# Patient Record
Sex: Female | Born: 1990 | Race: White | Hispanic: No | Marital: Single | Smoking: Former smoker
Health system: Southern US, Community
[De-identification: ages and names within clinical notes are randomized; demographics above are authoritative.]

## PROBLEM LIST (undated history)

## (undated) ENCOUNTER — Inpatient Hospital Stay (HOSPITAL_COMMUNITY): Payer: Self-pay

## (undated) ENCOUNTER — Inpatient Hospital Stay (HOSPITAL_COMMUNITY): Payer: Managed Care, Other (non HMO)

## (undated) DIAGNOSIS — E669 Obesity, unspecified: Secondary | ICD-10-CM

## (undated) DIAGNOSIS — F329 Major depressive disorder, single episode, unspecified: Secondary | ICD-10-CM

## (undated) DIAGNOSIS — F32A Depression, unspecified: Secondary | ICD-10-CM

## (undated) DIAGNOSIS — F419 Anxiety disorder, unspecified: Secondary | ICD-10-CM

## (undated) DIAGNOSIS — R519 Headache, unspecified: Secondary | ICD-10-CM

## (undated) DIAGNOSIS — R87629 Unspecified abnormal cytological findings in specimens from vagina: Secondary | ICD-10-CM

## (undated) DIAGNOSIS — K047 Periapical abscess without sinus: Secondary | ICD-10-CM

## (undated) DIAGNOSIS — E282 Polycystic ovarian syndrome: Secondary | ICD-10-CM

## (undated) DIAGNOSIS — F319 Bipolar disorder, unspecified: Secondary | ICD-10-CM

## (undated) DIAGNOSIS — B999 Unspecified infectious disease: Secondary | ICD-10-CM

## (undated) HISTORY — PX: OTHER SURGICAL HISTORY: SHX169

## (undated) HISTORY — PX: TONSILLECTOMY: SUR1361

---
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
## 2012-06-14 ENCOUNTER — Emergency Department: Payer: Self-pay | Admitting: Emergency Medicine

## 2012-07-16 ENCOUNTER — Encounter (HOSPITAL_COMMUNITY): Payer: Self-pay | Admitting: *Deleted

## 2012-07-16 ENCOUNTER — Inpatient Hospital Stay (HOSPITAL_COMMUNITY)
Admission: AD | Admit: 2012-07-16 | Discharge: 2012-07-16 | Disposition: A | Discharge status: Home / Self Care | Payer: Managed Care, Other (non HMO) | Source: Ambulatory Visit | Attending: Obstetrics and Gynecology | Admitting: Obstetrics and Gynecology

## 2012-07-16 ENCOUNTER — Inpatient Hospital Stay (HOSPITAL_COMMUNITY): Payer: Managed Care, Other (non HMO)

## 2012-07-16 DIAGNOSIS — R109 Unspecified abdominal pain: Secondary | ICD-10-CM

## 2012-07-16 DIAGNOSIS — O99891 Other specified diseases and conditions complicating pregnancy: Secondary | ICD-10-CM | POA: Insufficient documentation

## 2012-07-16 DIAGNOSIS — O9989 Other specified diseases and conditions complicating pregnancy, childbirth and the puerperium: Secondary | ICD-10-CM

## 2012-07-16 DIAGNOSIS — N949 Unspecified condition associated with female genital organs and menstrual cycle: Secondary | ICD-10-CM | POA: Insufficient documentation

## 2012-07-16 HISTORY — DX: Polycystic ovarian syndrome: E28.2

## 2012-07-16 HISTORY — DX: Obesity, unspecified: E66.9

## 2012-07-16 HISTORY — DX: Periapical abscess without sinus: K04.7

## 2012-07-16 LAB — URINE MICROSCOPIC-ADD ON

## 2012-07-16 LAB — URINALYSIS, ROUTINE W REFLEX MICROSCOPIC
Bilirubin Urine: NEGATIVE
Glucose, UA: NEGATIVE mg/dL
Hgb urine dipstick: NEGATIVE
Nitrite: NEGATIVE
Specific Gravity, Urine: >1.03 — ABNORMAL HIGH (ref 1.005–1.030)
pH: 6 (ref 5.0–8.0)

## 2012-07-16 LAB — CBC
MCV: 87.6 fL (ref 78.0–100.0)
Platelets: 364 10*3/uL (ref 150–400)
RBC: 4.29 MIL/uL (ref 3.87–5.11)
WBC: 10.5 10*3/uL (ref 4.0–10.5)

## 2012-07-16 LAB — WET PREP, GENITAL: Yeast Wet Prep HPF POC: NONE SEEN

## 2012-07-16 NOTE — MAU Provider Note (Signed)
History     CSN: [id number]  Arrival date and time: 07/16/12 1251   First Provider Initiated Contact with Patient 07/16/12 1333      Chief Complaint  Patient presents with  . Abdominal Cramping  . Vaginal swelling    HPI 22 y.o. G1P0 at [redacted]w[redacted]d by LMP with cramping, vaginal discharge, itching and irritation. No bleeding.  Past Medical History  Diagnosis Date  . Obesity   . PCOS (polycystic ovarian syndrome)     "When I was young". No medical intervention.  . Infected tooth     Past Surgical History  Procedure Laterality Date  . Tonsillectomy      History reviewed. No pertinent family history.  History  Substance Use Topics  . Smoking status: Former Smoker    Types: Cigarettes  . Smokeless tobacco: Never Used  . Alcohol Use: Yes     Comment: Rare    Allergies: No Known Allergies  Prescriptions prior to admission  Medication Sig Dispense Refill  . [DISCONTINUED] Aspirin-Salicylamide-Caffeine (BC HEADACHE POWDER PO) Take 1 Package by mouth 4 (four) times daily as needed (toothache).        Review of Systems  Constitutional: Negative.   Respiratory: Negative.   Cardiovascular: Negative.   Gastrointestinal: Positive for abdominal pain. Negative for nausea, vomiting, diarrhea and constipation.  Genitourinary: Negative for dysuria, urgency, frequency, hematuria and flank pain.       Negative for vaginal bleeding, + cramping  Musculoskeletal: Negative.   Neurological: Negative.   Psychiatric/Behavioral: Negative.    Physical Exam   Blood pressure 116/60, pulse 88, temperature 98.9 F (37.2 C), temperature source Oral, resp. rate 18, height 5\' 6"  (1.676 m), weight 261 lb (118.389 kg), last menstrual period 06/09/2012.  Physical Exam  Nursing note and vitals reviewed. Constitutional: She is oriented to person, place, and time. She appears well-developed and well-nourished. No distress.  Obese   HENT:  Head: Normocephalic and atraumatic.  Cardiovascular:  Normal rate.   Respiratory: Effort normal. No respiratory distress.  GI: Soft. She exhibits no distension and no mass. There is no tenderness. There is no rebound and no guarding.  Genitourinary: There is no rash or lesion on the right labia. There is no rash or lesion on the left labia. Uterus is not deviated, not enlarged, not fixed and not tender. Cervix exhibits no motion tenderness, no discharge and no friability. Right adnexum displays no mass, no tenderness and no fullness. Left adnexum displays no mass, no tenderness and no fullness. No erythema, tenderness or bleeding around the vagina. Vaginal discharge (white, bubbly) found.  Neurological: She is alert and oriented to person, place, and time.  Skin: Skin is warm and dry.  Psychiatric: She has a normal mood and affect.    MAU Course  Procedures  Results for orders placed during the hospital encounter of 07/16/12 (from the past 72 hour(s))  URINALYSIS, ROUTINE W REFLEX MICROSCOPIC     Status: Abnormal   Collection Time    07/16/12  1:00 PM      Result Value Range   Color, Urine YELLOW  YELLOW   APPearance HAZY (*) CLEAR   Specific Gravity, Urine >1.030 (*) 1.005 - 1.030   pH 6.0  5.0 - 8.0   Glucose, UA NEGATIVE  NEGATIVE mg/dL   Hgb urine dipstick NEGATIVE  NEGATIVE   Bilirubin Urine NEGATIVE  NEGATIVE   Ketones, ur NEGATIVE  NEGATIVE mg/dL   Protein, ur NEGATIVE  NEGATIVE mg/dL   Urobilinogen, UA 0.2  0.0 - 1.0 mg/dL   Nitrite NEGATIVE  NEGATIVE   Leukocytes, UA SMALL (*) NEGATIVE  URINE MICROSCOPIC-ADD ON     Status: Abnormal   Collection Time    07/16/12  1:00 PM      Result Value Range   Squamous Epithelial / LPF FEW (*) RARE   WBC, UA 3-6  <3 WBC/hpf   RBC / HPF 0-2  <3 RBC/hpf   Bacteria, UA MANY (*) RARE   Urine-Other MUCOUS PRESENT    POCT PREGNANCY, URINE     Status: Abnormal   Collection Time    07/16/12  1:22 PM      Result Value Range   Preg Test, Ur POSITIVE (*) NEGATIVE   Comment:            THE  SENSITIVITY OF THIS     METHODOLOGY IS >24 mIU/mL  WET PREP, GENITAL     Status: Abnormal   Collection Time    07/16/12  1:35 PM      Result Value Range   Yeast Wet Prep HPF POC NONE SEEN  NONE SEEN   Trich, Wet Prep NONE SEEN  NONE SEEN   Clue Cells Wet Prep HPF POC NONE SEEN  NONE SEEN   WBC, Wet Prep HPF POC MANY (*) NONE SEEN   Comment: MANY BACTERIA SEEN  CBC     Status: None   Collection Time    07/16/12  1:36 PM      Result Value Range   WBC 10.5  4.0 - 10.5 K/uL   RBC 4.29  3.87 - 5.11 MIL/uL   Hemoglobin 12.8  12.0 - 15.0 g/dL   HCT 78.2  95.6 - 21.3 %   MCV 87.6  78.0 - 100.0 fL   MCH 29.8  26.0 - 34.0 pg   MCHC 34.0  30.0 - 36.0 g/dL   RDW 08.6  57.8 - 46.9 %   Platelets 364  150 - 400 K/uL  HCG, QUANTITATIVE, PREGNANCY     Status: Abnormal   Collection Time    07/16/12  1:36 PM      Result Value Range   hCG, Beta Chain, Quant, S 1080 (*) <5 mIU/mL   Comment:              GEST. AGE      CONC.  (mIU/mL)       <=1 WEEK        5 - 50         2 WEEKS       50 - 500         3 WEEKS       100 - 10,000         4 WEEKS     1,000 - 30,000         5 WEEKS     3,500 - 115,000       6-8 WEEKS     12,000 - 270,000        12 WEEKS     15,000 - 220,000                FEMALE AND NON-PREGNANT FEMALE:         LESS THAN 5 mIU/mL     Assessment and Plan   1. Abdominal pain in pregnancy, antepartum   Precautions rev'd, f/u in 48 hours for repeat quant    Medication List    STOP taking these medications  BC HEADACHE POWDER PO        Follow-up Information   Follow up with [facility] [place] [facility]MISS[facility] On 07/19/2012. (for repeat labs)    Contact information:   [address] [id number] [place] [address] [phone]        [facility] 07/16/2012, 2:48 PM

## 2012-07-16 NOTE — MAU Note (Signed)
+   HPT X 2. C/O abdominal cramping, fatigue, constipation. C/O vaginal "swelling and itching." Burns with urination.

## 2012-07-17 LAB — URINE CULTURE

## 2012-07-17 LAB — GC/CHLAMYDIA PROBE AMP: GC Probe RNA: NEGATIVE

## 2012-07-17 NOTE — MAU Provider Note (Signed)
Attestation of Attending Supervision of Advanced Practitioner (CNM/NP): Evaluation and management procedures were performed by the Advanced Practitioner under my supervision and collaboration.  I have reviewed the Advanced Practitioner's note and chart, and I agree with the management and plan.  Sarha Bartelt 07/17/2012 7:21 AM

## 2012-07-21 ENCOUNTER — Ambulatory Visit (HOSPITAL_COMMUNITY)
Admission: AD | Admit: 2012-07-21 | Discharge: 2012-07-21 | Disposition: A | Discharge status: Home / Self Care | Payer: Managed Care, Other (non HMO) | Source: Ambulatory Visit | Attending: Obstetrics & Gynecology | Admitting: Obstetrics & Gynecology

## 2012-07-21 DIAGNOSIS — R109 Unspecified abdominal pain: Secondary | ICD-10-CM | POA: Insufficient documentation

## 2012-07-21 DIAGNOSIS — O210 Mild hyperemesis gravidarum: Secondary | ICD-10-CM | POA: Insufficient documentation

## 2012-07-21 DIAGNOSIS — O99891 Other specified diseases and conditions complicating pregnancy: Secondary | ICD-10-CM | POA: Insufficient documentation

## 2012-07-21 DIAGNOSIS — Z3201 Encounter for pregnancy test, result positive: Secondary | ICD-10-CM

## 2012-07-21 NOTE — MAU Note (Signed)
Here for F/U. Denies bleeding. States she is still cramping intermittently like a period.

## 2012-07-22 ENCOUNTER — Ambulatory Visit (HOSPITAL_COMMUNITY)
Admission: AD | Admit: 2012-07-22 | Discharge: 2012-07-22 | Disposition: A | Discharge status: Home / Self Care | Payer: Managed Care, Other (non HMO) | Source: Ambulatory Visit | Attending: Obstetrics & Gynecology | Admitting: Obstetrics & Gynecology

## 2012-07-22 ENCOUNTER — Encounter (HOSPITAL_COMMUNITY): Payer: Self-pay | Admitting: *Deleted

## 2012-07-22 DIAGNOSIS — Z3689 Encounter for other specified antenatal screening: Secondary | ICD-10-CM

## 2012-07-22 DIAGNOSIS — O26899 Other specified pregnancy related conditions, unspecified trimester: Secondary | ICD-10-CM

## 2012-07-22 DIAGNOSIS — O219 Vomiting of pregnancy, unspecified: Secondary | ICD-10-CM

## 2012-07-22 DIAGNOSIS — O9989 Other specified diseases and conditions complicating pregnancy, childbirth and the puerperium: Secondary | ICD-10-CM

## 2012-07-22 DIAGNOSIS — R109 Unspecified abdominal pain: Secondary | ICD-10-CM

## 2012-07-22 DIAGNOSIS — O3680X0 Pregnancy with inconclusive fetal viability, not applicable or unspecified: Secondary | ICD-10-CM

## 2012-07-22 LAB — URINALYSIS, ROUTINE W REFLEX MICROSCOPIC
Glucose, UA: NEGATIVE mg/dL
Hgb urine dipstick: NEGATIVE
Protein, ur: NEGATIVE mg/dL
pH: 8 (ref 5.0–8.0)

## 2012-07-22 LAB — URINE MICROSCOPIC-ADD ON

## 2012-07-22 MED ORDER — ONDANSETRON HCL 4 MG PO TABS: 8.0000 mg | ORAL_TABLET | Freq: Once | ORAL | Status: DC

## 2012-07-22 MED ORDER — VITAMIN B-6 100 MG PO TABS: 100.0000 mg | ORAL_TABLET | Freq: Every day | ORAL | Status: DC

## 2012-07-22 MED ORDER — PROMETHAZINE HCL 25 MG PO TABS: 25.0000 mg | ORAL_TABLET | Freq: Four times a day (QID) | ORAL | Status: DC | PRN

## 2012-07-22 MED ORDER — DOXYLAMINE SUCCINATE (SLEEP) 25 MG PO TABS: 25.0000 mg | ORAL_TABLET | Freq: Every evening | ORAL | Status: DC | PRN

## 2012-07-22 NOTE — MAU Provider Note (Signed)
History     CSN: [id number]  Arrival date and time: 07/22/12 1006   First Provider Initiated Contact with Patient 07/22/12 1056      Chief Complaint  Patient presents with  . Morning Sickness  . Headache  . Abdominal Cramping   HPI This is a 22 y.o. female at [redacted]w[redacted]d  Who presents with c/o vomiting yesterday. States has nausea today with some headache and cramping. Was seen here 6 days ago for cramping and had a full workup. Quants increased appropriately. Has not tried anything for nausea.   RN Note: Patient states she started vomiting yesterday about 1200. No vomiting at this time but feels nauseated, abdominal cramping and headache  OB History   Grav Para Term Preterm Abortions TAB SAB Ect Mult Living   1         0      Past Medical History  Diagnosis Date  . Obesity   . PCOS (polycystic ovarian syndrome)     "When I was young". No medical intervention.  . Infected tooth     Past Surgical History  Procedure Laterality Date  . Tonsillectomy      Family History  Problem Relation Age of Onset  . Diabetes Paternal Grandmother   . Diabetes Paternal Grandfather     History  Substance Use Topics  . Smoking status: Former Smoker    Types: Cigarettes  . Smokeless tobacco: Never Used  . Alcohol Use: Yes     Comment: Rare    Allergies: No Known Allergies  Prescriptions prior to admission  Medication Sig Dispense Refill  . acetaminophen (TYLENOL) 500 MG tablet Take 2,000 mg by mouth once.      . calcium carbonate (TUMS - DOSED IN MG ELEMENTAL CALCIUM) 500 MG chewable tablet Chew 1-2 tablets by mouth 2 (two) times daily as needed for heartburn.      . Prenatal Vit-Fe Fumarate-FA (PRENATAL MULTIVITAMIN) TABS Take 1 tablet by mouth daily at 12 noon.        Review of Systems  Constitutional: Negative for fever, chills and malaise/fatigue.  Gastrointestinal: Positive for nausea, vomiting and abdominal pain. Negative for diarrhea and constipation.  Genitourinary:  Negative for dysuria.  Neurological: Negative for dizziness.   Physical Exam   Blood pressure 123/74, pulse 80, temperature 98.2 F (36.8 C), temperature source Oral, resp. rate 18, height 5' 6.5" (1.689 m), weight 115.486 kg (254 lb 9.6 oz), last menstrual period 06/09/2012, SpO2 98.00%.  Physical Exam  Constitutional: She is oriented to person, place, and time. She appears well-developed and well-nourished. No distress.  HENT:  Head: Normocephalic.  Cardiovascular: Normal rate.   Respiratory: Effort normal.  GI: Soft. She exhibits no distension. There is no tenderness. There is no rebound and no guarding.  Musculoskeletal: Normal range of motion.  Neurological: She is alert and oriented to person, place, and time.  Skin: Skin is warm and dry.  Psychiatric: She has a normal mood and affect.    MAU Course  Procedures  MDM Results for orders placed during the hospital encounter of 07/22/12 (from the past 24 hour(s))  URINALYSIS, ROUTINE W REFLEX MICROSCOPIC     Status: Abnormal   Collection Time    07/22/12 10:19 AM      Result Value Range   Color, Urine YELLOW  YELLOW   APPearance CLOUDY (*) CLEAR   Specific Gravity, Urine 1.025  1.005 - 1.030   pH 8.0  5.0 - 8.0   Glucose, UA NEGATIVE  NEGATIVE mg/dL   Hgb urine dipstick NEGATIVE  NEGATIVE   Bilirubin Urine NEGATIVE  NEGATIVE   Ketones, ur NEGATIVE  NEGATIVE mg/dL   Protein, ur NEGATIVE  NEGATIVE mg/dL   Urobilinogen, UA 0.2  0.0 - 1.0 mg/dL   Nitrite NEGATIVE  NEGATIVE   Leukocytes, UA SMALL (*) NEGATIVE  URINE MICROSCOPIC-ADD ON     Status: Abnormal   Collection Time    07/22/12 10:19 AM      Result Value Range   Squamous Epithelial / LPF MANY (*) RARE   WBC, UA 0-2  <3 WBC/hpf   Bacteria, UA FEW (*) RARE   Given Zofran for nausea with improvement.   Assessment and Plan  A:  SIUP at 108w1d        Nausea of pregnancy  P:  Discharge home         Medication List         acetaminophen 500 MG tablet   Commonly known as:  TYLENOL  Take 2,000 mg by mouth once.     calcium carbonate 500 MG chewable tablet  Commonly known as:  TUMS - dosed in mg elemental calcium  Chew 1-2 tablets by mouth 2 (two) times daily as needed for heartburn.     doxylamine (Sleep) 25 MG tablet  Commonly known as:  UNISOM  Take 1 tablet (25 mg total) by mouth at bedtime as needed for sleep.     prenatal multivitamin Tabs  Take 1 tablet by mouth daily at 12 noon.     promethazine 25 MG tablet  Commonly known as:  PHENERGAN  Take 1 tablet (25 mg total) by mouth every 6 (six) hours as needed for nausea.     pyridOXINE 100 MG tablet  Commonly known as:  VITAMIN B-6  Take 1 tablet (100 mg total) by mouth daily.         [facility] 07/22/2012, 10:56 AM

## 2012-07-22 NOTE — MAU Note (Signed)
Patient states she started vomiting yesterday about 1200. No vomiting at this time but feels nauseated, abdominal cramping and headache.

## 2012-07-23 NOTE — MAU Provider Note (Signed)
Attestation of Attending Supervision of Advanced Practitioner (CNM/NP): Evaluation and management procedures were performed by the Advanced Practitioner under my supervision and collaboration. I have reviewed the Advanced Practitioner's note and chart, and I agree with the management and plan.  Maily Debarge H. 1:32 PM   

## 2012-07-25 ENCOUNTER — Encounter (HOSPITAL_COMMUNITY): Payer: Self-pay | Admitting: Advanced Practice Midwife

## 2012-07-25 ENCOUNTER — Ambulatory Visit (HOSPITAL_COMMUNITY)
Admit: 2012-07-25 | Discharge: 2012-07-25 | Disposition: A | Discharge status: Home / Self Care | Payer: Managed Care, Other (non HMO) | Attending: Advanced Practice Midwife | Admitting: Advanced Practice Midwife

## 2012-07-25 ENCOUNTER — Inpatient Hospital Stay (HOSPITAL_COMMUNITY)
Admission: AD | Admit: 2012-07-25 | Discharge: 2012-07-25 | Disposition: A | Discharge status: Home / Self Care | Payer: Managed Care, Other (non HMO) | Source: Ambulatory Visit | Attending: Obstetrics & Gynecology | Admitting: Obstetrics & Gynecology

## 2012-07-25 ENCOUNTER — Ambulatory Visit (HOSPITAL_COMMUNITY): Admit: 2012-07-25 | Payer: Managed Care, Other (non HMO)

## 2012-07-25 DIAGNOSIS — Z363 Encounter for antenatal screening for malformations: Secondary | ICD-10-CM

## 2012-07-25 DIAGNOSIS — O99891 Other specified diseases and conditions complicating pregnancy: Secondary | ICD-10-CM | POA: Insufficient documentation

## 2012-07-25 DIAGNOSIS — Z1389 Encounter for screening for other disorder: Secondary | ICD-10-CM

## 2012-07-25 DIAGNOSIS — Z3490 Encounter for supervision of normal pregnancy, unspecified, unspecified trimester: Secondary | ICD-10-CM

## 2012-07-25 DIAGNOSIS — Z3201 Encounter for pregnancy test, result positive: Secondary | ICD-10-CM

## 2012-07-25 NOTE — MAU Provider Note (Signed)
Attestation of Attending Supervision of Advanced Practitioner (PA/CNM/NP): Evaluation and management procedures were performed by the Advanced Practitioner under my supervision and collaboration.  I have reviewed the Advanced Practitioner's note and chart, and I agree with the management and plan.  Klaryssa Fauth, MD, FACOG Attending Obstetrician & Gynecologist Faculty Practice, [facility]  

## 2012-07-25 NOTE — MAU Provider Note (Signed)
22 y.o. G1P0 at [redacted]w[redacted]d here for repeat u/s for viability, no complaints today. Denies pain or bleeding.   U/S today shows 5.5 week IUP, + FHR.   A/P: 22 y.o. G1P0 at [redacted]w[redacted]d with viable IUP Start prenatal care ASAP Precautions rev'd  Malva Diesing 07/25/12, 4:43 PM

## 2012-08-02 ENCOUNTER — Emergency Department: Payer: Self-pay | Admitting: Emergency Medicine

## 2012-08-02 LAB — COMPREHENSIVE METABOLIC PANEL
Albumin: 3.7 g/dL (ref 3.4–5.0)
Anion Gap: 7 (ref 7–16)
Bilirubin,Total: 0.4 mg/dL (ref 0.2–1.0)
Calcium, Total: 9.4 mg/dL (ref 8.5–10.1)
Chloride: 107 mmol/L (ref 98–107)
Co2: 23 mmol/L (ref 21–32)
EGFR (African American): >60
Osmolality: 272 (ref 275–301)
Potassium: 3.3 mmol/L — ABNORMAL LOW (ref 3.5–5.1)
SGOT(AST): 14 U/L — ABNORMAL LOW (ref 15–37)
SGPT (ALT): 12 U/L (ref 12–78)
Sodium: 137 mmol/L (ref 136–145)

## 2012-08-02 LAB — CBC WITH DIFFERENTIAL/PLATELET
Basophil #: 0.1 10*3/uL (ref 0.0–0.1)
Basophil %: 0.4 %
Eosinophil #: 0.1 10*3/uL (ref 0.0–0.7)
HCT: 41.1 % (ref 35.0–47.0)
HGB: 13.3 g/dL (ref 12.0–16.0)
Lymphocyte #: 1.8 10*3/uL (ref 1.0–3.6)
Lymphocyte %: 11 %
MCV: 88 fL (ref 80–100)
Monocyte #: 1 x10 3/mm — ABNORMAL HIGH (ref 0.2–0.9)
Monocyte %: 5.9 %
Neutrophil %: 81.9 %
Platelet: 395 10*3/uL (ref 150–440)
RBC: 4.66 10*6/uL (ref 3.80–5.20)

## 2012-08-02 LAB — HCG, QUANTITATIVE, PREGNANCY: Beta Hcg, Quant.: 48696 m[IU]/mL — ABNORMAL HIGH

## 2012-08-02 LAB — URINALYSIS, COMPLETE
Bilirubin,UR: NEGATIVE
Blood: NEGATIVE
Glucose,UR: NEGATIVE mg/dL (ref 0–75)
Nitrite: NEGATIVE
Ph: 8 (ref 4.5–8.0)
Specific Gravity: 1.026 (ref 1.003–1.030)

## 2012-08-10 ENCOUNTER — Encounter (HOSPITAL_COMMUNITY): Payer: Self-pay | Admitting: *Deleted

## 2012-08-10 ENCOUNTER — Inpatient Hospital Stay (HOSPITAL_COMMUNITY)
Admission: AD | Admit: 2012-08-10 | Discharge: 2012-08-10 | Disposition: A | Discharge status: Home / Self Care | Payer: Managed Care, Other (non HMO) | Source: Ambulatory Visit | Attending: Obstetrics and Gynecology | Admitting: Obstetrics and Gynecology

## 2012-08-10 DIAGNOSIS — O219 Vomiting of pregnancy, unspecified: Secondary | ICD-10-CM

## 2012-08-10 DIAGNOSIS — R42 Dizziness and giddiness: Secondary | ICD-10-CM | POA: Insufficient documentation

## 2012-08-10 DIAGNOSIS — O210 Mild hyperemesis gravidarum: Secondary | ICD-10-CM

## 2012-08-10 DIAGNOSIS — R109 Unspecified abdominal pain: Secondary | ICD-10-CM | POA: Insufficient documentation

## 2012-08-10 LAB — URINALYSIS, ROUTINE W REFLEX MICROSCOPIC
Ketones, ur: 15 mg/dL — AB
Leukocytes, UA: NEGATIVE
Nitrite: NEGATIVE
Protein, ur: NEGATIVE mg/dL
Urobilinogen, UA: 0.2 mg/dL (ref 0.0–1.0)

## 2012-08-10 MED ORDER — PROMETHAZINE HCL 25 MG/ML IJ SOLN
25.0000 mg | Freq: Once | INTRAMUSCULAR | Status: AC
  Administered 2012-08-10: 25 mg via INTRAVENOUS
  Filled 2012-08-10: qty 1

## 2012-08-10 MED ORDER — DEXTROSE 5 % IN LACTATED RINGERS IV BOLUS
1000.0000 mL | Freq: Once | INTRAVENOUS | Status: AC
  Administered 2012-08-10: 1000 mL via INTRAVENOUS

## 2012-08-10 MED ORDER — ONDANSETRON 8 MG/NS 50 ML IVPB
8.0000 mg | Freq: Once | INTRAVENOUS | Status: AC
  Administered 2012-08-10: 8 mg via INTRAVENOUS
  Filled 2012-08-10: qty 8

## 2012-08-10 MED ORDER — ONDANSETRON 8 MG PO TBDP: 8.0000 mg | ORAL_TABLET | Freq: Three times a day (TID) | ORAL | Status: DC | PRN

## 2012-08-10 NOTE — MAU Provider Note (Signed)
  History     CSN: [id number]  Arrival date and time: 08/10/12 1831   First Provider Initiated Contact with Patient 08/10/12 1846      Chief Complaint  Patient presents with  . Emesis During Pregnancy   HPI 22 y.o. G1P0 at [redacted]w[redacted]d with n/v, ongoing this preg, worse today. Was better with Zofran, but ran out. Actively vomiting.   Past Medical History  Diagnosis Date  . Obesity   . PCOS (polycystic ovarian syndrome)     "When I was young". No medical intervention.  . Infected tooth     Past Surgical History  Procedure Laterality Date  . Tonsillectomy      Family History  Problem Relation Age of Onset  . Diabetes Paternal Grandmother   . Diabetes Paternal Grandfather     History  Substance Use Topics  . Smoking status: Former Smoker    Types: Cigarettes  . Smokeless tobacco: Never Used  . Alcohol Use: Yes     Comment: Rare    Allergies: No Known Allergies  Prescriptions prior to admission  Medication Sig Dispense Refill  . acetaminophen (TYLENOL) 500 MG tablet Take 2,000 mg by mouth once.      . calcium carbonate (TUMS - DOSED IN MG ELEMENTAL CALCIUM) 500 MG chewable tablet Chew 1-2 tablets by mouth 2 (two) times daily as needed for heartburn.      . ondansetron (ZOFRAN) 4 MG tablet Take 4 mg by mouth every 8 (eight) hours as needed for nausea.      Marland Kitchen doxylamine, Sleep, (UNISOM) 25 MG tablet Take 1 tablet (25 mg total) by mouth at bedtime as needed for sleep.  30 tablet  0  . Prenatal Vit-Fe Fumarate-FA (PRENATAL MULTIVITAMIN) TABS Take 1 tablet by mouth daily at 12 noon.      . pyridOXINE (VITAMIN B-6) 100 MG tablet Take 1 tablet (100 mg total) by mouth daily.  30 tablet  0    Review of Systems  Constitutional: Negative.   Respiratory: Negative.   Cardiovascular: Negative.   Gastrointestinal: Positive for nausea, vomiting and abdominal pain. Negative for diarrhea and constipation.  Genitourinary: Negative for dysuria, urgency, frequency, hematuria and flank  pain.  Musculoskeletal: Negative.   Neurological: Positive for dizziness.  Psychiatric/Behavioral: Negative.    Physical Exam   Blood pressure 130/77, pulse 97, temperature 97.8 F (36.6 C), temperature source Oral, resp. rate 20, height 5\' 6"  (1.676 m), weight 254 lb (115.214 kg), last menstrual period 06/09/2012.  Physical Exam  Nursing note and vitals reviewed. Constitutional: She is oriented to person, place, and time. She appears well-developed and well-nourished. Distressed: actively vomiting.  Musculoskeletal: Normal range of motion.  Neurological: She is alert and oriented to person, place, and time.  Skin: Skin is dry.  Psychiatric: She has a normal mood and affect.    MAU Course  Procedures UA pending  Bolus 1 L D5LR, Zofran 8 mg and Phenergan 25 mg IV 2029: Patient is feeling better, and would like to go home. She needs a RX for zofran  Assessment and Plan  Care assumed by Thressa Sheller, CNM  1. Nausea and vomiting in pregnancy prior to [redacted] weeks gestation    RX: zofran 8mg  q 8 hours PRN #30 0RF Start [facility] as soon as possible First trimester danger signs reviewed   FRAZIER,NATALIE 08/10/2012, 7:28 PM

## 2012-08-10 NOTE — MAU Note (Addendum)
Pt in c/o nausea and vomiting since this morning.  States she is unable to keep anything down.  Reports dizziness and lightheadedness.  Denies any bleeding or lof.  [facility] 03/22/13.  Was given nausea medicine last week at [facility], but has now ran out.  Reports upper abdominal pain from vomiting.

## 2012-08-10 NOTE — MAU Provider Note (Signed)
Attestation of Attending Supervision of Advanced Practitioner: Evaluation and management procedures were performed by the PA/NP/CNM/OB Fellow under my supervision/collaboration. Chart reviewed and agree with management and plan.  Dimple Bastyr V 08/10/2012 9:40 PM  Pap done, will follow up results and manage accordingly. Mammogram scheduled Routine preventative health maintenance measures emphasized

## 2012-08-17 ENCOUNTER — Encounter (HOSPITAL_COMMUNITY): Payer: Self-pay | Admitting: Family

## 2012-08-17 ENCOUNTER — Inpatient Hospital Stay (HOSPITAL_COMMUNITY)
Admission: AD | Admit: 2012-08-17 | Discharge: 2012-08-17 | Disposition: A | Discharge status: Home / Self Care | Payer: Managed Care, Other (non HMO) | Source: Ambulatory Visit | Attending: Family Medicine | Admitting: Family Medicine

## 2012-08-17 DIAGNOSIS — O219 Vomiting of pregnancy, unspecified: Secondary | ICD-10-CM

## 2012-08-17 DIAGNOSIS — O210 Mild hyperemesis gravidarum: Secondary | ICD-10-CM | POA: Insufficient documentation

## 2012-08-17 LAB — URINALYSIS, ROUTINE W REFLEX MICROSCOPIC
Bilirubin Urine: NEGATIVE
Ketones, ur: >80 mg/dL — AB
Nitrite: NEGATIVE
Protein, ur: NEGATIVE mg/dL

## 2012-08-17 MED ORDER — LACTATED RINGERS IV SOLN
INTRAVENOUS | Status: DC
Start: 2012-08-17 — End: 2012-08-17
  Administered 2012-08-17: 13:00:00 via INTRAVENOUS

## 2012-08-17 MED ORDER — PROMETHAZINE HCL 25 MG PO TABS: 25.0000 mg | ORAL_TABLET | Freq: Four times a day (QID) | ORAL | Status: DC | PRN

## 2012-08-17 MED ORDER — ONDANSETRON 8 MG PO TBDP
8.0000 mg | ORAL_TABLET | Freq: Once | ORAL | Status: AC
  Administered 2012-08-17: 8 mg via ORAL
  Filled 2012-08-17: qty 1

## 2012-08-17 NOTE — MAU Provider Note (Signed)
History     CSN: [id number]  Arrival date and time: 08/17/12 1100   First Provider Initiated Contact with Patient 08/17/12 1136      No chief complaint on file.  HPI This is a 22 y.o. female at [redacted]w[redacted]d who presents with c/o nausea and vomiting.  See RN Note. Has not started [facility] yet, but will probably go to [facility].   RN Note: Patient presents to MAU with c/o N/V since Saturday 0400 when she ran out of her sister's Rx for Zofran. Reports she was taking 4mg  x 2 once daily and was effectively managing her N/V.  Reports she has an Rx for Zofran but will be unable to pick that up until tomorrow morning d/t financial constraints. Denies VB.       OB History   Grav Para Term Preterm Abortions TAB SAB Ect Mult Living   1         0      Past Medical History  Diagnosis Date  . Obesity   . PCOS (polycystic ovarian syndrome)     "When I was young". No medical intervention.  . Infected tooth     Past Surgical History  Procedure Laterality Date  . Tonsillectomy      Family History  Problem Relation Age of Onset  . Diabetes Paternal Grandmother   . Diabetes Paternal Grandfather     History  Substance Use Topics  . Smoking status: Former Smoker    Types: Cigarettes  . Smokeless tobacco: Never Used  . Alcohol Use: Yes     Comment: Rare    Allergies: No Known Allergies  Prescriptions prior to admission  Medication Sig Dispense Refill  . anti-nausea (EMETROL) solution Take 10 mLs by mouth every 15 (fifteen) minutes as needed for nausea or vomiting.      . ondansetron (ZOFRAN) 4 MG tablet Take 4 mg by mouth every 8 (eight) hours as needed for nausea.      . Prenatal Vit-Fe Fumarate-FA (PRENATAL MULTIVITAMIN) TABS Take 1 tablet by mouth daily at 12 noon.        Review of Systems  Constitutional: Positive for malaise/fatigue. Negative for fever and chills.  Gastrointestinal: Positive for nausea and vomiting. Negative for abdominal pain, diarrhea and constipation.   Neurological: Positive for dizziness and weakness.   Physical Exam   Blood pressure 134/64, pulse 109, temperature 98 F (36.7 C), temperature source Oral, resp. rate 18, last menstrual period 06/09/2012, SpO2 100.00%.  Physical Exam  Constitutional: She is oriented to person, place, and time. She appears well-developed and well-nourished. No distress (but anxious).  Cardiovascular: Normal rate.   Respiratory: Effort normal.  GI: Soft. There is no tenderness.  Musculoskeletal: Normal range of motion.  Neurological: She is alert and oriented to person, place, and time.  Skin: Skin is warm and dry.  Psychiatric: She has a normal mood and affect.   Results for orders placed during the hospital encounter of 08/17/12 (from the past 24 hour(s))  URINALYSIS, ROUTINE W REFLEX MICROSCOPIC     Status: Abnormal   Collection Time    08/17/12 11:52 AM      Result Value Range   Color, Urine YELLOW  YELLOW   APPearance HAZY (*) CLEAR   Specific Gravity, Urine 1.020  1.005 - 1.030   pH 7.5  5.0 - 8.0   Glucose, UA NEGATIVE  NEGATIVE mg/dL   Hgb urine dipstick NEGATIVE  NEGATIVE   Bilirubin Urine NEGATIVE  NEGATIVE  Ketones, ur >80 (*) NEGATIVE mg/dL   Protein, ur NEGATIVE  NEGATIVE mg/dL   Urobilinogen, UA 0.2  0.0 - 1.0 mg/dL   Nitrite NEGATIVE  NEGATIVE   Leukocytes, UA SMALL (*) NEGATIVE  URINE MICROSCOPIC-ADD ON     Status: Abnormal   Collection Time    08/17/12 11:52 AM      Result Value Range   Squamous Epithelial / LPF MANY (*) RARE   WBC, UA 7-10  <3 WBC/hpf   RBC / HPF 0-2  <3 RBC/hpf   Bacteria, UA MANY (*) RARE    MAU Course  Procedures  MDM IV bolus of 1 liter given. Zofran given with complete relief. Wants to leave to go eat.   Assessment and Plan  A:  SIUP at [redacted]w[redacted]d       Nausea and vomiting, has not filled Rx  P:  Discharge home       Rx for Phenergan ($4 list) given.       Urged to fill Rx'es  [facility] 08/17/2012, 12:09 PM

## 2012-08-17 NOTE — MAU Provider Note (Signed)
Chart reviewed and agree with management and plan.  

## 2012-08-17 NOTE — MAU Note (Signed)
Patient presents to MAU with c/o N/V since Saturday 0400 when she ran out of her sister's Rx for Zofran. Reports she was taking 4mg  x 2 once daily and was effectively managing her N/V.  Reports she has an Rx for Zofran but will be unable to pick that up until tomorrow morning d/t financial constraints. Denies VB.

## 2012-08-18 LAB — URINE CULTURE: Colony Count: 40000

## 2012-08-19 ENCOUNTER — Other Ambulatory Visit: Payer: Self-pay | Admitting: Advanced Practice Midwife

## 2012-08-26 ENCOUNTER — Other Ambulatory Visit: Payer: Self-pay | Admitting: Obstetrics & Gynecology

## 2012-08-26 DIAGNOSIS — O3680X0 Pregnancy with inconclusive fetal viability, not applicable or unspecified: Secondary | ICD-10-CM

## 2012-08-27 ENCOUNTER — Ambulatory Visit (INDEPENDENT_AMBULATORY_CARE_PROVIDER_SITE_OTHER): Payer: Managed Care, Other (non HMO)

## 2012-08-27 ENCOUNTER — Ambulatory Visit (INDEPENDENT_AMBULATORY_CARE_PROVIDER_SITE_OTHER): Payer: Managed Care, Other (non HMO) | Admitting: Advanced Practice Midwife

## 2012-08-27 ENCOUNTER — Other Ambulatory Visit: Payer: Self-pay | Admitting: Obstetrics & Gynecology

## 2012-08-27 DIAGNOSIS — O3680X0 Pregnancy with inconclusive fetal viability, not applicable or unspecified: Secondary | ICD-10-CM

## 2012-08-27 DIAGNOSIS — O021 Missed abortion: Secondary | ICD-10-CM

## 2012-08-27 LAB — CBC
HCT: 40.4 % (ref 36.0–46.0)
MCHC: 33.7 g/dL (ref 30.0–36.0)
MCV: 89.6 fL (ref 78.0–100.0)
Platelets: 409 10*3/uL — ABNORMAL HIGH (ref 150–400)
RDW: 14.4 % (ref 11.5–15.5)
WBC: 11.1 10*3/uL — ABNORMAL HIGH (ref 4.0–10.5)

## 2012-08-27 NOTE — Patient Instructions (Addendum)
Dilation and Curettage or Vacuum Curettage Dilation and curettage (D&C) and vacuum curettage are minor procedures. A D&C involves stretching (dilation) the cervix and scraping (curettage) the inside lining of the womb (uterus). During a D&C, tissue is gently scraped from the inside lining of the uterus. During a vacuum curettage, the lining and tissue in the uterus are removed with the use of gentle suction. Curettage may be performed for diagnostic or therapeutic purposes. As a diagnostic procedure, curettage is performed for the purpose of examining tissues from the uterus. Tissue examination may help determine causes or treatment options for symptoms. A diagnostic curettage may be performed for the following symptoms:  Irregular bleeding in the uterus.  Bleeding with the development of clots.  Spotting between menstrual periods.  Prolonged menstrual periods.  Bleeding after menopause.  No menstrual period (amenorrhea).  A change in size and shape of the uterus. A therapeutic curettage is performed to remove tissue, blood, or a contraceptive device. Therapeutic curettage may be performed for the following conditions:   Removal of an IUD (intrauterine device).  Removal of retained placenta after giving birth. Retained placenta can cause bleeding severe enough to require transfusions or an infection.  Abortion.  Miscarriage.  Removal of polyps inside the uterus.  Removal of uncommon types of fibroids (noncancerous lumps). LET YOUR CAREGIVER KNOW ABOUT:   Allergies to food or medicine.  Medicines taken, including vitamins, herbs, eyedrops, over-the-counter medicines, and creams.  Use of steroids (by mouth or creams).  Previous problems with anesthetics or numbing medicines.  History of bleeding problems or blood clots.  Previous surgery.  Other health problems, including diabetes and kidney problems.  Possibility of pregnancy, if this applies. RISKS AND COMPLICATIONS    Excessive bleeding.  Infection of the uterus.  Damage to the cervix.  Development of scar tissue (adhesions) inside the uterus, later causing abnormal amounts of menstrual bleeding.  Complications from the general anesthetic, if a general anesthetic is used.  Putting a hole (perforation) in the uterus. This is rare. BEFORE THE PROCEDURE   Eat and drink before the procedure only as directed by your caregiver.  Arrange for someone to take you home. PROCEDURE   This procedure may be done in a hospital, outpatient clinic, or caregiver's office.  You may be given a general anesthetic or a local anesthetic in and around the cervix.  You will lie on your back with your legs in stirrups.  There are two ways in which your cervix can be softened and dilated. These include:  Taking a medicine.  Having thin rods (laminaria) inserted into your cervix.  A curved tool (curette) will scrape cells from the inside lining of the uterus and will then be removed. This procedure usually takes about 15 to 30 minutes. AFTER THE PROCEDURE   You will rest in the recovery area until you are stable and are ready to go home.  You will need to have someone take you home.  You may feel sick to your stomach (nauseous) or throw up (vomit) if you had general anesthesia.  You may have a sore throat if a tube was placed in your throat during general anesthesia.  You may have light cramping and bleeding for 2 days to 2 weeks after the procedure.  Your uterus needs to make a new lining after the procedure. This may make your next period late. Document Released: 01/01/2005 Document Revised: 03/26/2011 Document Reviewed: 07/30/2008 [facility] Patient Information 2014 [place], [place]. Incomplete Miscarriage Miscarriages in pregnancy are  common. A miscarriage is a pregnancy that has ended before the twentieth week. You have had an incomplete miscarriage. Partial parts of the fetus or placenta (afterbirth)  remain behind. Sometimes further treatment is needed. The most common reason for further treatment is continued bleeding (hemorrhage). Tissue left behind may also become infected. Treatment usually is curettage. Curettage for an incomplete abortion is a procedure in which the remaining products of pregnancy are removed. This can be done by a simple sucking procedure (suction curettage). It can also be done by a simple scraping (curettage) of the inside of the uterus (womb). This may be done in the hospital or in the caregiver's office. This is only done when your caregiver knows the pregnancy has ended. This is determined by physical examination and a negative pregnancy test. It may also include an ultrasound to confirm a dead fetus. The ultrasound may also prove that products of the pregnancy remain in the uterus. If your cervix remains dilated and you are still passing clots and tissue, your caregiver may wish to watch you for a little while. Your caregiver may want to see if you are going to finish passing all of the remaining parts of the pregnancy. If the bleeding continues, they may proceed with curettage. WHY DO I FEEL THIS WAY Miscarriages can be a very emotional time for prospective mothers. This is not you or your partner's fault. The miscarriage did not occur because of a lack in you or your partner. Nearly all miscarriages occur because the pregnancy has started off wrongly. At least half of miscarried pregnancies have a chromosomal abnormality (almost always not inherited). Others may have developmental problems with the fetus or placentas. Problems may not show up even when the products miscarried are studied under the microscope. You can usually begin trying for another pregnancy as soon as your caregiver says it's okay. HOME CARE INSTRUCTIONS   Your caregiver may order bed rest (this means only getting up to use the bathroom). Your caregiver may allow you to continue light activity. If  curettage was not done at this time, but you require further treatment.  Keep track of the number of pads you use each day. Keep track of how saturated (soaked) they are. Record this information.  Do not use tampons. Do not douche or have sexual intercourse until approved by your caregiver.  It is very important to keep all follow-up appointments for re-evaluation and continuing management.  Women who have an Rh negative blood type (ie, A, B, AB, or O negative) need to receive a drug called Rh(D) immune globulin. This medicine helps protect future fetuses against problems that can occur if an Rh negative mother is carrying a baby who is Rh positive. SEEK IMMEDIATE MEDICAL CARE IF:   You experience severe cramps in your stomach, back, or abdomen.  You run an unexplained temperature (record these).  You pass large clots or tissue (save any tissue for your caregiver to inspect).  Your bleeding increases or you become light-headed, weak, or have fainting episodes. MAKE SURE YOU:   Understand these instructions.  Will watch your condition.  Will get help right away if you are not doing well or get worse. Document Released: 01/01/2005 Document Revised: 03/26/2011 Document Reviewed: 08/22/2007 [facility] Patient Information 2014 [place], [place].   OPTIONS:   1. Wait (for up to 2 weeks) to see if a natural miscarriage will happen.  If if does not, please call office to request either option 2 or 3:  2.  Cytotec induction:  You take tablets (called cytotec) which usually induce a miscarriage within 24-28 hours.  Sometimes 2 doses are needed.  3.  Surgical D&C:  Please see above information

## 2012-08-27 NOTE — Progress Notes (Signed)
Sara Carpenter 22 y.o. G1 at 10.3 weeks based on 5 week [place] at [facility] (+ [facility]HR at that visit) presents for her new OB u/s.  US showed fetus measureing 8 weeks without cardiac activity.  Pt reports no bleeding/cramping.  CX closed.  Pt and FOB very upset.  Options explained (waiting up to 2 weeks, cytotec, D&C) and given in discharge instructions.  They will go home, digest, and get back with me.  ABO/CBC drawn today.  CRESENZO-DISHMAN,Zineb Glade

## 2012-08-28 LAB — ABO AND RH: Rh Type: POSITIVE

## 2012-09-02 ENCOUNTER — Encounter: Payer: Self-pay | Admitting: Obstetrics & Gynecology

## 2012-09-02 ENCOUNTER — Encounter (HOSPITAL_COMMUNITY)
Admission: RE | Admit: 2012-09-02 | Discharge: 2012-09-02 | Disposition: A | Discharge status: Home / Self Care | Payer: Managed Care, Other (non HMO) | Source: Ambulatory Visit | Attending: Obstetrics & Gynecology | Admitting: Obstetrics & Gynecology

## 2012-09-02 ENCOUNTER — Encounter (HOSPITAL_COMMUNITY): Payer: Self-pay

## 2012-09-02 ENCOUNTER — Ambulatory Visit (INDEPENDENT_AMBULATORY_CARE_PROVIDER_SITE_OTHER): Payer: Managed Care, Other (non HMO) | Admitting: Obstetrics & Gynecology

## 2012-09-02 VITALS — BP 130/70 | Wt 256.0 lb

## 2012-09-02 DIAGNOSIS — O021 Missed abortion: Secondary | ICD-10-CM

## 2012-09-02 HISTORY — DX: Bipolar disorder, unspecified: F31.9

## 2012-09-02 LAB — COMPREHENSIVE METABOLIC PANEL
ALT: 12 U/L (ref 0–35)
Alkaline Phosphatase: 55 U/L (ref 39–117)
BUN: 4 mg/dL — ABNORMAL LOW (ref 6–23)
CO2: 25 mEq/L (ref 19–32)
GFR calc Af Amer: >90 mL/min (ref >90)
GFR calc non Af Amer: >90 mL/min (ref >90)
Glucose, Bld: 104 mg/dL — ABNORMAL HIGH (ref 70–99)
Potassium: 4 mEq/L (ref 3.5–5.1)
Sodium: 138 mEq/L (ref 135–145)

## 2012-09-02 LAB — CBC
HCT: 37.5 % (ref 36.0–46.0)
Hemoglobin: 12.8 g/dL (ref 12.0–15.0)
MCH: 30.8 pg (ref 26.0–34.0)
MCHC: 34.1 g/dL (ref 30.0–36.0)
RBC: 4.16 MIL/uL (ref 3.87–5.11)

## 2012-09-02 LAB — URINALYSIS, ROUTINE W REFLEX MICROSCOPIC
Bilirubin Urine: NEGATIVE
Ketones, ur: NEGATIVE mg/dL
Nitrite: NEGATIVE
Protein, ur: NEGATIVE mg/dL
Urobilinogen, UA: 0.2 mg/dL (ref 0.0–1.0)

## 2012-09-02 NOTE — Patient Instructions (Addendum)
Sara Carpenter  09/02/2012   Your procedure is scheduled on:  09/03/2012  Report to [facility] at  945  AM.  Call this number if you have problems the morning of surgery: [phone]   Remember:   Do not eat food or drink liquids after midnight.   Take these medicines the morning of surgery with A SIP OF WATER: none   Do not wear jewelry, make-up or nail polish.  Do not wear lotions, powders, or perfumes.   Do not shave 48 hours prior to surgery. Men may shave face and neck.  Do not bring valuables to the hospital.  [facility] is not responsible for any belongings or valuables.  Contacts, dentures or bridgework may not be worn into surgery.  Leave suitcase in the car. After surgery it may be brought to your room.  For patients admitted to the hospital, checkout time is 11:00 AM the day of discharge.   Patients discharged the day of surgery will not be allowed to drive home.  Name and phone number of your driver: family  Special Instructions: Shower using CHG 2 nights before surgery and the night before surgery.  If you shower the day of surgery use CHG.  Use special wash - you have one bottle of CHG for all showers.  You should use approximately 1/3 of the bottle for each shower.   Please read over the following fact sheets that you were given: Pain Booklet, Coughing and Deep Breathing, MRSA Information, Surgical Site Infection Prevention, Anesthesia Post-op Instructions and Care and Recovery After Surgery Dilation and Curettage or Vacuum Curettage Dilation and curettage (D&C) and vacuum curettage are minor procedures. A D&C involves stretching (dilation) the cervix and scraping (curettage) the inside lining of the womb (uterus). During a D&C, tissue is gently scraped from the inside lining of the uterus. During a vacuum curettage, the lining and tissue in the uterus are removed with the use of gentle suction. Curettage may be performed for diagnostic or therapeutic purposes. As a  diagnostic procedure, curettage is performed for the purpose of examining tissues from the uterus. Tissue examination may help determine causes or treatment options for symptoms. A diagnostic curettage may be performed for the following symptoms:  Irregular bleeding in the uterus.  Bleeding with the development of clots.  Spotting between menstrual periods.  Prolonged menstrual periods.  Bleeding after menopause.  No menstrual period (amenorrhea).  A change in size and shape of the uterus. A therapeutic curettage is performed to remove tissue, blood, or a contraceptive device. Therapeutic curettage may be performed for the following conditions:   Removal of an IUD (intrauterine device).  Removal of retained placenta after giving birth. Retained placenta can cause bleeding severe enough to require transfusions or an infection.  Abortion.  Miscarriage.  Removal of polyps inside the uterus.  Removal of uncommon types of fibroids (noncancerous lumps). LET YOUR CAREGIVER KNOW ABOUT:   Allergies to food or medicine.  Medicines taken, including vitamins, herbs, eyedrops, over-the-counter medicines, and creams.  Use of steroids (by mouth or creams).  Previous problems with anesthetics or numbing medicines.  History of bleeding problems or blood clots.  Previous surgery.  Other health problems, including diabetes and kidney problems.  Possibility of pregnancy, if this applies. RISKS AND COMPLICATIONS   Excessive bleeding.  Infection of the uterus.  Damage to the cervix.  Development of scar tissue (adhesions) inside the uterus, later causing abnormal amounts of menstrual bleeding.  Complications  from the general anesthetic, if a general anesthetic is used.  Putting a hole (perforation) in the uterus. This is rare. BEFORE THE PROCEDURE   Eat and drink before the procedure only as directed by your caregiver.  Arrange for someone to take you home. PROCEDURE   This  procedure may be done in a hospital, outpatient clinic, or caregiver's office.  You may be given a general anesthetic or a local anesthetic in and around the cervix.  You will lie on your back with your legs in stirrups.  There are two ways in which your cervix can be softened and dilated. These include:  Taking a medicine.  Having thin rods (laminaria) inserted into your cervix.  A curved tool (curette) will scrape cells from the inside lining of the uterus and will then be removed. This procedure usually takes about 15 to 30 minutes. AFTER THE PROCEDURE   You will rest in the recovery area until you are stable and are ready to go home.  You will need to have someone take you home.  You may feel sick to your stomach (nauseous) or throw up (vomit) if you had general anesthesia.  You may have a sore throat if a tube was placed in your throat during general anesthesia.  You may have light cramping and bleeding for 2 days to 2 weeks after the procedure.  Your uterus needs to make a new lining after the procedure. This may make your next period late. Document Released: 01/01/2005 Document Revised: 03/26/2011 Document Reviewed: 07/30/2008 [facility] Patient Information 2014 [place], [place]. PATIENT INSTRUCTIONS POST-ANESTHESIA  IMMEDIATELY FOLLOWING SURGERY:  Do not drive or operate machinery for the first twenty four hours after surgery.  Do not make any important decisions for twenty four hours after surgery or while taking narcotic pain medications or sedatives.  If you develop intractable nausea and vomiting or a severe headache please notify your doctor immediately.  FOLLOW-UP:  Please make an appointment with your surgeon as instructed. You do not need to follow up with anesthesia unless specifically instructed to do so.  WOUND CARE INSTRUCTIONS (if applicable):  Keep a dry clean dressing on the anesthesia/puncture wound site if there is drainage.  Once the wound has quit draining  you may leave it open to air.  Generally you should leave the bandage intact for twenty four hours unless there is drainage.  If the epidural site drains for more than 36-48 hours please call the anesthesia department.  QUESTIONS?:  Please feel free to call your physician or the hospital operator if you have any questions, and they will be happy to assist you.

## 2012-09-02 NOTE — Progress Notes (Signed)
Patient ID: Sara Carpenter, female   DOB: April 18, 1990, 22 y.o.   MRN: [id number] The patient is in today for followup from a missed AB in the first trimester She's all Sara Freeze Cresenzo-Dishmonlast week and they discussed options of Cytotec versus D&C  she is 11 weeks and 2 days by last menstrual period and as a result we very reasonable proceed with a D&C  The patient and the father of the baby are present and decided to proceed with a D&C which is scheduled for tomorrow all paperwork was filled out

## 2012-09-03 ENCOUNTER — Encounter (HOSPITAL_COMMUNITY)
Admission: RE | Disposition: A | Discharge status: Home / Self Care | Payer: Self-pay | Source: Ambulatory Visit | Attending: Obstetrics & Gynecology

## 2012-09-03 ENCOUNTER — Ambulatory Visit (HOSPITAL_COMMUNITY)
Admission: RE | Admit: 2012-09-03 | Discharge: 2012-09-03 | Disposition: A | Discharge status: Home / Self Care | Payer: Managed Care, Other (non HMO) | Source: Ambulatory Visit | Attending: Obstetrics & Gynecology | Admitting: Obstetrics & Gynecology

## 2012-09-03 ENCOUNTER — Other Ambulatory Visit: Payer: Self-pay | Admitting: Obstetrics & Gynecology

## 2012-09-03 ENCOUNTER — Encounter (HOSPITAL_COMMUNITY): Payer: Self-pay | Admitting: Anesthesiology

## 2012-09-03 ENCOUNTER — Ambulatory Visit (HOSPITAL_COMMUNITY): Payer: Managed Care, Other (non HMO) | Admitting: Anesthesiology

## 2012-09-03 ENCOUNTER — Encounter (HOSPITAL_COMMUNITY): Payer: Self-pay | Admitting: *Deleted

## 2012-09-03 DIAGNOSIS — Z87891 Personal history of nicotine dependence: Secondary | ICD-10-CM | POA: Insufficient documentation

## 2012-09-03 DIAGNOSIS — F319 Bipolar disorder, unspecified: Secondary | ICD-10-CM | POA: Insufficient documentation

## 2012-09-03 DIAGNOSIS — O021 Missed abortion: Secondary | ICD-10-CM

## 2012-09-03 DIAGNOSIS — E282 Polycystic ovarian syndrome: Secondary | ICD-10-CM | POA: Insufficient documentation

## 2012-09-03 HISTORY — PX: DILATION AND CURETTAGE OF UTERUS: SHX78

## 2012-09-03 LAB — URINE CULTURE: Culture: NO GROWTH

## 2012-09-03 SURGERY — DILATION AND CURETTAGE
Anesthesia: General | Site: Vagina | Wound class: Clean Contaminated

## 2012-09-03 MED ORDER — MIDAZOLAM HCL 5 MG/5ML IJ SOLN
INTRAMUSCULAR | Status: DC | PRN
  Administered 2012-09-03: 2 mg via INTRAVENOUS

## 2012-09-03 MED ORDER — FENTANYL CITRATE 0.05 MG/ML IJ SOLN
25.0000 ug | INTRAMUSCULAR | Status: DC | PRN
  Administered 2012-09-03 (×2): 50 ug via INTRAVENOUS

## 2012-09-03 MED ORDER — KETOROLAC TROMETHAMINE 30 MG/ML IJ SOLN
INTRAMUSCULAR | Status: AC
  Filled 2012-09-03: qty 1

## 2012-09-03 MED ORDER — FENTANYL CITRATE 0.05 MG/ML IJ SOLN
INTRAMUSCULAR | Status: AC
  Filled 2012-09-03: qty 2

## 2012-09-03 MED ORDER — CEFAZOLIN SODIUM-DEXTROSE 2-3 GM-% IV SOLR
2.0000 g | INTRAVENOUS | Status: AC
  Administered 2012-09-03: 2 g via INTRAVENOUS

## 2012-09-03 MED ORDER — METHYLERGONOVINE MALEATE 0.2 MG PO TABS: 0.2000 mg | ORAL_TABLET | Freq: Four times a day (QID) | ORAL | Status: DC

## 2012-09-03 MED ORDER — ONDANSETRON HCL 4 MG/2ML IJ SOLN
INTRAMUSCULAR | Status: AC
  Filled 2012-09-03: qty 2

## 2012-09-03 MED ORDER — FENTANYL CITRATE 0.05 MG/ML IJ SOLN
50.0000 ug | Freq: Once | INTRAMUSCULAR | Status: AC
  Administered 2012-09-03: 50 ug via INTRAVENOUS

## 2012-09-03 MED ORDER — MIDAZOLAM HCL 2 MG/2ML IJ SOLN
INTRAMUSCULAR | Status: AC
  Filled 2012-09-03: qty 2

## 2012-09-03 MED ORDER — HYDROCODONE-ACETAMINOPHEN 5-325 MG PO TABS: 1.0000 | ORAL_TABLET | Freq: Four times a day (QID) | ORAL | Status: DC | PRN

## 2012-09-03 MED ORDER — 0.9 % SODIUM CHLORIDE (POUR BTL) OPTIME
TOPICAL | Status: DC | PRN
  Administered 2012-09-03: 1000 mL

## 2012-09-03 MED ORDER — METHYLERGONOVINE MALEATE 0.2 MG/ML IJ SOLN
INTRAMUSCULAR | Status: DC | PRN
  Administered 2012-09-03: 0.2 mg via INTRAMUSCULAR

## 2012-09-03 MED ORDER — KETOROLAC TROMETHAMINE 10 MG PO TABS: 10.0000 mg | ORAL_TABLET | Freq: Three times a day (TID) | ORAL | Status: DC | PRN

## 2012-09-03 MED ORDER — PROMETHAZINE HCL 25 MG/ML IJ SOLN
12.5000 mg | Freq: Once | INTRAMUSCULAR | Status: AC
  Administered 2012-09-03: 12.5 mg via INTRAVENOUS

## 2012-09-03 MED ORDER — ONDANSETRON HCL 4 MG/2ML IJ SOLN
4.0000 mg | Freq: Once | INTRAMUSCULAR | Status: AC | PRN
  Administered 2012-09-03: 4 mg via INTRAVENOUS

## 2012-09-03 MED ORDER — LIDOCAINE HCL 1 % IJ SOLN
INTRAMUSCULAR | Status: DC | PRN
  Administered 2012-09-03: 50 mg via INTRADERMAL

## 2012-09-03 MED ORDER — PROPOFOL 10 MG/ML IV BOLUS
INTRAVENOUS | Status: DC | PRN
  Administered 2012-09-03: 300 mg via INTRAVENOUS

## 2012-09-03 MED ORDER — SODIUM CHLORIDE 0.9 % IJ SOLN
INTRAMUSCULAR | Status: AC
  Filled 2012-09-03: qty 10

## 2012-09-03 MED ORDER — ONDANSETRON HCL 4 MG/2ML IJ SOLN
4.0000 mg | Freq: Once | INTRAMUSCULAR | Status: AC
  Administered 2012-09-03: 4 mg via INTRAVENOUS

## 2012-09-03 MED ORDER — PROPOFOL 10 MG/ML IV EMUL
INTRAVENOUS | Status: AC
  Filled 2012-09-03: qty 20

## 2012-09-03 MED ORDER — MIDAZOLAM HCL 2 MG/2ML IJ SOLN
1.0000 mg | INTRAMUSCULAR | Status: DC | PRN
  Administered 2012-09-03: 2 mg via INTRAVENOUS

## 2012-09-03 MED ORDER — METHYLERGONOVINE MALEATE 0.2 MG/ML IJ SOLN
INTRAMUSCULAR | Status: AC
  Filled 2012-09-03: qty 1

## 2012-09-03 MED ORDER — FENTANYL CITRATE 0.05 MG/ML IJ SOLN
25.0000 ug | INTRAMUSCULAR | Status: AC
  Administered 2012-09-03 (×2): 25 ug via INTRAVENOUS

## 2012-09-03 MED ORDER — FENTANYL CITRATE 0.05 MG/ML IJ SOLN
INTRAMUSCULAR | Status: DC | PRN
  Administered 2012-09-03 (×2): 50 ug via INTRAVENOUS

## 2012-09-03 MED ORDER — LIDOCAINE HCL (PF) 1 % IJ SOLN
INTRAMUSCULAR | Status: AC
  Filled 2012-09-03: qty 5

## 2012-09-03 MED ORDER — CEFAZOLIN SODIUM-DEXTROSE 2-3 GM-% IV SOLR
INTRAVENOUS | Status: AC
  Filled 2012-09-03: qty 50

## 2012-09-03 MED ORDER — PROMETHAZINE HCL 25 MG/ML IJ SOLN
INTRAMUSCULAR | Status: AC
  Filled 2012-09-03: qty 1

## 2012-09-03 MED ORDER — LACTATED RINGERS IV SOLN
INTRAVENOUS | Status: DC
  Administered 2012-09-03: 12:00:00 via INTRAVENOUS

## 2012-09-03 MED ORDER — KETOROLAC TROMETHAMINE 30 MG/ML IJ SOLN
30.0000 mg | Freq: Once | INTRAMUSCULAR | Status: AC
  Administered 2012-09-03: 30 mg via INTRAVENOUS

## 2012-09-03 SURGICAL SUPPLY — 28 items
BAG HAMPER (MISCELLANEOUS) ×2 IMPLANT
CLOTH BEACON ORANGE TIMEOUT ST (SAFETY) ×2 IMPLANT
COVER LIGHT HANDLE STERIS (MISCELLANEOUS) ×4 IMPLANT
CURETTE VACUUM 9MM CVD CLR (CANNULA) ×2 IMPLANT
FORMALIN 10 PREFIL 480ML (MISCELLANEOUS) ×2 IMPLANT
GAUZE SPONGE 4X4 16PLY XRAY LF (GAUZE/BANDAGES/DRESSINGS) ×2 IMPLANT
GLOVE BIOGEL PI IND STRL 7.0 (GLOVE) ×1 IMPLANT
GLOVE BIOGEL PI IND STRL 7.5 (GLOVE) ×1 IMPLANT
GLOVE BIOGEL PI IND STRL 8 (GLOVE) ×1 IMPLANT
GLOVE BIOGEL PI INDICATOR 7.0 (GLOVE) ×1
GLOVE BIOGEL PI INDICATOR 7.5 (GLOVE) ×1
GLOVE BIOGEL PI INDICATOR 8 (GLOVE) ×1
GLOVE ECLIPSE 7.0 STRL STRAW (GLOVE) ×2 IMPLANT
GLOVE ECLIPSE 8.0 STRL XLNG CF (GLOVE) ×2 IMPLANT
GLOVE EXAM NITRILE LRG STRL (GLOVE) ×2 IMPLANT
GLOVE SS BIOGEL STRL SZ 6.5 (GLOVE) ×1 IMPLANT
GLOVE SUPERSENSE BIOGEL SZ 6.5 (GLOVE) ×1
GOWN STRL REIN XL XLG (GOWN DISPOSABLE) ×6 IMPLANT
KIT BERKELEY 1ST TRIMESTER 3/8 (MISCELLANEOUS) ×2 IMPLANT
KIT ROOM TURNOVER AP CYSTO (KITS) ×2 IMPLANT
MANIFOLD NEPTUNE II (INSTRUMENTS) ×2 IMPLANT
MARKER SKIN DUAL TIP RULER LAB (MISCELLANEOUS) ×2 IMPLANT
NS IRRIG 1000ML POUR BTL (IV SOLUTION) ×2 IMPLANT
PACK BASIC III (CUSTOM PROCEDURE TRAY) ×1
PACK SRG BSC III STRL LF ECLPS (CUSTOM PROCEDURE TRAY) ×1 IMPLANT
PAD ARMBOARD 7.5X6 YLW CONV (MISCELLANEOUS) ×2 IMPLANT
SET BERKELEY SUCTION TUBING (SUCTIONS) ×2 IMPLANT
TOWEL OR 17X26 4PK STRL BLUE (TOWEL DISPOSABLE) ×2 IMPLANT

## 2012-09-03 NOTE — Anesthesia Postprocedure Evaluation (Signed)
  Anesthesia Post-op Note  Patient: Neurosurgeon  Procedure(s) Performed: Procedure(s): SUCTION DILATATION AND CURETTAGE (N/A)  Patient Location: PACU  Anesthesia Type:General  Level of Consciousness: awake, alert , oriented and patient cooperative  Airway and Oxygen Therapy: Patient Spontanous Breathing  Post-op Pain: 2 /10, mild  Post-op Assessment: Post-op Vital signs reviewed, Patient's Cardiovascular Status Stable, Respiratory Function Stable, Patent Airway, No signs of Nausea or vomiting and Pain level controlled  Post-op Vital Signs: Reviewed and stable  Complications: No apparent anesthesia complications

## 2012-09-03 NOTE — Transfer of Care (Signed)
Immediate Anesthesia Transfer of Care Note  Patient: Sara Carpenter  Procedure(s) Performed: Procedure(s): SUCTION DILATATION AND CURETTAGE (N/A)  Patient Location: PACU  Anesthesia Type:General  Level of Consciousness: awake and patient cooperative  Airway & Oxygen Therapy: Patient Spontanous Breathing and Patient connected to face mask oxygen  Post-op Assessment: Report given to PACU RN, Post -op Vital signs reviewed and stable and Patient moving all extremities  Post vital signs: Reviewed and stable  Complications: No apparent anesthesia complications

## 2012-09-03 NOTE — H&P (Signed)
Preoperative History and Physical  Sara Carpenter is a 22 y.o. G1P0 with Patient's last menstrual period was 06/09/2012. admitted for a cervical dilation and uterine curettage, suction and sharp, for management of a 1st trimester pregnancy loss, missed Ab .  She was seen in office has a CRL of 8 weeks and 6 days with no fetal cardiac activity.  She was given the options and she has chosen an operative care plan.  PMH:    Past Medical History  Diagnosis Date  . Obesity   . PCOS (polycystic ovarian syndrome)     "When I was young". No medical intervention.  . Infected tooth   . Bipolar 1 disorder     PSH:     Past Surgical History  Procedure Laterality Date  . Tonsillectomy      POb/GynH:      OB History   Grav Para Term Preterm Abortions TAB SAB Ect Mult Living   1         0      SH:   History  Substance Use Topics  . Smoking status: Former Smoker -- 1.00 packs/day for 2 years    Types: Cigarettes    Quit date: 08/25/2012  . Smokeless tobacco: Never Used  . Alcohol Use: Yes     Comment: Rare    FH:    Family History  Problem Relation Age of Onset  . Diabetes Paternal Grandmother   . Diabetes Paternal Grandfather      Allergies: No Known Allergies  Medications:      Current facility-administered medications:ceFAZolin (ANCEF) IVPB 2 g/50 mL premix, 2 g, Intravenous, On Call to OR, Lazaro Arms, MD;  lactated ringers infusion, , Intravenous, Continuous, Laurene Footman, MD, Last Rate: 75 mL/hr at 09/03/12 1204;  midazolam (VERSED) injection 1-2 mg, 1-2 mg, Intravenous, Q5 Min x 3 PRN, Laurene Footman, MD, 2 mg at 09/03/12 1208  Review of Systems:   Review of Systems  Constitutional: Negative for fever, chills, weight loss, malaise/fatigue and diaphoresis.  HENT: Negative for hearing loss, ear pain, nosebleeds, congestion, sore throat, neck pain, tinnitus and ear discharge.   Eyes: Negative for blurred vision, double vision, photophobia, pain, discharge and  redness.  Respiratory: Negative for cough, hemoptysis, sputum production, shortness of breath, wheezing and stridor.   Cardiovascular: Negative for chest pain, palpitations, orthopnea, claudication, leg swelling and PND.  Gastrointestinal: Negative for abdominal pain. Negative for heartburn, nausea, vomiting, diarrhea, constipation, blood in stool and melena.  Genitourinary: Negative for dysuria, urgency, frequency, hematuria and flank pain.  Musculoskeletal: Negative for myalgias, back pain, joint pain and falls.  Skin: Negative for itching and rash.  Neurological: Negative for dizziness, tingling, tremors, sensory change, speech change, focal weakness, seizures, loss of consciousness, weakness and headaches.  Endo/Heme/Allergies: Negative for environmental allergies and polydipsia. Does not bruise/bleed easily.  Psychiatric/Behavioral: Negative for depression, suicidal ideas, hallucinations, memory loss and substance abuse. The patient is not nervous/anxious and does not have insomnia.      PHYSICAL EXAM:  Blood pressure 116/75, pulse 68, temperature 97.6 F (36.4 C), temperature source Oral, resp. rate 25, height 5\' 5"  (1.651 m), weight 256 lb (116.121 kg), last menstrual period 06/09/2012, SpO2 99.00%.    Vitals reviewed. Constitutional: She is oriented to person, place, and time. She appears well-developed and well-nourished.  HENT:  Head: Normocephalic and atraumatic.  Right Ear: External ear normal.  Left Ear: External ear normal.  Nose: Nose normal.  Mouth/Throat: Oropharynx is clear and moist.  Eyes: Conjunctivae and EOM are normal. Pupils are equal, round, and reactive to light. Right eye exhibits no discharge. Left eye exhibits no discharge. No scleral icterus.  Neck: Normal range of motion. Neck supple. No tracheal deviation present. No thyromegaly present.  Cardiovascular: Normal rate, regular rhythm, normal heart sounds and intact distal pulses.  Exam reveals no gallop and  no friction rub.   No murmur heard. Respiratory: Effort normal and breath sounds normal. No respiratory distress. She has no wheezes. She has no rales. She exhibits no tenderness.  GI: Soft. Bowel sounds are normal. She exhibits no distension and no mass. There is tenderness. There is no rebound and no guarding.  Genitourinary:       Vulva is normal without lesions Vagina is pink moist without discharge Cervix normal in appearance and pap is normal Uterus is enlarged to 8 weeks size Adnexa is negative with normal sized ovaries by sonogram  Musculoskeletal: Normal range of motion. She exhibits no edema and no tenderness.  Neurological: She is alert and oriented to person, place, and time. She has normal reflexes. She displays normal reflexes. No cranial nerve deficit. She exhibits normal muscle tone. Coordination normal.  Skin: Skin is warm and dry. No rash noted. No erythema. No pallor.  Psychiatric: She has a normal mood and affect. Her behavior is normal. Judgment and thought content normal.    Labs: Results for orders placed during the hospital encounter of 09/02/12 (from the past 336 hour(s))  CBC   Collection Time    09/02/12  2:50 PM      Result Value Range   WBC 10.9 (*) 4.0 - 10.5 K/uL   RBC 4.16  3.87 - 5.11 MIL/uL   Hemoglobin 12.8  12.0 - 15.0 g/dL   HCT 16.1  09.6 - 04.5 %   MCV 90.1  78.0 - 100.0 fL   MCH 30.8  26.0 - 34.0 pg   MCHC 34.1  30.0 - 36.0 g/dL   RDW 40.9  81.1 - 91.4 %   Platelets 355  150 - 400 K/uL  COMPREHENSIVE METABOLIC PANEL   Collection Time    09/02/12  2:50 PM      Result Value Range   Sodium 138  135 - 145 mEq/L   Potassium 4.0  3.5 - 5.1 mEq/L   Chloride 106  96 - 112 mEq/L   CO2 25  19 - 32 mEq/L   Glucose, Bld 104 (*) 70 - 99 mg/dL   BUN 4 (*) 6 - 23 mg/dL   Creatinine, Ser 7.82  0.50 - 1.10 mg/dL   Calcium 9.8  8.4 - 95.6 mg/dL   Total Protein 6.0  6.0 - 8.3 g/dL   Albumin 3.3 (*) 3.5 - 5.2 g/dL   AST 12  0 - 37 U/L   ALT 12  0 - 35  U/L   Alkaline Phosphatase 55  39 - 117 U/L   Total Bilirubin 0.3  0.3 - 1.2 mg/dL   GFR calc non Af Amer >90  >90 mL/min   GFR calc Af Amer >90  >90 mL/min  URINALYSIS, ROUTINE W REFLEX MICROSCOPIC   Collection Time    09/02/12  2:55 PM      Result Value Range   Color, Urine YELLOW  YELLOW   APPearance CLEAR  CLEAR   Specific Gravity, Urine >1.030 (*) 1.005 - 1.030   pH 6.0  5.0 - 8.0   Glucose, UA NEGATIVE  NEGATIVE mg/dL   Hgb urine  dipstick NEGATIVE  NEGATIVE   Bilirubin Urine NEGATIVE  NEGATIVE   Ketones, ur NEGATIVE  NEGATIVE mg/dL   Protein, ur NEGATIVE  NEGATIVE mg/dL   Urobilinogen, UA 0.2  0.0 - 1.0 mg/dL   Nitrite NEGATIVE  NEGATIVE   Leukocytes, UA SMALL (*) NEGATIVE  URINE MICROSCOPIC-ADD ON   Collection Time    09/02/12  2:55 PM      Result Value Range   Squamous Epithelial / LPF MANY (*) RARE   WBC, UA 3-6  <3 WBC/hpf   Bacteria, UA MANY (*) RARE  Results for orders placed in visit on 08/27/12 (from the past 336 hour(s))  CBC   Collection Time    08/27/12 10:02 AM      Result Value Range   WBC 11.1 (*) 4.0 - 10.5 K/uL   RBC 4.51  3.87 - 5.11 MIL/uL   Hemoglobin 13.6  12.0 - 15.0 g/dL   HCT 45.4  09.8 - 11.9 %   MCV 89.6  78.0 - 100.0 fL   MCH 30.2  26.0 - 34.0 pg   MCHC 33.7  30.0 - 36.0 g/dL   RDW 14.7  82.9 - 56.2 %   Platelets 409 (*) 150 - 400 K/uL  ABO AND RH    Collection Time    08/27/12 10:02 AM      Result Value Range   ABO Grouping O     Rh Type POS      EKG: No orders found for this or any previous visit.  Imaging Studies: US Ob Transvaginal  08/27/2012   DATING AND VIABILITY SONOGRAM   Kaeden R Digiulio is a 22 y.o. year old G1P0 with EDD 03/22/2013 which  would correlate to  [redacted]w[redacted]d weeks gestation.  She has irregular menstrual  cycles.  She is here today for a confirmatory sonogram.    GESTATION: SINGLETON     FETAL ACTIVITY:          Heart rate         ABSENT          The fetus is inactive.  CERVIX: Measures 3.0 cm  ADNEXA: The ovaries  are normal.   GESTATIONAL AGE AND  BIOMETRICS:  Gestational criteria: Estimated Date of Delivery: 03/22/13 by early  ultrasound now at [redacted]w[redacted]d  Previous Scans:1 [facility])  GESTATIONAL SAC            mm          weeks  CROWN RUMP LENGTH           22.1 mm         8+6 weeks                                                                               AVERAGE EGA(BY THIS SCAN):   8+6 weeks       TECHNICIAN COMMENTS:  Non-Viable IUP, CRL c/w 8+6wks, cx long and closed, bilateral adnexa wnl,  no free fluid noted, Drenda Freeze Dishmon-Cresenzo,CNM came into exam room to  confirm findings.       A copy of this report including all images has been saved and backed up to  a second source for retrieval if  needed. All measures and details of the  anatomical scan, placentation, fluid volume and pelvic anatomy are  contained in that report.  Chari Manning 08/27/2012 9:49 AM       Assessment: Patient Active Problem List   Diagnosis Date Noted  . Missed abortion 08/27/2012    Plan: Dilation and curettage for missed AB in first trimester  Arnav Cregg H 09/03/2012 1:04 PM

## 2012-09-03 NOTE — Progress Notes (Signed)
Dr Despina Hidden notified that pt's father wants rx's called in to [facility]. Okay to call toradol and methergine in to pharmacy.

## 2012-09-03 NOTE — Op Note (Signed)
Preoperative diagnosis: Missed abortion in the first trimester  Postoperative diagnosis:  Same as above  Procedure:  Cervical dilation with suction and sharp uterine curettage  Surgeon:  Lazaro Arms  Anesthesia:  Laryngeal mask airway  Findings: Sonogram report:  US Ob Transvaginal  08/27/2012   DATING AND VIABILITY SONOGRAM   Sara Carpenter is a 22 y.o. year old G1P0 with EDD 03/22/2013 which  would correlate to  [redacted]w[redacted]d weeks gestation.  She has irregular menstrual  cycles.  She is here today for a confirmatory sonogram.    GESTATION: SINGLETON     FETAL ACTIVITY:          Heart rate         ABSENT          The fetus is inactive.  CERVIX: Measures 3.0 cm  ADNEXA: The ovaries are normal.   GESTATIONAL AGE AND  BIOMETRICS:  Gestational criteria: Estimated Date of Delivery: 03/22/13 by early  ultrasound now at [redacted]w[redacted]d  Previous Scans:1 [facility])  GESTATIONAL SAC            mm          weeks  CROWN RUMP LENGTH           22.1 mm         8+6 weeks                                                                               AVERAGE EGA(BY THIS SCAN):   8+6 weeks       TECHNICIAN COMMENTS:  Non-Viable IUP, CRL c/w 8+6wks, cx long and closed, bilateral adnexa wnl,  no free fluid noted, Drenda Freeze Dishmon-Cresenzo,CNM came into exam room to  confirm findings.       A copy of this report including all images has been saved and backed up to  a second source for retrieval if needed. All measures and details of the  anatomical scan, placentation, fluid volume and pelvic anatomy are  contained in that report.  Chari Manning 08/27/2012 9:49 AM    Description of operation:  The patient was taken to the operating room and placed in the supine position.  She underwent laryngeal mask airway general anesthesia.  The patient was placed in the dorsal lithotomy position.  The vagina was prepped and draped in the usual sterile fashion.  A Graves speculum was placed.  The anterior cervix was grasped with a single-tooth  tenaculum.  The cervix was dilated serially with Hegar dilators.  A #9 curved suction curette was placed in the uterus.  The suction pressure was placed at 55 and several passes were made.  All of the intrauterine contents were removed.  The sharp curette was used x1 to feel uterine crie in all areas.  The patient was given Methergine 0.2 mg IM x1.  There was good hemostasis.  The patient was given Ancef 2 grams and Toradol 30 mg IV preoperatively.     Estimated blood loss for the procedure was 150 cc.  The patient was awakened from anesthesia taken to the recovery room in good stable condition.  All counts were correct x3.  Dionel Archey H 09/03/2012 1:53 PM

## 2012-09-03 NOTE — Progress Notes (Signed)
Informed father rx x2 called in to [facility] in [place]. Father states that pt does not live in [place], and he would like the rx's called in to [facility] in [place]. Called [facility] in [place]. They do not have methergine available and can not order it. Father notified. He would like the rx's called in to [facility] on [address].  These rx's called to [facility] on [address]: toradol 10 mg tabs. Take 1 tab every 6 hrs as needed for pain. Dispense 15 tabs. Methergine 0.2 mg tab. Take 1 tab every 6 hrs til gone. Dispense 6 tabs.  Father notified. Voiced understanding.

## 2012-09-03 NOTE — Anesthesia Procedure Notes (Signed)
Procedure Name: LMA Insertion Date/Time: 09/03/2012 1:29 PM Performed by: Despina Hidden Pre-anesthesia Checklist: Emergency Drugs available, Patient identified, Patient being monitored and Suction available Patient Re-evaluated:Patient Re-evaluated prior to inductionOxygen Delivery Method: Circle system utilized Preoxygenation: Pre-oxygenation with 100% oxygen Intubation Type: IV induction Ventilation: Mask ventilation without difficulty and Oral airway inserted - appropriate to patient size LMA: LMA inserted LMA Size: 3.0 Tube type: Oral Number of attempts: 2 Placement Confirmation: breath sounds checked- equal and bilateral and positive ETCO2 Tube secured with: Tape Dental Injury: Teeth and Oropharynx as per pre-operative assessment

## 2012-09-03 NOTE — Progress Notes (Signed)
Fentanyl 50 mcg and phenergan 12.5 mg iv given for C/O abd cramping and nausea.

## 2012-09-03 NOTE — Progress Notes (Signed)
Awake. C/O nausea. No emesis. Saltines and ginger-ale given. Tolerated well.

## 2012-09-03 NOTE — Anesthesia Preprocedure Evaluation (Signed)
Anesthesia Evaluation  Patient identified by MRN, date of birth, ID band Patient awake    Reviewed: Allergy & Precautions, H&P , NPO status , Patient's Chart, lab work & pertinent test results  Airway Mallampati: I TM Distance: >3 FB     Dental   Pulmonary former smoker,  breath sounds clear to auscultation        Cardiovascular negative cardio ROS  Rhythm:Regular Rate:Normal     Neuro/Psych PSYCHIATRIC DISORDERS Bipolar Disorder    GI/Hepatic (+)     substance abuse  marijuana use,   Endo/Other    Renal/GU      Musculoskeletal   Abdominal   Peds  Hematology  (+) Blood dyscrasia (hx "free bleeder"), ,   Anesthesia Other Findings   Reproductive/Obstetrics                           Anesthesia Physical Anesthesia Plan  ASA: II  Anesthesia Plan: General   Post-op Pain Management:    Induction: Intravenous  Airway Management Planned: LMA  Additional Equipment:   Intra-op Plan:   Post-operative Plan: Extubation in OR  Informed Consent: I have reviewed the patients History and Physical, chart, labs and discussed the procedure including the risks, benefits and alternatives for the proposed anesthesia with the patient or authorized representative who has indicated his/her understanding and acceptance.     Plan Discussed with:   Anesthesia Plan Comments:         Anesthesia Quick Evaluation

## 2012-09-03 NOTE — Progress Notes (Signed)
Awake. Crying. C/O nausea and abdominal cramping.. Dr Despina Hidden notified. Orders given.

## 2012-09-05 ENCOUNTER — Encounter (HOSPITAL_COMMUNITY): Payer: Self-pay | Admitting: Obstetrics & Gynecology

## 2012-10-06 ENCOUNTER — Ambulatory Visit (INDEPENDENT_AMBULATORY_CARE_PROVIDER_SITE_OTHER): Payer: Managed Care, Other (non HMO) | Admitting: Obstetrics & Gynecology

## 2012-10-06 ENCOUNTER — Encounter: Payer: Self-pay | Admitting: Obstetrics & Gynecology

## 2012-10-06 VITALS — BP 128/76 | Ht 65.0 in | Wt 267.0 lb

## 2012-10-06 DIAGNOSIS — Z9889 Other specified postprocedural states: Secondary | ICD-10-CM

## 2012-10-06 NOTE — Progress Notes (Signed)
Patient ID: Sara Carpenter, female   DOB: 1990-12-01, 22 y.o.   MRN: [id number] The patient is in today as a post operative visit  She had a spontaneous pregnancy loss in August And had a D&C Since that time her bleeding has stopped and she's had no further problems  She is reminded to avoid pregnancy until least December  Exam Normal external genitalia cervix is unremarkable Uterus normally involuted adnexa is negative  Impression Normal postoperative course  Plan Avoid pregnancy until after December All up as needed

## 2012-11-20 ENCOUNTER — Other Ambulatory Visit: Payer: Self-pay

## 2012-12-10 ENCOUNTER — Ambulatory Visit (INDEPENDENT_AMBULATORY_CARE_PROVIDER_SITE_OTHER): Payer: Managed Care, Other (non HMO) | Admitting: Advanced Practice Midwife

## 2012-12-10 ENCOUNTER — Encounter: Payer: Self-pay | Admitting: Advanced Practice Midwife

## 2012-12-10 VITALS — BP 120/72 | Ht 66.0 in | Wt 282.0 lb

## 2012-12-10 DIAGNOSIS — N61 Mastitis without abscess: Secondary | ICD-10-CM

## 2012-12-10 DIAGNOSIS — L723 Sebaceous cyst: Secondary | ICD-10-CM

## 2012-12-10 DIAGNOSIS — N611 Abscess of the breast and nipple: Secondary | ICD-10-CM | POA: Insufficient documentation

## 2012-12-10 MED ORDER — SULFAMETHOXAZOLE-TMP DS 800-160 MG PO TABS: 1.0000 | ORAL_TABLET | Freq: Two times a day (BID) | ORAL | Status: DC

## 2012-12-10 NOTE — Progress Notes (Signed)
Sara Carpenter 22 y.o. Filed Vitals:   12/10/12 0956  BP: 120/72   C/O Lt breast sore that started when she was pregnant (has SAB in August.  She popped it, it drained, but now has been back for over a month.  Very tender.  Past Medical History  Diagnosis Date  . Obesity   . PCOS (polycystic ovarian syndrome)     "When I was young". No medical intervention.  . Infected tooth   . Bipolar 1 disorder    Past Surgical History  Procedure Laterality Date  . Tonsillectomy    . Dilation and curettage of uterus N/A 09/03/2012    Procedure: SUCTION DILATATION AND CURETTAGE;  Surgeon: Lazaro Arms, MD;  Location: [facility];  Service: Gynecology;  Laterality: N/A;     C/O style in Right eye for 1 month  PE: 1 cm abscess adjacent to R nipple.  Soft, red, and starting to peel.  Dr. Despina Hidden suggested Bactrim DS BID X 14 days; if not resolved, refer to surgeon for removal.  Tiny sebaceous cyst ~ 1 cm below Right eye.  It doesn;t bother pt, but if she wants it removed, may try either opthamologist or dermatologist.

## 2012-12-24 ENCOUNTER — Encounter: Payer: Self-pay | Admitting: Adult Health

## 2012-12-24 ENCOUNTER — Ambulatory Visit (INDEPENDENT_AMBULATORY_CARE_PROVIDER_SITE_OTHER): Payer: Managed Care, Other (non HMO) | Admitting: Adult Health

## 2012-12-24 VITALS — BP 128/88 | Ht 66.0 in | Wt 277.0 lb

## 2012-12-24 DIAGNOSIS — Z3202 Encounter for pregnancy test, result negative: Secondary | ICD-10-CM

## 2012-12-24 DIAGNOSIS — Z319 Encounter for procreative management, unspecified: Secondary | ICD-10-CM

## 2012-12-24 DIAGNOSIS — Z872 Personal history of diseases of the skin and subcutaneous tissue: Secondary | ICD-10-CM

## 2012-12-24 NOTE — Patient Instructions (Signed)
Have sex every other day Call with next period  Review handouts on miscarriage and fertility

## 2012-12-24 NOTE — Progress Notes (Signed)
Subjective:     Patient ID: Sara Carpenter, female   DOB: [birth year], 20 y.o.   MRN: [id number]  HPI Tamico is a 22 year old white female for follow up on left breast abscess.She is teary wants to be pregnant.  Review of Systems See HPI Reviewed past medical,surgical, social and family history. Reviewed medications and allergies.     Objective:   Physical Exam BP 128/88  Ht 5\' 6"  (1.676 m)  Wt 277 lb (125.646 kg)  BMI 44.73 kg/m2  LMP 11/08/2014UPT negative.  Skin warm and dry,  Breasts:no dominate palpable mass, retraction or nipple discharge, the abscess left breast has resolved, the left nipple slightly inverted and has always been, period late and she wants to be pregnant, had miscarriage in August.     Assessment:     Left breast abscess resolved Desires pregnancy     Plan:    Review handouts by Gaylyn Rong on fertility and understanding miscarriage Call with next period, discussed timing of sex and take prenatal vitamins

## 2013-04-08 ENCOUNTER — Inpatient Hospital Stay (HOSPITAL_COMMUNITY)
Admission: AD | Admit: 2013-04-08 | Discharge: 2013-04-08 | Disposition: A | Discharge status: Home / Self Care | Payer: Managed Care, Other (non HMO) | Source: Ambulatory Visit | Attending: Obstetrics & Gynecology | Admitting: Obstetrics & Gynecology

## 2013-04-08 ENCOUNTER — Inpatient Hospital Stay (HOSPITAL_COMMUNITY): Payer: Managed Care, Other (non HMO)

## 2013-04-08 ENCOUNTER — Encounter (HOSPITAL_COMMUNITY): Payer: Self-pay | Admitting: *Deleted

## 2013-04-08 DIAGNOSIS — O209 Hemorrhage in early pregnancy, unspecified: Secondary | ICD-10-CM

## 2013-04-08 DIAGNOSIS — Z87891 Personal history of nicotine dependence: Secondary | ICD-10-CM | POA: Insufficient documentation

## 2013-04-08 LAB — URINALYSIS, ROUTINE W REFLEX MICROSCOPIC
Bilirubin Urine: NEGATIVE
Glucose, UA: NEGATIVE mg/dL
Ketones, ur: NEGATIVE mg/dL
Leukocytes, UA: NEGATIVE
NITRITE: NEGATIVE
Protein, ur: 30 mg/dL — AB
Specific Gravity, Urine: 1.025 (ref 1.005–1.030)
Urobilinogen, UA: 0.2 mg/dL (ref 0.0–1.0)
pH: 6 (ref 5.0–8.0)

## 2013-04-08 LAB — WET PREP, GENITAL
CLUE CELLS WET PREP: NONE SEEN
Trich, Wet Prep: NONE SEEN
YEAST WET PREP: NONE SEEN

## 2013-04-08 LAB — CBC
HCT: 41.1 % (ref 36.0–46.0)
Hemoglobin: 13.9 g/dL (ref 12.0–15.0)
MCH: 30.2 pg (ref 26.0–34.0)
MCHC: 33.8 g/dL (ref 30.0–36.0)
MCV: 89.3 fL (ref 78.0–100.0)
Platelets: 401 10*3/uL — ABNORMAL HIGH (ref 150–400)
RBC: 4.6 MIL/uL (ref 3.87–5.11)
RDW: 13.5 % (ref 11.5–15.5)
WBC: 13.6 10*3/uL — AB (ref 4.0–10.5)

## 2013-04-08 LAB — URINE MICROSCOPIC-ADD ON

## 2013-04-08 LAB — POCT PREGNANCY, URINE: Preg Test, Ur: POSITIVE — AB

## 2013-04-08 LAB — HCG, QUANTITATIVE, PREGNANCY: HCG, BETA CHAIN, QUANT, S: 1873 m[IU]/mL — AB (ref <5)

## 2013-04-08 NOTE — Discharge Instructions (Signed)
Pregnancy - First Trimester  During sexual intercourse, millions of sperm go into the vagina. Only 1 sperm will penetrate and fertilize the female egg while it is in the Fallopian tube. One week later, the fertilized egg implants into the wall of the uterus. An embryo begins to develop into a baby. At 6 to 8 weeks, the eyes and face are formed and the heartbeat can be seen on ultrasound. At the end of 12 weeks (first trimester), all the baby's organs are formed. Now that you are pregnant, you will want to do everything you can to have a healthy baby. Two of the most important things are to get good prenatal care and follow your caregiver's instructions. Prenatal care is all the medical care you receive before the baby's birth. It is given to prevent, find, and treat problems during the pregnancy and childbirth.  PRENATAL EXAMS  · During prenatal visits, your weight, blood pressure, and urine are checked. This is done to make sure you are healthy and progressing normally during the pregnancy.  · A pregnant woman should gain 25 to 35 pounds during the pregnancy. However, if you are overweight or underweight, your caregiver will advise you regarding your weight.  · Your caregiver will ask and answer questions for you.  · Blood work, cervical cultures, other necessary tests, and a Pap test are done during your prenatal exams. These tests are done to check on your health and the probable health of your baby. Tests are strongly recommended and done for HIV with your permission. This is the virus that causes AIDS. These tests are done because medicines can be given to help prevent your baby from being born with this infection should you have been infected without knowing it. Blood work is also used to find out your blood type, previous infections, and follow your blood levels (hemoglobin).  · Low hemoglobin (anemia) is common during pregnancy. Iron and vitamins are given to help prevent this. Later in the pregnancy, blood  tests for diabetes will be done along with any other tests if any problems develop.  · You may need other tests to make sure you and the baby are doing well.  CHANGES DURING THE FIRST TRIMESTER   Your body goes through many changes during pregnancy. They vary from person to person. Talk to your caregiver about changes you notice and are concerned about. Changes can include:  · Your menstrual period stops.  · The egg and sperm carry the genes that determine what you look like. Genes from you and your partner are forming a baby. The female genes determine whether the baby is a boy or a girl.  · Your body increases in girth and you may feel bloated.  · Feeling sick to your stomach (nauseous) and throwing up (vomiting). If the vomiting is uncontrollable, call your caregiver.  · Your breasts will begin to enlarge and become tender.  · Your nipples may stick out more and become darker.  · The need to urinate more. Painful urination may mean you have a bladder infection.  · Tiring easily.  · Loss of appetite.  · Cravings for certain kinds of food.  · At first, you may gain or lose a couple of pounds.  · You may have changes in your emotions from day to day (excited to be pregnant or concerned something may go wrong with the pregnancy and baby).  · You may have more vivid and strange dreams.  HOME CARE INSTRUCTIONS   ·   It is very important to avoid all smoking, alcohol and non-prescribed drugs during your pregnancy. These affect the formation and growth of the baby. Avoid chemicals while pregnant to ensure the delivery of a healthy infant.  · Start your prenatal visits by the 12th week of pregnancy. They are usually scheduled monthly at first, then more often in the last 2 months before delivery. Keep your caregiver's appointments. Follow your caregiver's instructions regarding medicine use, blood and lab tests, exercise, and diet.  · During pregnancy, you are providing food for you and your baby. Eat regular, well-balanced  meals. Choose foods such as meat, fish, milk and other low fat dairy products, vegetables, fruits, and whole-grain breads and cereals. Your caregiver will tell you of the ideal weight gain.  · You can help morning sickness by keeping soda crackers at the bedside. Eat a couple before arising in the morning. You may want to use the crackers without salt on them.  · Eating 4 to 5 small meals rather than 3 large meals a day also may help the nausea and vomiting.  · Drinking liquids between meals instead of during meals also seems to help nausea and vomiting.  · A physical sexual relationship may be continued throughout pregnancy if there are no other problems. Problems may be early (premature) leaking of amniotic fluid from the membranes, vaginal bleeding, or belly (abdominal) pain.  · Exercise regularly if there are no restrictions. Check with your caregiver or physical therapist if you are unsure of the safety of some of your exercises. Greater weight gain will occur in the last 2 trimesters of pregnancy. Exercising will help:  · Control your weight.  · Keep you in shape.  · Prepare you for labor and delivery.  · Help you lose your pregnancy weight after you deliver your baby.  · Wear a good support or jogging bra for breast tenderness during pregnancy. This may help if worn during sleep too.  · Ask when prenatal classes are available. Begin classes when they are offered.  · Do not use hot tubs, steam rooms, or saunas.  · Wear your seat belt when driving. This protects you and your baby if you are in an accident.  · Avoid raw meat, uncooked cheese, cat litter boxes, and soil used by cats throughout the pregnancy. These carry germs that can cause birth defects in the baby.  · The first trimester is a good time to visit your dentist for your dental health. Getting your teeth cleaned is okay. Use a softer toothbrush and brush gently during pregnancy.  · Ask for help if you have financial, counseling, or nutritional needs  during pregnancy. Your caregiver will be able to offer counseling for these needs as well as refer you for other special needs.  · Do not take any medicines or herbs unless told by your caregiver.  · Inform your caregiver if there is any mental or physical domestic violence.  · Make a list of emergency phone numbers of family, friends, hospital, and police and fire departments.  · Write down your questions. Take them to your prenatal visit.  · Do not douche.  · Do not cross your legs.  · If you have to stand for long periods of time, rotate you feet or take small steps in a circle.  · You may have more vaginal secretions that may require a sanitary pad. Do not use tampons or scented sanitary pads.  MEDICINES AND DRUG USE IN PREGNANCY  ·   Take prenatal vitamins as directed. The vitamin should contain 1 milligram of folic acid. Keep all vitamins out of reach of children. Only a couple vitamins or tablets containing iron may be fatal to a baby or young child when ingested.  · Avoid use of all medicines, including herbs, over-the-counter medicines, not prescribed or suggested by your caregiver. Only take over-the-counter or prescription medicines for pain, discomfort, or fever as directed by your caregiver. Do not use aspirin, ibuprofen, or naproxen unless directed by your caregiver.  · Let your caregiver also know about herbs you may be using.  · Alcohol is related to a number of birth defects. This includes fetal alcohol syndrome. All alcohol, in any form, should be avoided completely. Smoking will cause low birth rate and premature babies.  · Street or illegal drugs are very harmful to the baby. They are absolutely forbidden. A baby born to an addicted mother will be addicted at birth. The baby will go through the same withdrawal an adult does.  · Let your caregiver know about any medicines that you have to take and for what reason you take them.  SEEK MEDICAL CARE IF:   You have any concerns or worries during your  pregnancy. It is better to call with your questions if you feel they cannot wait, rather than worry about them.  SEEK IMMEDIATE MEDICAL CARE IF:   · An unexplained oral temperature above 102° F (38.9° C) develops, or as your caregiver suggests.  · You have leaking of fluid from the vagina (birth canal). If leaking membranes are suspected, take your temperature and inform your caregiver of this when you call.  · There is vaginal spotting or bleeding. Notify your caregiver of the amount and how many pads are used.  · You develop a bad smelling vaginal discharge with a change in the color.  · You continue to feel sick to your stomach (nauseated) and have no relief from remedies suggested. You vomit blood or coffee ground-like materials.  · You lose more than 2 pounds of weight in 1 week.  · You gain more than 2 pounds of weight in 1 week and you notice swelling of your face, hands, feet, or legs.  · You gain 5 pounds or more in 1 week (even if you do not have swelling of your hands, face, legs, or feet).  · You get exposed to German measles and have never had them.  · You are exposed to fifth disease or chickenpox.  · You develop belly (abdominal) pain. Round ligament discomfort is a common non-cancerous (benign) cause of abdominal pain in pregnancy. Your caregiver still must evaluate this.  · You develop headache, fever, diarrhea, pain with urination, or shortness of breath.  · You fall or are in a car accident or have any kind of trauma.  · There is mental or physical violence in your home.  Document Released: 12/26/2000 Document Revised: 09/26/2011 Document Reviewed: 06/29/2008  ExitCare® Patient Information ©2014 [employer].

## 2013-04-08 NOTE — MAU Provider Note (Signed)
History     CSN: [id number]  Arrival date and time: 04/08/13 1614   First Provider Initiated Contact with Patient 04/08/13 1737      Chief Complaint  Patient presents with  . Vaginal Bleeding   Vaginal Bleeding    Sara Carpenter is a 23 y.o. G2P0010 who presents today with vaginal bleeding. She states that around 1545 she passed several "large" clots, and has been bleeding similar to a period. She denies any pain at this time. She states that she took a HPT about a month ago.   Past Medical History  Diagnosis Date  . Obesity   . PCOS (polycystic ovarian syndrome)     "When I was young". No medical intervention.  . Infected tooth   . Bipolar 1 disorder     Past Surgical History  Procedure Laterality Date  . Tonsillectomy    . Dilation and curettage of uterus N/A 09/03/2012    Procedure: SUCTION DILATATION AND CURETTAGE;  Surgeon: Lazaro Arms, MD;  Location: [facility];  Service: Gynecology;  Laterality: N/A;    Family History  Problem Relation Age of Onset  . Diabetes Paternal Grandmother   . Diabetes Paternal Grandfather   . Cancer Mother     cervicle  . Cancer Paternal Uncle     lung  . Cancer Maternal Grandmother     lung  . Cancer Maternal Grandfather     throat    History  Substance Use Topics  . Smoking status: Former Smoker -- 1.00 packs/day for 2 years    Types: Cigarettes    Quit date: 08/25/2012  . Smokeless tobacco: Never Used  . Alcohol Use: No     Comment: Rare; not now    Allergies:  Allergies  Allergen Reactions  . Carbamazepine Nausea And Vomiting    No prescriptions prior to admission    Review of Systems  Genitourinary: Positive for vaginal bleeding.   Physical Exam   Blood pressure 130/82, pulse 101, temperature 98.1 F (36.7 C), resp. rate 20, height 5\' 6"  (1.676 m), weight 131.543 kg (290 lb), last menstrual period 02/11/2013, SpO2 100.00%.  Physical Exam  Nursing note and vitals reviewed. Constitutional: She is  oriented to person, place, and time. She appears well-developed and well-nourished. No distress.  Cardiovascular: Normal rate.   Respiratory: Effort normal.  GI: Soft. There is no tenderness. There is no rebound.  Genitourinary:   External: no lesion Vagina: small amount of blood seen  Cervix: pink, smooth, no CMT    Neurological: She is alert and oriented to person, place, and time.  Skin: Skin is warm and dry.  Psychiatric: She has a normal mood and affect.    MAU Course  Procedures  Results for orders placed during the hospital encounter of 04/08/13 (from the past 24 hour(s))  URINALYSIS, ROUTINE W REFLEX MICROSCOPIC     Status: Abnormal   Collection Time    04/08/13  4:30 PM      Result Value Ref Range   Color, Urine RED (*) YELLOW   APPearance TURBID (*) CLEAR   Specific Gravity, Urine 1.025  1.005 - 1.030   pH 6.0  5.0 - 8.0   Glucose, UA NEGATIVE  NEGATIVE mg/dL   Hgb urine dipstick LARGE (*) NEGATIVE   Bilirubin Urine NEGATIVE  NEGATIVE   Ketones, ur NEGATIVE  NEGATIVE mg/dL   Protein, ur 30 (*) NEGATIVE mg/dL   Urobilinogen, UA 0.2  0.0 - 1.0 mg/dL   Nitrite NEGATIVE  NEGATIVE   Leukocytes, UA NEGATIVE  NEGATIVE  URINE MICROSCOPIC-ADD ON     Status: Abnormal   Collection Time    04/08/13  4:30 PM      Result Value Ref Range   Squamous Epithelial / LPF MANY (*) RARE   Bacteria, UA FEW (*) RARE   Urine-Other FIELD OBSCURED BY RBC'S    HCG, QUANTITATIVE, PREGNANCY     Status: Abnormal   Collection Time    04/08/13  4:44 PM      Result Value Ref Range   hCG, Beta Chain, Quant, S 1873 (*) <5 mIU/mL  POCT PREGNANCY, URINE     Status: Abnormal   Collection Time    04/08/13  4:44 PM      Result Value Ref Range   Preg Test, Ur POSITIVE (*) NEGATIVE  CBC     Status: Abnormal   Collection Time    04/08/13  4:45 PM      Result Value Ref Range   WBC 13.6 (*) 4.0 - 10.5 K/uL   RBC 4.60  3.87 - 5.11 MIL/uL   Hemoglobin 13.9  12.0 - 15.0 g/dL   HCT 16.1  09.6 -  04.5 %   MCV 89.3  78.0 - 100.0 fL   MCH 30.2  26.0 - 34.0 pg   MCHC 33.8  30.0 - 36.0 g/dL   RDW 40.9  81.1 - 91.4 %   Platelets 401 (*) 150 - 400 K/uL  WET PREP, GENITAL     Status: Abnormal   Collection Time    04/08/13  5:40 PM      Result Value Ref Range   Yeast Wet Prep HPF POC NONE SEEN  NONE SEEN   Trich, Wet Prep NONE SEEN  NONE SEEN   Clue Cells Wet Prep HPF POC NONE SEEN  NONE SEEN   WBC, Wet Prep HPF POC FEW (*) NONE SEEN   US Ob Comp Less 14 Wks  04/08/2013   CLINICAL DATA:  Pregnant, vaginal bleeding, beta HCG 1873  EXAM: OBSTETRIC <14 WK [place] AND TRANSVAGINAL OB US  TECHNIQUE: Both transabdominal and transvaginal ultrasound examinations were performed for complete evaluation of the gestation as well as the maternal uterus, adnexal regions, and pelvic cul-de-sac. Transvaginal technique was performed to assess early pregnancy.  COMPARISON:  None.  FINDINGS: Intrauterine gestational sac: Visualized/normal in shape.  Yolk sac:  Present  Embryo:  Present  Cardiac Activity: Present  Heart Rate:  107 bpm  CRL:   6.8  mm   6 w 4 d                  [place] EDC: 11/28/2013  Maternal uterus/adnexae: No subchorionic hemorrhage.  Right ovary is not discretely visualized.  No adnexal mass.  Left ovary measures 2.4 x 1.7 x 1.6 cm and is notable for a corpus luteal cyst.  No free fluid.  IMPRESSION: Single live intrauterine gestation with estimated gestational age [redacted] weeks 4 days by crown-rump length.   Electronically Signed   By: Charline Bills M.D.   On: 04/08/2013 18:31   US Ob Transvaginal  04/08/2013   CLINICAL DATA:  Pregnant, vaginal bleeding, beta HCG 1873  EXAM: OBSTETRIC <14 WK [place] AND TRANSVAGINAL OB US  TECHNIQUE: Both transabdominal and transvaginal ultrasound examinations were performed for complete evaluation of the gestation as well as the maternal uterus, adnexal regions, and pelvic cul-de-sac. Transvaginal technique was performed to assess early pregnancy.  COMPARISON:  None.  FINDINGS:  Intrauterine gestational sac: Visualized/normal in shape.  Yolk sac:  Present  Embryo:  Present  Cardiac Activity: Present  Heart Rate:  107 bpm  CRL:   6.8  mm   6 w 4 d                  US EDC: 11/28/2013  Maternal uterus/adnexae: No subchorionic hemorrhage.  Right ovary is not discretely visualized.  No adnexal mass.  Left ovary measures 2.4 x 1.7 x 1.6 cm and is notable for a corpus luteal cyst.  No free fluid.  IMPRESSION: Single live intrauterine gestation with estimated gestational age [redacted] weeks 4 days by crown-rump length.   Electronically Signed   By: Charline BillsSriyesh  Krishnan M.D.   On: 04/08/2013 18:31    Assessment and Plan   1. Vaginal bleeding in pregnant patient at less than [redacted] weeks gestation    Bleeding precautions First trimester precautions reviewed Return to [facility] as needed [facility] as possible  Follow-up Information   Schedule an appointment as soon as possible [facility] [place].   Contact information:   [address] [place] [address] [phone]       Tawnya CrookHogan, Legend Pecore Donovan 04/08/2013, 5:43 PM

## 2013-04-08 NOTE — MAU Note (Signed)
Pos HPT one month ago, had pos UPT at a shelter?  Had episode of bleeding @ 1540, large clot in pants, and one in toilet.  Denies pain.

## 2013-04-09 LAB — GC/CHLAMYDIA PROBE AMP
CT PROBE, AMP APTIMA: NEGATIVE
GC Probe RNA: NEGATIVE

## 2013-04-10 ENCOUNTER — Encounter (HOSPITAL_COMMUNITY): Payer: Self-pay | Admitting: *Deleted

## 2013-04-10 ENCOUNTER — Inpatient Hospital Stay (HOSPITAL_COMMUNITY)
Admission: AD | Admit: 2013-04-10 | Discharge: 2013-04-11 | Disposition: A | Discharge status: Home / Self Care | Payer: Managed Care, Other (non HMO) | Source: Ambulatory Visit | Attending: Obstetrics & Gynecology | Admitting: Obstetrics & Gynecology

## 2013-04-10 DIAGNOSIS — O039 Complete or unspecified spontaneous abortion without complication: Secondary | ICD-10-CM | POA: Insufficient documentation

## 2013-04-10 DIAGNOSIS — Z87891 Personal history of nicotine dependence: Secondary | ICD-10-CM | POA: Insufficient documentation

## 2013-04-10 LAB — HCG, QUANTITATIVE, PREGNANCY: hCG, Beta Chain, Quant, S: 1628 m[IU]/mL — ABNORMAL HIGH (ref <5)

## 2013-04-10 NOTE — Progress Notes (Signed)
Sara CarbonJennifer Rasch NP in and has rethought plan of care. Will do u/s tonight

## 2013-04-10 NOTE — MAU Provider Note (Signed)
History     CSN: [id number]  Arrival date and time: 04/10/13 2022   None     Chief Complaint  Patient presents with  . Follow-up   HPI  Ms. Kaileigh Viswanathan is a 23 y.o. female G2P0010 at [redacted]w[redacted]d who presents with concerns regarding information she received after a visit in MAU 2 days ago. The pt was seen in MAU for bleeding and pain two days ago; US showed IUP, with yolk sac and cardiac activity. The patient continued to bleed over the last two days, today the bleeding is much less. She is very nervous and is concerned that she is losing the pregnancy due to having miscarriage in the past.   OB History   Grav Para Term Preterm Abortions TAB SAB Ect Mult Living   2    1  1    0      Past Medical History  Diagnosis Date  . Obesity   . PCOS (polycystic ovarian syndrome)     "When I was young". No medical intervention.  . Infected tooth   . Bipolar 1 disorder     Past Surgical History  Procedure Laterality Date  . Tonsillectomy    . Dilation and curettage of uterus N/A 09/03/2012    Procedure: SUCTION DILATATION AND CURETTAGE;  Surgeon: Lazaro Arms, MD;  Location: [facility];  Service: Gynecology;  Laterality: N/A;    Family History  Problem Relation Age of Onset  . Diabetes Paternal Grandmother   . Diabetes Paternal Grandfather   . Cancer Mother     cervicle  . Cancer Paternal Uncle     lung  . Cancer Maternal Grandmother     lung  . Cancer Maternal Grandfather     throat    History  Substance Use Topics  . Smoking status: Former Smoker -- 1.00 packs/day for 2 years    Types: Cigarettes    Quit date: 08/25/2012  . Smokeless tobacco: Never Used  . Alcohol Use: No     Comment: Rare; not now    Allergies:  Allergies  Allergen Reactions  . Carbamazepine Nausea And Vomiting    Prescriptions prior to admission  Medication Sig Dispense Refill  . Prenatal Vit-Fe Fumarate-FA (PRENATAL MULTIVITAMIN) TABS tablet Take 1 tablet by mouth daily at 12 noon.        Results for orders placed during the hospital encounter of 04/10/13 (from the past 48 hour(s))  HCG, QUANTITATIVE, PREGNANCY     Status: Abnormal   Collection Time    04/10/13  9:00 PM      Result Value Ref Range   hCG, Beta Chain, Quant, S 1628 (*) <5 mIU/mL   Comment:              GEST. AGE      CONC.  (mIU/mL)       <=1 WEEK        5 - 50         2 WEEKS       50 - 500         3 WEEKS       100 - 10,000         4 WEEKS     1,000 - 30,000         5 WEEKS     3,500 - 115,000       6-8 WEEKS     12,000 - 270,000        12 WEEKS  15,000 - 220,000                FEMALE AND NON-PREGNANT FEMALE:         LESS THAN 5 mIU/mL   [place] Ob Transvaginal  04/11/2013   CLINICAL DATA:  Decreasing beta HCG, evaluate for viability  EXAM: TRANSVAGINAL OB ULTRASOUND  TECHNIQUE: Transvaginal ultrasound was performed for complete evaluation of the gestation as well as the maternal uterus, adnexal regions, and pelvic cul-de-sac.  COMPARISON:  04/08/2013  FINDINGS: Intrauterine gestational sac: Not visualized  Maternal uterus/adnexae: Endometrial complex measures 12 mm. No definite vascularity.  Right ovary is within normal limits.  Left ovary is notable for a corpus luteal cyst.  Small volume pelvic ascites.  IMPRESSION: Prior IUP is no longer visualized, compatible with missed abortion/abortion in progress.  Endometrial complex measures 12 mm.  No definite vascularity.   Electronically Signed   By: Charline BillsSriyesh  Krishnan M.D.   On: 04/11/2013 00:40    Review of Systems  Constitutional: Negative for fever and chills.  Gastrointestinal: Positive for nausea. Negative for abdominal pain.  Genitourinary: Negative for dysuria, urgency, frequency and hematuria.       No vaginal discharge. + vaginal bleeding; scant  No dysuria.    Physical Exam   Temperature 98.5 F (36.9 C), resp. rate 20, height 5\' 6"  (1.676 m), weight 130.727 kg (288 lb 3.2 oz), last menstrual period 02/11/2013.  Physical Exam   Constitutional: She appears well-developed and well-nourished. No distress.  HENT:  Head: Normocephalic.  Eyes: Pupils are equal, round, and reactive to light.  Neck: Neck supple.  Respiratory: Effort normal.  GI: Soft. She exhibits no distension. There is no tenderness. There is no rebound and no guarding.  Genitourinary:  Bimanual exam: Cervix FT, thick, anterior. Scant amount of blood noted on glove.  Chaperone present for exam.   Musculoskeletal: Normal range of motion.  Neurological: She is alert.  Skin: Skin is warm. She is not diaphoretic.  Psychiatric: Her behavior is normal.  Pt is tearful.     MAU Course  Procedures None  MDM Beta hcg Previous Quant was 1800 US for viability.  O positive blood type    Assessment and Plan   A:  1. SAB (spontaneous abortion)    P:  Discharge home in stable condition Follow up in the clinic in 1 week for repeat beta hcg Bleeding precautions Pelvic rest Return to MAU as needed, if symptoms worsen.   Iona HansenJennifer Irene Raymie Giammarco, NP  04/10/2013, 12:49 AM

## 2013-04-10 NOTE — MAU Note (Addendum)
Was here 2 days ago with heavy bleeding. Here for reck of labs. Continue to having bleeding and now cramping some. Bleeding is not as heavy as 2 days ago

## 2013-04-11 ENCOUNTER — Inpatient Hospital Stay (HOSPITAL_COMMUNITY): Payer: Managed Care, Other (non HMO)

## 2013-04-11 NOTE — Progress Notes (Signed)
Jennifer Rasch NP in earlier to discuss test results and d/c plan. Written and verbal d/c instructions given and understanding voiced. 

## 2013-04-11 NOTE — Discharge Instructions (Signed)
Miscarriage  A miscarriage is the loss of an unborn baby (fetus) before the 20th week of pregnancy. The cause is often unknown.   HOME CARE  · You may need to stay in bed (bed rest), or you may be able to do light activity. Go about activity as told by your doctor.  · Have help at home.  · Write down how many pads you use each day. Write down how soaked they are.  · Do not use tampons. Do not wash out your vagina (douche) or have sex (intercourse) until your doctor approves.  · Only take medicine as told by your doctor.  · Do not take aspirin.  · Keep all doctor visits as told.  · If you or your partner have problems with grieving, talk to your doctor. You can also try counseling. Give yourself time to grieve before trying to get pregnant again.  GET HELP RIGHT AWAY IF:  · You have bad cramps or pain in your back or belly (abdomen).  · You have a fever.  · You pass large clumps of blood (clots) from your vagina that are walnut-sized or larger. Save the clumps for your doctor to see.  · You pass large amounts of tissue from your vagina. Save the tissue for your doctor to see.  · You have more bleeding.  · You have thick, bad-smelling fluid (discharge) coming from the vagina.  · You get lightheaded, weak, or you pass out (faint).  · You have chills.  MAKE SURE YOU:  · Understand these instructions.  · Will watch your condition.  · Will get help right away if you are not doing well or get worse.  Document Released: 03/26/2011 Document Reviewed: 03/26/2011  Exit[employer]® Patient Information ©2014 [employer].

## 2013-04-20 ENCOUNTER — Other Ambulatory Visit: Payer: Managed Care, Other (non HMO)

## 2013-04-20 DIAGNOSIS — E349 Endocrine disorder, unspecified: Secondary | ICD-10-CM

## 2013-04-21 LAB — HCG, QUANTITATIVE, PREGNANCY: HCG, BETA CHAIN, QUANT, S: 2.6 m[IU]/mL

## 2013-07-07 ENCOUNTER — Encounter (HOSPITAL_COMMUNITY): Payer: Self-pay

## 2013-09-26 ENCOUNTER — Emergency Department: Payer: Self-pay | Admitting: Emergency Medicine

## 2013-11-02 ENCOUNTER — Inpatient Hospital Stay (HOSPITAL_COMMUNITY)
Admission: AD | Admit: 2013-11-02 | Discharge: 2013-11-02 | Disposition: A | Discharge status: Home / Self Care | Payer: Managed Care, Other (non HMO) | Source: Ambulatory Visit | Attending: Family Medicine | Admitting: Family Medicine

## 2013-11-02 ENCOUNTER — Encounter (HOSPITAL_COMMUNITY): Payer: Self-pay

## 2013-11-02 DIAGNOSIS — R109 Unspecified abdominal pain: Secondary | ICD-10-CM | POA: Diagnosis not present

## 2013-11-02 DIAGNOSIS — Z87891 Personal history of nicotine dependence: Secondary | ICD-10-CM | POA: Diagnosis not present

## 2013-11-02 DIAGNOSIS — R112 Nausea with vomiting, unspecified: Secondary | ICD-10-CM | POA: Diagnosis not present

## 2013-11-02 DIAGNOSIS — Z3A20 20 weeks gestation of pregnancy: Secondary | ICD-10-CM | POA: Insufficient documentation

## 2013-11-02 DIAGNOSIS — O26892 Other specified pregnancy related conditions, second trimester: Secondary | ICD-10-CM | POA: Diagnosis not present

## 2013-11-02 DIAGNOSIS — O219 Vomiting of pregnancy, unspecified: Secondary | ICD-10-CM

## 2013-11-02 DIAGNOSIS — O9989 Other specified diseases and conditions complicating pregnancy, childbirth and the puerperium: Secondary | ICD-10-CM

## 2013-11-02 DIAGNOSIS — O26899 Other specified pregnancy related conditions, unspecified trimester: Secondary | ICD-10-CM

## 2013-11-02 DIAGNOSIS — O0932 Supervision of pregnancy with insufficient antenatal care, second trimester: Secondary | ICD-10-CM

## 2013-11-02 LAB — URINALYSIS, ROUTINE W REFLEX MICROSCOPIC
BILIRUBIN URINE: NEGATIVE
Glucose, UA: NEGATIVE mg/dL
Hgb urine dipstick: NEGATIVE
Ketones, ur: 15 mg/dL — AB
NITRITE: NEGATIVE
PH: 7.5 (ref 5.0–8.0)
Protein, ur: NEGATIVE mg/dL
SPECIFIC GRAVITY, URINE: 1.015 (ref 1.005–1.030)
Urobilinogen, UA: 0.2 mg/dL (ref 0.0–1.0)

## 2013-11-02 LAB — POCT PREGNANCY, URINE: Preg Test, Ur: POSITIVE — AB

## 2013-11-02 LAB — CBC
HEMATOCRIT: 37.8 % (ref 36.0–46.0)
Hemoglobin: 13 g/dL (ref 12.0–15.0)
MCH: 30.5 pg (ref 26.0–34.0)
MCHC: 34.4 g/dL (ref 30.0–36.0)
MCV: 88.7 fL (ref 78.0–100.0)
PLATELETS: 290 10*3/uL (ref 150–400)
RBC: 4.26 MIL/uL (ref 3.87–5.11)
RDW: 13.3 % (ref 11.5–15.5)
WBC: 8.9 10*3/uL (ref 4.0–10.5)

## 2013-11-02 LAB — WET PREP, GENITAL
Clue Cells Wet Prep HPF POC: NONE SEEN
Trich, Wet Prep: NONE SEEN
Yeast Wet Prep HPF POC: NONE SEEN

## 2013-11-02 LAB — URINE MICROSCOPIC-ADD ON

## 2013-11-02 LAB — HIV ANTIBODY (ROUTINE TESTING W REFLEX): HIV: NONREACTIVE

## 2013-11-02 NOTE — Discharge Instructions (Signed)

## 2013-11-02 NOTE — MAU Provider Note (Signed)
History     CSN: [id number]  Arrival date and time: 11/02/13 [id number]   First Provider Initiated Contact with Patient 11/02/13 1044      Chief Complaint  Patient presents with  . Hematemesis  . Abdominal Cramping   HPI  [name] is a 23 y.o. female G3P0020 who presents to MAU with complaints of heartburn, vomiting and abdominal pain. She is vomiting everyday however feels like it is controlled if she puts something on her stomach. The abdominal pain started last night; it is specfically in her RLQ; the pain comes and goes. Sometimes its sharp and sometimes it is cramp like pain. It hurts worse when she is up moving around.  [facility]. She still needs to apply for medicaid.   OB History   Grav Para Term Preterm Abortions TAB SAB Ect Mult Living   3    2  2    0      Past Medical History  Diagnosis Date  . Obesity   . PCOS (polycystic ovarian syndrome)     "When I was young". No medical intervention.  . Infected tooth   . Bipolar 1 disorder     Past Surgical History  Procedure Laterality Date  . Tonsillectomy    . Dilation and curettage of uterus N/A 09/03/2012    Procedure: SUCTION DILATATION AND CURETTAGE;  Surgeon: Lazaro ArmsLuther H Eure, MD;  Location: [facility];  Service: Gynecology;  Laterality: N/A;    Family History  Problem Relation Age of Onset  . Diabetes Paternal Grandmother   . Diabetes Paternal Grandfather   . Cancer Mother     cervicle  . Cancer Paternal Uncle     lung  . Cancer Maternal Grandmother     lung  . Cancer Maternal Grandfather     throat    History  Substance Use Topics  . Smoking status: Former Smoker -- 1.00 packs/day for 2 years    Types: Cigarettes    Quit date: 08/25/2012  . Smokeless tobacco: Never Used  . Alcohol Use: No     Comment: Rare; not now    Allergies:  Allergies  Allergen Reactions  . Carbamazepine Nausea And Vomiting    Prescriptions prior to admission   Medication Sig Dispense Refill  . Prenatal Vit-Fe Fumarate-FA (PRENATAL MULTIVITAMIN) TABS tablet Take 1 tablet by mouth daily at 12 noon.       Results for orders placed during the hospital encounter of 11/02/13 (from the past 48 hour(s))  URINALYSIS, ROUTINE W REFLEX MICROSCOPIC     Status: Abnormal   Collection Time    11/02/13  9:50 AM      Result Value Ref Range   Color, Urine YELLOW  YELLOW   APPearance CLOUDY (*) CLEAR   Specific Gravity, Urine 1.015  1.005 - 1.030   pH 7.5  5.0 - 8.0   Glucose, UA NEGATIVE  NEGATIVE mg/dL   Hgb urine dipstick NEGATIVE  NEGATIVE   Bilirubin Urine NEGATIVE  NEGATIVE   Ketones, ur 15 (*) NEGATIVE mg/dL   Protein, ur NEGATIVE  NEGATIVE mg/dL   Urobilinogen, UA 0.2  0.0 - 1.0 mg/dL   Nitrite NEGATIVE  NEGATIVE   Leukocytes, UA SMALL (*) NEGATIVE  URINE MICROSCOPIC-ADD ON     Status: Abnormal   Collection Time    11/02/13  9:50 AM      Result Value Ref Range   Squamous Epithelial / LPF  MANY (*) RARE   WBC, UA 0-2  <3 WBC/hpf   Bacteria, UA FEW (*) RARE   Urine-Other MUCOUS PRESENT    POCT PREGNANCY, URINE     Status: Abnormal   Collection Time    11/02/13 10:13 AM      Result Value Ref Range   Preg Test, Ur POSITIVE (*) NEGATIVE   Comment:            THE SENSITIVITY OF THIS     METHODOLOGY IS >24 mIU/mL  WET PREP, GENITAL     Status: Abnormal   Collection Time    11/02/13 10:55 AM      Result Value Ref Range   Yeast Wet Prep HPF POC NONE SEEN  NONE SEEN   Trich, Wet Prep NONE SEEN  NONE SEEN   Clue Cells Wet Prep HPF POC NONE SEEN  NONE SEEN   WBC, Wet Prep HPF POC FEW (*) NONE SEEN   Comment: MANY BACTERIA SEEN  CBC     Status: None   Collection Time    11/02/13 11:05 AM      Result Value Ref Range   WBC 8.9  4.0 - 10.5 K/uL   RBC 4.26  3.87 - 5.11 MIL/uL   Hemoglobin 13.0  12.0 - 15.0 g/dL   HCT 16.1  09.6 - 04.5 %   MCV 88.7  78.0 - 100.0 fL   MCH 30.5  26.0 - 34.0 pg   MCHC 34.4  30.0 - 36.0 g/dL   RDW 40.9  81.1 - 91.4  %   Platelets 290  150 - 400 K/uL    Review of Systems  Constitutional: Negative for fever and chills.  Gastrointestinal: Positive for heartburn, nausea, vomiting, abdominal pain and constipation. Negative for diarrhea.  Genitourinary:       + vaginal discharge: "looks like lotion" No vaginal bleeding. No dysuria.    Physical Exam   Blood pressure 126/75, pulse 91, temperature 97.6 F (36.4 C), temperature source Oral, resp. rate 18, height 5' 6.5" (1.689 m), weight 127.461 kg (281 lb), last menstrual period 06/29/2013, SpO2 100.00%, unknown if currently breastfeeding.  Physical Exam  Constitutional: She is oriented to person, place, and time. She appears well-developed and well-nourished.  Non-toxic appearance. She does not have a sickly appearance. She does not appear ill. No distress.  HENT:  Head: Normocephalic.  Eyes: Pupils are equal, round, and reactive to light.  Neck: Neck supple.  Respiratory: Effort normal.  GI: Soft. She exhibits no distension and no mass. There is no tenderness. There is no rebound and no guarding.  Genitourinary:  Speculum exam: Vagina - Small amount of creamy discharge, no odor Cervix - No contact bleeding Bimanual exam: Cervix closed Uterus non tender, normal size Adnexa non tender, no masses bilaterally GC/Chlam, wet prep done Chaperone present for exam.   Musculoskeletal: Normal range of motion.  Neurological: She is alert and oriented to person, place, and time.  Skin: Skin is warm. She is not diaphoretic.  Psychiatric: Her behavior is normal.    MAU Course  Procedures None  MDM Wet prep GC CBC Outpatient [place] scheduled +fetal heart tones by doppler    Assessment and Plan   A: 1. Abdominal pain in pregnancy; likely round ligament pain   2. Nausea and vomiting in pregnancy   3. No prenatal care in current pregnancy in second trimester    P: Discharge home in stable condition [place] will call to schedule your [place].  Return  to  MAU for emergencies Start prenatal care ASAP  Change positions slowly.    Iona HansenJennifer Irene Rima Blizzard, NP 11/02/2013 2:07 PM

## 2013-11-02 NOTE — MAU Provider Note (Signed)
Attestation of Attending Supervision of Advanced Practitioner (PA/CNM/NP): Evaluation and management procedures were performed by the Advanced Practitioner under my supervision and collaboration.  I have reviewed the Advanced Practitioner's note and chart, and I agree with the management and plan.  Turkessa Ostrom, DO Attending Physician Faculty Practice, [facility]  

## 2013-11-02 NOTE — MAU Note (Signed)
Patient presents to MAU with c/o of blood in emesis x 2days and lower right abdominal cramping. LMP in June; unsure of exact date. Denies vaginal bleeding; reports milky discharge.

## 2013-11-03 LAB — GC/CHLAMYDIA PROBE AMP
CT Probe RNA: NEGATIVE
GC PROBE AMP APTIMA: NEGATIVE

## 2013-11-16 ENCOUNTER — Encounter (HOSPITAL_COMMUNITY): Payer: Self-pay

## 2013-11-16 ENCOUNTER — Ambulatory Visit (HOSPITAL_COMMUNITY)
Admission: RE | Admit: 2013-11-16 | Discharge: 2013-11-16 | Disposition: A | Discharge status: Home / Self Care | Payer: Managed Care, Other (non HMO) | Source: Ambulatory Visit | Attending: Obstetrics and Gynecology | Admitting: Obstetrics and Gynecology

## 2013-11-16 DIAGNOSIS — Z3A14 14 weeks gestation of pregnancy: Secondary | ICD-10-CM | POA: Diagnosis not present

## 2013-11-16 DIAGNOSIS — Z36 Encounter for antenatal screening of mother: Secondary | ICD-10-CM | POA: Insufficient documentation

## 2013-11-16 DIAGNOSIS — O0932 Supervision of pregnancy with insufficient antenatal care, second trimester: Secondary | ICD-10-CM | POA: Diagnosis not present

## 2013-11-16 DIAGNOSIS — Z1389 Encounter for screening for other disorder: Secondary | ICD-10-CM | POA: Insufficient documentation

## 2013-11-16 DIAGNOSIS — R109 Unspecified abdominal pain: Secondary | ICD-10-CM | POA: Diagnosis not present

## 2013-11-16 DIAGNOSIS — O9989 Other specified diseases and conditions complicating pregnancy, childbirth and the puerperium: Secondary | ICD-10-CM | POA: Insufficient documentation

## 2013-11-16 DIAGNOSIS — O26899 Other specified pregnancy related conditions, unspecified trimester: Secondary | ICD-10-CM

## 2014-01-10 ENCOUNTER — Encounter (HOSPITAL_COMMUNITY): Payer: Self-pay

## 2014-01-10 ENCOUNTER — Inpatient Hospital Stay (HOSPITAL_COMMUNITY)
Admission: AD | Admit: 2014-01-10 | Discharge: 2014-01-10 | Disposition: A | Discharge status: Home / Self Care | Payer: Managed Care, Other (non HMO) | Source: Ambulatory Visit | Attending: Obstetrics and Gynecology | Admitting: Obstetrics and Gynecology

## 2014-01-10 DIAGNOSIS — R0981 Nasal congestion: Secondary | ICD-10-CM | POA: Insufficient documentation

## 2014-01-10 DIAGNOSIS — O26812 Pregnancy related exhaustion and fatigue, second trimester: Secondary | ICD-10-CM | POA: Diagnosis not present

## 2014-01-10 DIAGNOSIS — J029 Acute pharyngitis, unspecified: Secondary | ICD-10-CM | POA: Diagnosis not present

## 2014-01-10 DIAGNOSIS — O9989 Other specified diseases and conditions complicating pregnancy, childbirth and the puerperium: Secondary | ICD-10-CM | POA: Diagnosis not present

## 2014-01-10 DIAGNOSIS — J069 Acute upper respiratory infection, unspecified: Secondary | ICD-10-CM

## 2014-01-10 DIAGNOSIS — Z3A22 22 weeks gestation of pregnancy: Secondary | ICD-10-CM | POA: Insufficient documentation

## 2014-01-10 DIAGNOSIS — Z87891 Personal history of nicotine dependence: Secondary | ICD-10-CM | POA: Insufficient documentation

## 2014-01-10 LAB — URINALYSIS, ROUTINE W REFLEX MICROSCOPIC
BILIRUBIN URINE: NEGATIVE
Glucose, UA: NEGATIVE mg/dL
Hgb urine dipstick: NEGATIVE
Ketones, ur: NEGATIVE mg/dL
Leukocytes, UA: NEGATIVE
Nitrite: NEGATIVE
PH: 7 (ref 5.0–8.0)
PROTEIN: NEGATIVE mg/dL
SPECIFIC GRAVITY, URINE: 1.015 (ref 1.005–1.030)
UROBILINOGEN UA: 0.2 mg/dL (ref 0.0–1.0)

## 2014-01-10 NOTE — MAU Note (Signed)
Pt states here for sore throat and swollen lymph nodes. S/S x1 week. Has slight cough. No fever or chills. No issues with pregnancy.

## 2014-01-10 NOTE — MAU Provider Note (Signed)
History     CSN: [id number]  Arrival date and time: 01/10/14 1250   None     Chief Complaint  Patient presents with  . URI   HPI  Patient is 23 y.o. [id number] 4770w3d here with complaints of "really sick", head congestion, sore throat, nasal congestion that is worse at night.  Symptoms x 3-4 days.  Nothing improves, has not taken any medication.  Overall symptoms worse at night.  +FM, denies LOF, VB, contractions     Past Medical History  Diagnosis Date  . Obesity   . PCOS (polycystic ovarian syndrome)     "When I was young". No medical intervention.  . Infected tooth   . Bipolar 1 disorder     Past Surgical History  Procedure Laterality Date  . Tonsillectomy    . Dilation and curettage of uterus N/A 09/03/2012    Procedure: SUCTION DILATATION AND CURETTAGE;  Surgeon: Lazaro ArmsLuther H Eure, MD;  Location: [facility];  Service: Gynecology;  Laterality: N/A;    Family History  Problem Relation Age of Onset  . Diabetes Paternal Grandmother   . Diabetes Paternal Grandfather   . Cancer Mother     cervicle  . Cancer Paternal Uncle     lung  . Cancer Maternal Grandmother     lung  . Cancer Maternal Grandfather     throat    History  Substance Use Topics  . Smoking status: Former Smoker -- 1.00 packs/day for 2 years    Types: Cigarettes    Quit date: 08/25/2012  . Smokeless tobacco: Never Used  . Alcohol Use: No     Comment: Rare; not now    Allergies:  Allergies  Allergen Reactions  . Carbamazepine Nausea And Vomiting    Prescriptions prior to admission  Medication Sig Dispense Refill Last Dose  . ibuprofen (ADVIL,MOTRIN) 800 MG tablet Take 800 mg by mouth once.   Past Week at Unknown time  . Prenatal Vit-Fe Fumarate-FA (PRENATAL MULTIVITAMIN) TABS tablet Take 1 tablet by mouth daily at 12 noon.   01/08/2014    Review of Systems  Constitutional: Positive for malaise/fatigue. Negative for fever and chills.  Respiratory: Positive for cough (very mild, "not much").  Negative for shortness of breath.   Cardiovascular: Negative for chest pain and leg swelling.  Gastrointestinal: Positive for nausea and vomiting (baseline when doesn't eat). Negative for heartburn and diarrhea.  Genitourinary: Positive for frequency (baseline). Negative for dysuria, urgency and hematuria.  Neurological: Positive for headaches.   Physical Exam   Blood pressure 134/78, pulse 111, temperature 98.1 F (36.7 C), temperature source Oral, resp. rate 18, height 5\' 6"  (1.676 m), weight 282 lb 6 oz (128.084 kg), last menstrual period 06/29/2013, SpO2 99 %, not currently breastfeeding.  Physical Exam  Constitutional: She is oriented to person, place, and time. She appears well-developed and well-nourished.  Non-ill appearing  HENT:  Head: Normocephalic and atraumatic.  Eyes: Conjunctivae and EOM are normal.  Neck: Normal range of motion.  Cardiovascular: Normal rate.   Respiratory: Effort normal. No respiratory distress. She has no wheezes. She has no rales. She exhibits no tenderness.  GI: Soft. Bowel sounds are normal. She exhibits no distension. There is no tenderness.  Musculoskeletal: Normal range of motion. She exhibits no edema.  Neurological: She is alert and oriented to person, place, and time.  Skin: Skin is warm and dry. No erythema.    MAU Course  Procedures  MDM   Assessment and Plan  Patient is  23 y.o. [id number] 2911w4d reporting malaise, nasal congestion likely secondary to upper respiratory infection, cold and would like pregnancy verification - given list of medications to take that are safe in pregnancy - to return for worsening of symptoms, fever, shortness of breath   Ilario Dhaliwal ROCIO 01/10/2014, 2:08 PM

## 2014-01-10 NOTE — Discharge Instructions (Signed)
Upper Respiratory Infection, Adult An upper respiratory infection (URI) is also sometimes known as the common cold. The upper respiratory tract includes the nose, sinuses, throat, trachea, and bronchi. Bronchi are the airways leading to the lungs. Most people improve within 1 week, but symptoms can last up to 2 weeks. A residual cough may last even longer.  CAUSES Many different viruses can infect the tissues lining the upper respiratory tract. The tissues become irritated and inflamed and often become very moist. Mucus production is also common. A cold is contagious. You can easily spread the virus to others by oral contact. This includes kissing, sharing a glass, coughing, or sneezing. Touching your mouth or nose and then touching a surface, which is then touched by another person, can also spread the virus. SYMPTOMS  Symptoms typically develop 1 to 3 days after you come in contact with a cold virus. Symptoms vary from person to person. They may include:  Runny nose.  Sneezing.  Nasal congestion.  Sinus irritation.  Sore throat.  Loss of voice (laryngitis).  Cough.  Fatigue.  Muscle aches.  Loss of appetite.  Headache.  Low-grade fever. DIAGNOSIS  You might diagnose your own cold based on familiar symptoms, since most people get a cold 2 to 3 times a year. Your caregiver can confirm this based on your exam. Most importantly, your caregiver can check that your symptoms are not due to another disease such as strep throat, sinusitis, pneumonia, asthma, or epiglottitis. Blood tests, throat tests, and X-rays are not necessary to diagnose a common cold, but they may sometimes be helpful in excluding other more serious diseases. Your caregiver will decide if any further tests are required. RISKS AND COMPLICATIONS  You may be at risk for a more severe case of the common cold if you smoke cigarettes, have chronic heart disease (such as heart failure) or lung disease (such as asthma), or if  you have a weakened immune system. The very young and very old are also at risk for more serious infections. Bacterial sinusitis, middle ear infections, and bacterial pneumonia can complicate the common cold. The common cold can worsen asthma and chronic obstructive pulmonary disease (COPD). Sometimes, these complications can require emergency medical care and may be life-threatening. PREVENTION  The best way to protect against getting a cold is to practice good hygiene. Avoid oral or hand contact with people with cold symptoms. Wash your hands often if contact occurs. There is no clear evidence that vitamin C, vitamin E, echinacea, or exercise reduces the chance of developing a cold. However, it is always recommended to get plenty of rest and practice good nutrition. TREATMENT  Treatment is directed at relieving symptoms. There is no cure. Antibiotics are not effective, because the infection is caused by a virus, not by bacteria. Treatment may include:  Increased fluid intake. Sports drinks offer valuable electrolytes, sugars, and fluids.  Breathing heated mist or steam (vaporizer or shower).  Eating chicken soup or other clear broths, and maintaining good nutrition.  Getting plenty of rest.  Using gargles or lozenges for comfort.  Controlling fevers with ibuprofen or acetaminophen as directed by your caregiver.  Increasing usage of your inhaler if you have asthma. Zinc gel and zinc lozenges, taken in the first 24 hours of the common cold, can shorten the duration and lessen the severity of symptoms. Pain medicines may help with fever, muscle aches, and throat pain. A variety of non-prescription medicines are available to treat congestion and runny nose. Your caregiver   can make recommendations and may suggest nasal or lung inhalers for other symptoms.  HOME CARE INSTRUCTIONS   Only take over-the-counter or prescription medicines for pain, discomfort, or fever as directed by your  caregiver.  Use a warm mist humidifier or inhale steam from a shower to increase air moisture. This may keep secretions moist and make it easier to breathe.  Drink enough water and fluids to keep your urine clear or pale yellow.  Rest as needed.  Return to work when your temperature has returned to normal or as your caregiver advises. You may need to stay home longer to avoid infecting others. You can also use a face mask and careful hand washing to prevent spread of the virus. SEEK MEDICAL CARE IF:   After the first few days, you feel you are getting worse rather than better.  You need your caregiver's advice about medicines to control symptoms.  You develop chills, worsening shortness of breath, or brown or red sputum. These may be signs of pneumonia.  You develop yellow or brown nasal discharge or pain in the face, especially when you bend forward. These may be signs of sinusitis.  You develop a fever, swollen neck glands, pain with swallowing, or white areas in the back of your throat. These may be signs of strep throat. SEEK IMMEDIATE MEDICAL CARE IF:   You have a fever.  You develop severe or persistent headache, ear pain, sinus pain, or chest pain.  You develop wheezing, a prolonged cough, cough up blood, or have a change in your usual mucus (if you have chronic lung disease).  You develop sore muscles or a stiff neck. Document Released: 06/27/2000 Document Revised: 03/26/2011 Document Reviewed: 04/08/2013 ExitCare Patient Information 2015 [employer]. This information is not intended to replace advice given to you by your health care provider. Make sure you discuss any questions you have with your health care provider.  

## 2014-02-05 ENCOUNTER — Encounter (HOSPITAL_COMMUNITY): Payer: Self-pay | Admitting: Emergency Medicine

## 2014-02-05 ENCOUNTER — Emergency Department (HOSPITAL_COMMUNITY)
Admission: EM | Admit: 2014-02-05 | Discharge: 2014-02-05 | Disposition: A | Discharge status: Home / Self Care | Payer: Managed Care, Other (non HMO) | Attending: Emergency Medicine | Admitting: Emergency Medicine

## 2014-02-05 DIAGNOSIS — Z8719 Personal history of other diseases of the digestive system: Secondary | ICD-10-CM | POA: Diagnosis not present

## 2014-02-05 DIAGNOSIS — O210 Mild hyperemesis gravidarum: Secondary | ICD-10-CM | POA: Diagnosis not present

## 2014-02-05 DIAGNOSIS — Z90710 Acquired absence of both cervix and uterus: Secondary | ICD-10-CM | POA: Insufficient documentation

## 2014-02-05 DIAGNOSIS — R197 Diarrhea, unspecified: Secondary | ICD-10-CM

## 2014-02-05 DIAGNOSIS — Z3490 Encounter for supervision of normal pregnancy, unspecified, unspecified trimester: Secondary | ICD-10-CM

## 2014-02-05 DIAGNOSIS — Z3A26 26 weeks gestation of pregnancy: Secondary | ICD-10-CM | POA: Diagnosis not present

## 2014-02-05 DIAGNOSIS — E669 Obesity, unspecified: Secondary | ICD-10-CM | POA: Diagnosis not present

## 2014-02-05 DIAGNOSIS — Z87891 Personal history of nicotine dependence: Secondary | ICD-10-CM | POA: Insufficient documentation

## 2014-02-05 DIAGNOSIS — R112 Nausea with vomiting, unspecified: Secondary | ICD-10-CM

## 2014-02-05 DIAGNOSIS — O9989 Other specified diseases and conditions complicating pregnancy, childbirth and the puerperium: Secondary | ICD-10-CM | POA: Diagnosis present

## 2014-02-05 DIAGNOSIS — Z8659 Personal history of other mental and behavioral disorders: Secondary | ICD-10-CM | POA: Diagnosis not present

## 2014-02-05 DIAGNOSIS — Z79899 Other long term (current) drug therapy: Secondary | ICD-10-CM | POA: Insufficient documentation

## 2014-02-05 LAB — I-STAT CHEM 8, ED
BUN: 3 mg/dL — ABNORMAL LOW (ref 6–23)
CREATININE: 0.5 mg/dL (ref 0.50–1.10)
Calcium, Ion: 1.14 mmol/L (ref 1.12–1.23)
Chloride: 104 mEq/L (ref 96–112)
GLUCOSE: 110 mg/dL — AB (ref 70–99)
HCT: 35 % — ABNORMAL LOW (ref 36.0–46.0)
Hemoglobin: 11.9 g/dL — ABNORMAL LOW (ref 12.0–15.0)
POTASSIUM: 3.7 mmol/L (ref 3.5–5.1)
Sodium: 137 mmol/L (ref 135–145)
TCO2: 18 mmol/L (ref 0–100)

## 2014-02-05 MED ORDER — ONDANSETRON HCL 4 MG/2ML IJ SOLN
4.0000 mg | Freq: Once | INTRAMUSCULAR | Status: AC
  Administered 2014-02-05: 4 mg via INTRAVENOUS
  Filled 2014-02-05: qty 2

## 2014-02-05 MED ORDER — FAMOTIDINE IN NACL 20-0.9 MG/50ML-% IV SOLN
20.0000 mg | Freq: Once | INTRAVENOUS | Status: AC
  Administered 2014-02-05: 20 mg via INTRAVENOUS
  Filled 2014-02-05: qty 50

## 2014-02-05 MED ORDER — SODIUM CHLORIDE 0.9 % IV BOLUS (SEPSIS)
1000.0000 mL | Freq: Once | INTRAVENOUS | Status: AC
  Administered 2014-02-05: 1000 mL via INTRAVENOUS

## 2014-02-05 MED ORDER — ONDANSETRON 4 MG PO TBDP: ORAL_TABLET | ORAL | Status: DC

## 2014-02-05 NOTE — ED Notes (Signed)
Per Dr. Estell HarpinZammit, DC monitoring, discharge pt, [facility] to plans.

## 2014-02-05 NOTE — Discharge Instructions (Signed)
Drink plenty of fluids.   Follow up with your md as needed.  Tylenol for pain

## 2014-02-05 NOTE — ED Provider Notes (Signed)
CSN: [id number]     Arrival date & time 02/05/14  0615 History   First MD Initiated Contact with Patient 02/05/14 [phone]     Chief Complaint  Patient presents with  . Emesis During Pregnancy  . Abdominal Pain     (Consider location/radiation/quality/duration/timing/severity/associated sxs/prior Treatment) HPI  Patient is G3 P0 Ab2 (miscarriages at 8 and 6 weeks respectively). Patient is currently [redacted] weeks pregnant and her due date is April 28. She reports her husband came home earlier this morning and he had vomiting and diarrhea. About 5 AM she also started having nausea with vomiting about 4 times and diarrhea about 6 times described as loose. There was no blood in either. She denies any vaginal bleeding. She states she has tightness in her upper abdomen and she also has some heartburn. She denies eating anything in common with her husband yesterday.  [facility] in [place]  Past Medical History  Diagnosis Date  . Obesity   . PCOS (polycystic ovarian syndrome)     "When I was young". No medical intervention.  . Infected tooth   . Bipolar 1 disorder    Past Surgical History  Procedure Laterality Date  . Tonsillectomy    . Dilation and curettage of uterus N/A 09/03/2012    Procedure: SUCTION DILATATION AND CURETTAGE;  Surgeon: Lazaro Arms, MD;  Location: [facility];  Service: Gynecology;  Laterality: N/A;   Family History  Problem Relation Age of Onset  . Diabetes Paternal Grandmother   . Diabetes Paternal Grandfather   . Cancer Mother     cervicle  . Cancer Paternal Uncle     lung  . Cancer Maternal Grandmother     lung  . Cancer Maternal Grandfather     throat   History  Substance Use Topics  . Smoking status: Former Smoker -- 1.00 packs/day for 2 years    Types: Cigarettes    Quit date: 08/25/2012  . Smokeless tobacco: Never Used  . Alcohol Use: No     Comment: Rare; not now   unemployed  OB History    Gravida Para Term Preterm AB TAB SAB Ectopic  Multiple Living   0     Review of Systems  All other systems reviewed and are negative.     Allergies  Carbamazepine  Home Medications   Prior to Admission medications   Medication Sig Start Date End Date Taking? Authorizing Provider  ibuprofen (ADVIL,MOTRIN) 800 MG tablet Take 800 mg by mouth once.    Historical Provider, MD  Prenatal Vit-Fe Fumarate-FA (PRENATAL MULTIVITAMIN) TABS tablet Take 1 tablet by mouth daily at 12 noon.    Historical Provider, MD   BP 117/70 mmHg  Pulse 120  Temp(Src) 97.7 F (36.5 C) (Oral)  Resp 22  Ht  (1.676 m)  Wt 282 lb (127.914 kg)  BMI 45.54 kg/m2  SpO2 98%  LMP 06/29/2013  Vital signs normal except for tachycardia  Physical Exam  Constitutional: She is oriented to person, place, and time. She appears well-developed and well-nourished.  Non-toxic appearance. She does not appear ill. No distress.  HENT:  Head: Normocephalic and atraumatic.  Right Ear: External ear normal.  Left Ear: External ear normal.  Nose: Nose normal. No mucosal edema or rhinorrhea.  Mouth/Throat: Oropharynx is clear and moist and mucous membranes are normal. No dental abscesses or uvula swelling.  Eyes: Conjunctivae and EOM are normal. Pupils are equal, round,  and reactive to light.  Neck: Normal range of motion and full passive range of motion without pain. Neck supple.  Cardiovascular: Normal rate, regular rhythm and normal heart sounds.  Exam reveals no gallop and no friction rub.   No murmur heard. Pulmonary/Chest: Effort normal and breath sounds normal. No respiratory distress. She has no wheezes. She has no rhonchi. She has no rales. She exhibits no tenderness and no crepitus.  Abdominal: Soft. Normal appearance and bowel sounds are normal. She exhibits no distension. There is tenderness. There is no rebound and no guarding.    Area of pain noted Obese abdomen, nontender uterus.  FHR 155-160 on monitor  Musculoskeletal: Normal range of  motion. She exhibits no edema or tenderness.  Moves all extremities well.   Neurological: She is alert and oriented to person, place, and time. She has normal strength. No cranial nerve deficit.  Skin: Skin is warm, dry and intact. No rash noted. No erythema. No pallor.  Psychiatric: She has a normal mood and affect. Her speech is normal and behavior is normal. Her mood appears not anxious.  Nursing note and vitals reviewed.   ED Course  Procedures (including critical care time)  Medications  sodium chloride 0.9 % bolus 1,000 mL (1,000 mLs Intravenous New Bag/Given 02/05/14 0650)  ondansetron (ZOFRAN) injection 4 mg (4 mg Intravenous Given 02/05/14 0650)    Patient was given IV fluids and nausea medication. She was placed on the fetal monitor.  Patient will be turned over to Dr. June LeapZandman at change of shift.   Labs Review  I stat 8 pending    Imaging Review No results found.   EKG Interpretation None      MDM   Final diagnoses:  Pregnancy  Nausea vomiting and diarrhea    Disposition pending  Devoria AlbeIva Deron Poole, MD, Armando GangFACEP     Ward GivensIva L Wanona Stare, MD 02/05/14 [id number]

## 2014-02-05 NOTE — ED Notes (Signed)
Pt alert & oriented x4, stable gait. Patient given discharge instructions, paperwork & prescription(s). Patient  instructed to stop at the registration desk to finish any additional paperwork. Patient verbalized understanding. Pt left department w/ no further questions. 

## 2014-02-05 NOTE — ED Notes (Signed)
Doppler found fetal HR of 150.

## 2014-02-05 NOTE — ED Notes (Signed)
Pt c/o abdominal cramping and emesis since last night. Reports seeing "green chunky" discharge once in her underwear last night. Denies any bleeding or additional discharge. States she is [redacted] weeks pregnant and has been seeing her OB/GYN.

## 2014-02-05 NOTE — Progress Notes (Addendum)
[id number] Received call from Greg, RN @ [facility] ED about this [age] yo G3P0 @ 26.[redacted] wks GA in with complaint of nausea and vomiting.  Patient reported to ED staff that her husband had similar symptoms last week.  [id number]  [facility]  ED staff unable to obtain FHR tracing.  Instructions on placement of cardio for this gestation and tips for securing given.  Instructions of placing toco appropriately and zeroing.  Instructed to obtain and document FHR dopplers intermittently until able to trace FHR with cardio. [email or web address]  Spoke with [facility] ED RN, Tammy SoursGreg.  He states EDP has given order for intermittent doppler of FHR.  I gave recommendation to continue contraction monitor and obtain and document FHR dopplers Q915min until fetal well being established.  Also recommended placing SpO2 monitor on patient to differentiate FHR from maternal HR.

## 2014-02-05 NOTE — ED Notes (Signed)
Dr. Estell HarpinZammit at bedside to reassess the pt.

## 2014-09-07 ENCOUNTER — Encounter (HOSPITAL_COMMUNITY): Payer: Self-pay | Admitting: *Deleted

## 2015-01-09 ENCOUNTER — Emergency Department (HOSPITAL_COMMUNITY)
Admission: EM | Admit: 2015-01-09 | Discharge: 2015-01-09 | Disposition: A | Discharge status: Home / Self Care | Payer: Managed Care, Other (non HMO) | Attending: Emergency Medicine | Admitting: Emergency Medicine

## 2015-01-09 ENCOUNTER — Encounter (HOSPITAL_COMMUNITY): Payer: Self-pay | Admitting: Emergency Medicine

## 2015-01-09 DIAGNOSIS — Z8659 Personal history of other mental and behavioral disorders: Secondary | ICD-10-CM | POA: Insufficient documentation

## 2015-01-09 DIAGNOSIS — Z79899 Other long term (current) drug therapy: Secondary | ICD-10-CM | POA: Diagnosis not present

## 2015-01-09 DIAGNOSIS — E669 Obesity, unspecified: Secondary | ICD-10-CM | POA: Insufficient documentation

## 2015-01-09 DIAGNOSIS — R197 Diarrhea, unspecified: Secondary | ICD-10-CM | POA: Diagnosis not present

## 2015-01-09 DIAGNOSIS — R1010 Upper abdominal pain, unspecified: Secondary | ICD-10-CM | POA: Diagnosis present

## 2015-01-09 DIAGNOSIS — Z87891 Personal history of nicotine dependence: Secondary | ICD-10-CM | POA: Diagnosis not present

## 2015-01-09 DIAGNOSIS — R109 Unspecified abdominal pain: Secondary | ICD-10-CM | POA: Diagnosis not present

## 2015-01-09 DIAGNOSIS — R51 Headache: Secondary | ICD-10-CM | POA: Diagnosis not present

## 2015-01-09 DIAGNOSIS — R519 Headache, unspecified: Secondary | ICD-10-CM

## 2015-01-09 DIAGNOSIS — R112 Nausea with vomiting, unspecified: Secondary | ICD-10-CM | POA: Diagnosis not present

## 2015-01-09 LAB — URINALYSIS, ROUTINE W REFLEX MICROSCOPIC
GLUCOSE, UA: NEGATIVE mg/dL
Ketones, ur: 15 mg/dL — AB
Nitrite: NEGATIVE
PROTEIN: 30 mg/dL — AB
SPECIFIC GRAVITY, URINE: 1.03 (ref 1.005–1.030)
pH: 6 (ref 5.0–8.0)

## 2015-01-09 LAB — CBC WITH DIFFERENTIAL/PLATELET
Basophils Absolute: 0 10*3/uL (ref 0.0–0.1)
Basophils Relative: 0 %
Eosinophils Absolute: 0.2 10*3/uL (ref 0.0–0.7)
Eosinophils Relative: 1 %
HCT: 40.2 % (ref 36.0–46.0)
Hemoglobin: 13.1 g/dL (ref 12.0–15.0)
LYMPHS ABS: 2.8 10*3/uL (ref 0.7–4.0)
LYMPHS PCT: 17 %
MCH: 27.6 pg (ref 26.0–34.0)
MCHC: 32.6 g/dL (ref 30.0–36.0)
MCV: 84.6 fL (ref 78.0–100.0)
Monocytes Absolute: 1.2 10*3/uL — ABNORMAL HIGH (ref 0.1–1.0)
Monocytes Relative: 7 %
NEUTROS PCT: 75 %
Neutro Abs: 12.3 10*3/uL — ABNORMAL HIGH (ref 1.7–7.7)
Platelets: 456 10*3/uL — ABNORMAL HIGH (ref 150–400)
RBC: 4.75 MIL/uL (ref 3.87–5.11)
RDW: 14.9 % (ref 11.5–15.5)
WBC: 16.6 10*3/uL — ABNORMAL HIGH (ref 4.0–10.5)

## 2015-01-09 LAB — BASIC METABOLIC PANEL
Anion gap: 13 (ref 5–15)
BUN: 7 mg/dL (ref 6–20)
CHLORIDE: 105 mmol/L (ref 101–111)
CO2: 22 mmol/L (ref 22–32)
Calcium: 9.2 mg/dL (ref 8.9–10.3)
Creatinine, Ser: 0.9 mg/dL (ref 0.44–1.00)
GFR calc Af Amer: >60 mL/min (ref >60)
GFR calc non Af Amer: >60 mL/min (ref >60)
GLUCOSE: 117 mg/dL — AB (ref 65–99)
Potassium: 3.5 mmol/L (ref 3.5–5.1)
Sodium: 140 mmol/L (ref 135–145)

## 2015-01-09 LAB — URINE MICROSCOPIC-ADD ON

## 2015-01-09 LAB — POC URINE PREG, ED: Preg Test, Ur: NEGATIVE

## 2015-01-09 MED ORDER — SODIUM CHLORIDE 0.9 % IV SOLN
1000.0000 mL | INTRAVENOUS | Status: DC
  Administered 2015-01-09: 1000 mL via INTRAVENOUS

## 2015-01-09 MED ORDER — METOCLOPRAMIDE HCL 5 MG/ML IJ SOLN
10.0000 mg | Freq: Once | INTRAMUSCULAR | Status: AC
  Administered 2015-01-09: 10 mg via INTRAVENOUS
  Filled 2015-01-09: qty 2

## 2015-01-09 MED ORDER — SODIUM CHLORIDE 0.9 % IV SOLN
1000.0000 mL | Freq: Once | INTRAVENOUS | Status: AC
  Administered 2015-01-09: 1000 mL via INTRAVENOUS

## 2015-01-09 MED ORDER — OXYCODONE-ACETAMINOPHEN 5-325 MG PO TABS
1.0000 | ORAL_TABLET | Freq: Once | ORAL | Status: AC
Start: 2015-01-09 — End: 2015-01-09
  Administered 2015-01-09: 1 via ORAL

## 2015-01-09 MED ORDER — OXYCODONE-ACETAMINOPHEN 5-325 MG PO TABS
ORAL_TABLET | ORAL | Status: AC
  Filled 2015-01-09: qty 1

## 2015-01-09 MED ORDER — DIPHENHYDRAMINE HCL 50 MG/ML IJ SOLN
25.0000 mg | Freq: Once | INTRAMUSCULAR | Status: AC
  Administered 2015-01-09: 04:00:00 via INTRAVENOUS
  Filled 2015-01-09: qty 1

## 2015-01-09 MED ORDER — ONDANSETRON 4 MG PO TBDP
8.0000 mg | ORAL_TABLET | Freq: Once | ORAL | Status: AC
  Administered 2015-01-09: 8 mg via ORAL

## 2015-01-09 MED ORDER — LOPERAMIDE HCL 2 MG PO CAPS
4.0000 mg | ORAL_CAPSULE | Freq: Once | ORAL | Status: AC
  Administered 2015-01-09: 4 mg via ORAL
  Filled 2015-01-09: qty 2

## 2015-01-09 MED ORDER — ONDANSETRON 4 MG PO TBDP
ORAL_TABLET | ORAL | Status: AC
  Filled 2015-01-09: qty 2

## 2015-01-09 NOTE — ED Provider Notes (Signed)
CSN: [id number]     Arrival date & time 01/09/15  0137 History  By signing my name below, I, Evon Slack, attest that this documentation has been prepared under the direction and in the presence of Dione Booze, MD. Electronically Signed: Evon Slack, ED Scribe. 01/09/2015. 3:56 AM.     Chief Complaint  Patient presents with  . Headache  . Emesis   Patient is a 24 y.o. female presenting with headaches and vomiting. The history is provided by the patient. No language interpreter was used.  Headache Associated symptoms: abdominal pain, diarrhea, nausea and vomiting   Associated symptoms: no fever   Emesis Associated symptoms: abdominal pain, diarrhea and headaches    HPI Comments: Sara Carpenter is a 24 y.o. female who presents to the Emergency Department complaining of burning  upper abdominal pain onset 1 day prior. Pt state that she has had associated nausea, vomiting, diarrhea  and throbbing temporal HA. Pt states that bright light makes her HA worse. Pt states that since having Zofran and percocet her symptoms have improved. Pt denies fever or other related symptoms. Pt does report being around family members who recently had diarrhea.   Past Medical History  Diagnosis Date  . Obesity   . PCOS (polycystic ovarian syndrome)     "When I was young". No medical intervention.  . Infected tooth   . Bipolar 1 disorder [facility])    Past Surgical History  Procedure Laterality Date  . Tonsillectomy    . Dilation and curettage of uterus N/A 09/03/2012    Procedure: SUCTION DILATATION AND CURETTAGE;  Surgeon: Lazaro Arms, MD;  Location: [facility];  Service: Gynecology;  Laterality: N/A;   Family History  Problem Relation Age of Onset  . Diabetes Paternal Grandmother   . Diabetes Paternal Grandfather   . Cancer Mother     cervicle  . Cancer Paternal Uncle     lung  . Cancer Maternal Grandmother     lung  . Cancer Maternal Grandfather     throat   Social History  Substance  Use Topics  . Smoking status: Former Smoker -- 0.00 packs/day for 2 years    Types: Cigarettes    Quit date: 08/25/2012  . Smokeless tobacco: Never Used  . Alcohol Use: No     Comment: Rare; not now   OB History    Gravida Para Term Preterm AB TAB SAB Ectopic Multiple Living   0     Review of Systems  Constitutional: Negative for fever.  Gastrointestinal: Positive for nausea, vomiting, abdominal pain and diarrhea.  Neurological: Positive for headaches.  All other systems reviewed and are negative.     Allergies  Carbamazepine  Home Medications   Prior to Admission medications   Medication Sig Start Date End Date Taking? Authorizing Provider  ondansetron (ZOFRAN ODT) 4 MG disintegrating tablet  ODT q4 hours prn nausea/vomit 02/05/14   Bethann Berkshire, MD  Prenatal Vit-Fe Fumarate-FA (PRENATAL MULTIVITAMIN) TABS tablet Take 1 tablet by mouth daily at 12 noon.    Historical Provider, MD   BP 141/81 mmHg  Pulse 108  Temp(Src) 98.6 F (37 C) (Oral)  Resp 16  Wt 316 lb (143.337 kg)  SpO2 100%  LMP 01/02/2015 (Approximate)   Physical Exam  Constitutional: She is oriented to person, place, and time. She appears well-developed and well-nourished. No distress.  Obese.   HENT:  Head: Normocephalic and atraumatic.  Eyes: Conjunctivae  and EOM are normal. Pupils are equal, round, and reactive to light.  Neck: Normal range of motion. Neck supple. No JVD present.  Cardiovascular: Normal rate, regular rhythm and normal heart sounds.   No murmur heard. Pulmonary/Chest: Effort normal and breath sounds normal. She has no wheezes. She has no rales. She exhibits no tenderness.  Abdominal: Soft. Bowel sounds are normal. She exhibits no distension. There is no tenderness.  Bowel sounds decreased.   Musculoskeletal: Normal range of motion. She exhibits edema.  1+ pitting edema bilaterally.   Lymphadenopathy:    She has no cervical adenopathy.  Neurological: She is alert  and oriented to person, place, and time. No cranial nerve deficit. She exhibits normal muscle tone. Coordination normal.  Skin: Skin is warm and dry.  Psychiatric: She has a normal mood and affect. Her behavior is normal. Judgment and thought content normal.  Nursing note and vitals reviewed.   ED Course  Procedures (including critical care time) DIAGNOSTIC STUDIES: Oxygen Saturation is 100% on RA, normal  by my interpretation.    COORDINATION OF CARE: 4:01 AM-Discussed treatment plan with pt at bedside and pt agreed to plan.     Labs Review Results for orders placed or performed during the hospital encounter of 01/09/15  CBC with Differential  Result Value Ref Range   WBC 16.6 (H) 4.0 - 10.5 K/uL   RBC 4.75 3.87 - 5.11 MIL/uL   Hemoglobin 13.1 12.0 - 15.0 g/dL   HCT 13.2 44.0 - 10.2 %   MCV 84.6 78.0 - 100.0 fL   MCH 27.6 26.0 - 34.0 pg   MCHC 32.6 30.0 - 36.0 g/dL   RDW 72.5 36.6 - 44.0 %   Platelets 456 (H) 150 - 400 K/uL   Neutrophils Relative % 75 %   Neutro Abs 12.3 (H) 1.7 - 7.7 K/uL   Lymphocytes Relative 17 %   Lymphs Abs 2.8 0.7 - 4.0 K/uL   Monocytes Relative 7 %   Monocytes Absolute 1.2 (H) 0.1 - 1.0 K/uL   Eosinophils Relative 1 %   Eosinophils Absolute 0.2 0.0 - 0.7 K/uL   Basophils Relative 0 %   Basophils Absolute 0.0 0.0 - 0.1 K/uL  Basic metabolic panel  Result Value Ref Range   Sodium 140 135 - 145 mmol/L   Potassium 3.5 3.5 - 5.1 mmol/L   Chloride 105 101 - 111 mmol/L   CO2 22 22 - 32 mmol/L   Glucose, Bld 117 (H) 65 - 99 mg/dL   BUN 7 6 - 20 mg/dL   Creatinine, Ser 3.47 0.44 - 1.00 mg/dL   Calcium 9.2 8.9 - 42.5 mg/dL   GFR calc non Af Amer >60 >60 mL/min   GFR calc Af Amer >60 >60 mL/min   Anion gap 13 5 - 15  Urinalysis, Routine w reflex microscopic (not at [facility])  Result Value Ref Range   Color, Urine AMBER (A) YELLOW   APPearance TURBID (A) CLEAR   Specific Gravity, Urine 1.030 1.005 - 1.030   pH 6.0 5.0 - 8.0   Glucose, UA NEGATIVE  NEGATIVE mg/dL   Hgb urine dipstick LARGE (A) NEGATIVE   Bilirubin Urine SMALL (A) NEGATIVE   Ketones, ur 15 (A) NEGATIVE mg/dL   Protein, ur 30 (A) NEGATIVE mg/dL   Nitrite NEGATIVE NEGATIVE   Leukocytes, UA SMALL (A) NEGATIVE  Urine microscopic-add on  Result Value Ref Range   Squamous Epithelial / LPF TOO NUMEROUS TO COUNT (A) NONE SEEN   WBC,  UA 6-30 0 - 5 WBC/hpf   RBC / HPF TOO NUMEROUS TO COUNT 0 - 5 RBC/hpf   Bacteria, UA MANY (A) NONE SEEN   Urine-Other MUCOUS PRESENT   POC Urine Pregnancy, ED (do NOT order at [facility])  Result Value Ref Range   Preg Test, Ur NEGATIVE NEGATIVE    MDM   Final diagnoses:  Headache, unspecified headache type  Abdominal pain, unspecified abdominal location  Nausea and vomiting, vomiting of unspecified type  Diarrhea, unspecified type      Episode of vomiting and diarrhea which is mainly resolved and appears to be viral. Patient has now developed a headache which has characteristics of migraine. Screening labs were obtained and did show leukocytosis which is nonspecific. She was given a migraine cocktail of normal saline, metoclopramide, diphenhydramine and following this felt much better. She is discharged in good condition. Old records reviewed and I could find no relevant past visits.   I personally performed the services described in this documentation, which was scribed in my presence. The recorded information has been reviewed and is accurate.       Dione Boozeavid Koi Yarbro, MD 01/09/15 [phone]

## 2015-01-09 NOTE — ED Notes (Signed)
Pt. reports headache onset this evening and  emesis onset yesterday , denies fever or chills , no diarrhea .

## 2015-01-09 NOTE — Discharge Instructions (Signed)
General Headache Without Cause A headache is pain or discomfort felt around the head or neck area. The specific cause of a headache may not be found. There are many causes and types of headaches. A few common ones are:  Tension headaches.  Migraine headaches.  Cluster headaches.  Chronic daily headaches. HOME CARE INSTRUCTIONS  Watch your condition for any changes. Take these steps to help with your condition: Managing Pain  Take over-the-counter and prescription medicines only as told by your health care provider.  Lie down in a dark, quiet room when you have a headache.  If directed, apply ice to the head and neck area:  Put ice in a plastic bag.  Place a towel between your skin and the bag.  Leave the ice on for 20 minutes, 2-3 times per day.  Use a heating pad or hot shower to apply heat to the head and neck area as told by your health care provider.  Keep lights dim if bright lights bother you or make your headaches worse. Eating and Drinking  Eat meals on a regular schedule.  Limit alcohol use.  Decrease the amount of caffeine you drink, or stop drinking caffeine. General Instructions  Keep all follow-up visits as told by your health care provider. This is important.  Keep a headache journal to help find out what may trigger your headaches. For example, write down:  What you eat and drink.  How much sleep you get.  Any change to your diet or medicines.  Try massage or other relaxation techniques.  Limit stress.  Sit up straight, and do not tense your muscles.  Do not use tobacco products, including cigarettes, chewing tobacco, or e-cigarettes. If you need help quitting, ask your health care provider.  Exercise regularly as told by your health care provider.  Sleep on a regular schedule. Get 7-9 hours of sleep, or the amount recommended by your health care provider. SEEK MEDICAL CARE IF:   Your symptoms are not helped by medicine.  You have a  headache that is different from the usual headache.  You have nausea or you vomit.  You have a fever. SEEK IMMEDIATE MEDICAL CARE IF:   Your headache becomes severe.  You have repeated vomiting.  You have a stiff neck.  You have a loss of vision.  You have problems with speech.  You have pain in the eye or ear.  You have muscular weakness or loss of muscle control.  You lose your balance or have trouble walking.  You feel faint or pass out.  You have confusion.   This information is not intended to replace advice given to you by your health care provider. Make sure you discuss any questions you have with your health care provider.   Document Released: 01/01/2005 Document Revised: 09/22/2014 Document Reviewed: 04/26/2014 Elsevier Interactive Patient Education 2016 [employer] Inc[employer]   Abdominal Pain, Adult Many things can cause abdominal pain. Usually, abdominal pain is not caused by a disease and will improve without treatment. It can often be observed and treated at home. Your health care provider will do a physical exam and possibly order blood tests and X-rays to help determine the seriousness of your pain. However, in many cases, more time must pass before a clear cause of the pain can be found. Before that point, your health care provider may not know if you need more testing or further treatment. HOME CARE INSTRUCTIONS Monitor your abdominal pain for any changes. The following actions may  help to alleviate any discomfort you are experiencing:  Only take over-the-counter or prescription medicines as directed by your health care provider.  Do not take laxatives unless directed to do so by your health care provider.  Try a clear liquid diet (broth, tea, or water) as directed by your health care provider. Slowly move to a bland diet as tolerated. SEEK MEDICAL CARE IF:  You have unexplained abdominal pain.  You have abdominal pain associated with nausea or diarrhea.  You  have pain when you urinate or have a bowel movement.  You experience abdominal pain that wakes you in the night.  You have abdominal pain that is worsened or improved by eating food.  You have abdominal pain that is worsened with eating fatty foods.  You have a fever. SEEK IMMEDIATE MEDICAL CARE IF:  Your pain does not go away within 2 hours.  You keep throwing up (vomiting).  Your pain is felt only in portions of the abdomen, such as the right side or the left lower portion of the abdomen.  You pass bloody or black tarry stools. MAKE SURE YOU:  Understand these instructions.  Will watch your condition.  Will get help right away if you are not doing well or get worse.   This information is not intended to replace advice given to you by your health care provider. Make sure you discuss any questions you have with your health care provider.   Document Released: 10/11/2004 Document Revised: 09/22/2014 Document Reviewed: 09/10/2012 Elsevier Interactive Patient Education 2016 Else[employer] Inc.  Nausea and Vomiting Nausea is a sick feeling that often comes before throwing up (vomiting). Vomiting is a reflex where stomach contents come out of your mouth. Vomiting can cause severe loss of body fluids (dehydration). Children and elderly adults can become dehydrated quickly, especially if they also have diarrhea. Nausea and vomiting are symptoms of a condition or disease. It is important to find the cause of your symptoms. CAUSES   Direct irritation of the stomach lining. This irritation can result from increased acid production (gastroesophageal reflux disease), infection, food poisoning, taking certain medicines (such as nonsteroidal anti-inflammatory drugs), alcohol use, or tobacco use.  Signals from the brain.These signals could be caused by a headache, heat exposure, an inner ear disturbance, increased pressure in the brain from injury, infection, a tumor, or a concussion, pain,  emotional stimulus, or metabolic problems.  An obstruction in the gastrointestinal tract (bowel obstruction).  Illnesses such as diabetes, hepatitis, gallbladder problems, appendicitis, kidney problems, cancer, sepsis, atypical symptoms of a heart attack, or eating disorders.  Medical treatments such as chemotherapy and radiation.  Receiving medicine that makes you sleep (general anesthetic) during surgery. DIAGNOSIS Your caregiver may ask for tests to be done if the problems do not improve after a few days. Tests may also be done if symptoms are severe or if the reason for the nausea and vomiting is not clear. Tests may include:  Urine tests.  Blood tests.  Stool tests.  Cultures (to look for evidence of infection).  X-rays or other imaging studies. Test results can help your caregiver make decisions about treatment or the need for additional tests. TREATMENT You need to stay well hydrated. Drink frequently but in small amounts.You may wish to drink water, sports drinks, clear broth, or eat frozen ice pops or gelatin dessert to help stay hydrated.When you eat, eating slowly may help prevent nausea.There are also some antinausea medicines that may help prevent nausea. HOME CARE INSTRUCTIONS  Take all medicine as directed by your caregiver.  If you do not have an appetite, do not force yourself to eat. However, you must continue to drink fluids.  If you have an appetite, eat a normal diet unless your caregiver tells you differently.  Eat a variety of complex carbohydrates (rice, wheat, potatoes, bread), lean meats, yogurt, fruits, and vegetables.  Avoid high-fat foods because they are more difficult to digest.  Drink enough water and fluids to keep your urine clear or pale yellow.  If you are dehydrated, ask your caregiver for specific rehydration instructions. Signs of dehydration may include:  Severe thirst.  Dry lips and mouth.  Dizziness.  Dark  urine.  Decreasing urine frequency and amount.  Confusion.  Rapid breathing or pulse. SEEK IMMEDIATE MEDICAL CARE IF:   You have blood or brown flecks (like coffee grounds) in your vomit.  You have black or bloody stools.  You have a severe headache or stiff neck.  You are confused.  You have severe abdominal pain.  You have chest pain or trouble breathing.  You do not urinate at least once every 8 hours.  You develop cold or clammy skin.  You continue to vomit for longer than 24 to 48 hours.  You have a fever. MAKE SURE YOU:   Understand these instructions.  Will watch your condition.  Will get help right away if you are not doing well or get worse.   This information is not intended to replace advice given to you by your health care provider. Make sure you discuss any questions you have with your health care provider.   Document Released: 01/01/2005 Document Revised: 03/26/2011 Document Reviewed: 05/31/2010 Elsevier Interactive Patient Education 2016 Else[employer] Inc.  Diarrhea Diarrhea is frequent loose and watery bowel movements. It can cause you to feel weak and dehydrated. Dehydration can cause you to become tired and thirsty, have a dry mouth, and have decreased urination that often is dark yellow. Diarrhea is a sign of another problem, most often an infection that will not last long. In most cases, diarrhea typically lasts 2-3 days. However, it can last longer if it is a sign of something more serious. It is important to treat your diarrhea as directed by your caregiver to lessen or prevent future episodes of diarrhea. CAUSES  Some common causes include:  Gastrointestinal infections caused by viruses, bacteria, or parasites.  Food poisoning or food allergies.  Certain medicines, such as antibiotics, chemotherapy, and laxatives.  Artificial sweeteners and fructose.  Digestive disorders. HOME CARE INSTRUCTIONS  Ensure adequate fluid intake (hydration): Have  1 cup (8 oz) of fluid for each diarrhea episode. Avoid fluids that contain simple sugars or sports drinks, fruit juices, whole milk products, and sodas. Your urine should be clear or pale yellow if you are drinking enough fluids. Hydrate with an oral rehydration solution that you can purchase at pharmacies, retail stores, and online. You can prepare an oral rehydration solution at home by mixing the following ingredients together:   - tsp table salt.   tsp baking soda.   tsp salt substitute containing potassium chloride.  1  tablespoons sugar.  1 L (34 oz) of water.  Certain foods and beverages may increase the speed at which food moves through the gastrointestinal (GI) tract. These foods and beverages should be avoided and include:  Caffeinated and alcoholic beverages.  High-fiber foods, such as raw fruits and vegetables, nuts, seeds, and whole grain breads and cereals.  Foods and beverages sweetened  with sugar alcohols, such as xylitol, sorbitol, and mannitol.  Some foods may be well tolerated and may help thicken stool including:  Starchy foods, such as rice, toast, pasta, low-sugar cereal, oatmeal, grits, baked potatoes, crackers, and bagels.  Bananas.  Applesauce.  Add probiotic-rich foods to help increase healthy bacteria in the GI tract, such as yogurt and fermented milk products.  Wash your hands well after each diarrhea episode.  Only take over-the-counter or prescription medicines as directed by your caregiver.  Take a warm bath to relieve any burning or pain from frequent diarrhea episodes. SEEK IMMEDIATE MEDICAL CARE IF:   You are unable to keep fluids down.  You have persistent vomiting.  You have blood in your stool, or your stools are black and tarry.  You do not urinate in 6-8 hours, or there is only a small amount of very dark urine.  You have abdominal pain that increases or localizes.  You have weakness, dizziness, confusion, or  light-headedness.  You have a severe headache.  Your diarrhea gets worse or does not get better.  You have a fever or persistent symptoms for more than 2-3 days.  You have a fever and your symptoms suddenly get worse. MAKE SURE YOU:   Understand these instructions.  Will watch your condition.  Will get help right away if you are not doing well or get worse.   This information is not intended to replace advice given to you by your health care provider. Make sure you discuss any questions you have with your health care provider.   Document Released: 12/22/2001 Document Revised: 01/22/2014 Document Reviewed: 09/09/2011 Elsevier Interactive Patient Education [employer]

## 2015-03-04 ENCOUNTER — Emergency Department (HOSPITAL_COMMUNITY)
Admission: EM | Admit: 2015-03-04 | Discharge: 2015-03-04 | Disposition: A | Discharge status: Home / Self Care | Payer: Managed Care, Other (non HMO) | Attending: Emergency Medicine | Admitting: Emergency Medicine

## 2015-03-04 ENCOUNTER — Encounter (HOSPITAL_COMMUNITY): Payer: Self-pay | Admitting: Family Medicine

## 2015-03-04 ENCOUNTER — Emergency Department (HOSPITAL_COMMUNITY): Payer: Managed Care, Other (non HMO)

## 2015-03-04 DIAGNOSIS — H53149 Visual discomfort, unspecified: Secondary | ICD-10-CM | POA: Diagnosis not present

## 2015-03-04 DIAGNOSIS — Z79899 Other long term (current) drug therapy: Secondary | ICD-10-CM | POA: Insufficient documentation

## 2015-03-04 DIAGNOSIS — Z8719 Personal history of other diseases of the digestive system: Secondary | ICD-10-CM | POA: Diagnosis not present

## 2015-03-04 DIAGNOSIS — R51 Headache: Secondary | ICD-10-CM | POA: Diagnosis present

## 2015-03-04 DIAGNOSIS — Z87891 Personal history of nicotine dependence: Secondary | ICD-10-CM | POA: Diagnosis not present

## 2015-03-04 DIAGNOSIS — R519 Headache, unspecified: Secondary | ICD-10-CM

## 2015-03-04 DIAGNOSIS — Z8659 Personal history of other mental and behavioral disorders: Secondary | ICD-10-CM | POA: Diagnosis not present

## 2015-03-04 LAB — CBC WITH DIFFERENTIAL/PLATELET
Basophils Absolute: 0 10*3/uL (ref 0.0–0.1)
Basophils Relative: 0 %
EOS PCT: 1 %
Eosinophils Absolute: 0.1 10*3/uL (ref 0.0–0.7)
HCT: 40.9 % (ref 36.0–46.0)
Hemoglobin: 13.1 g/dL (ref 12.0–15.0)
LYMPHS ABS: 1.5 10*3/uL (ref 0.7–4.0)
Lymphocytes Relative: 12 %
MCH: 27.5 pg (ref 26.0–34.0)
MCHC: 32 g/dL (ref 30.0–36.0)
MCV: 85.7 fL (ref 78.0–100.0)
MONOS PCT: 4 %
Monocytes Absolute: 0.5 10*3/uL (ref 0.1–1.0)
Neutro Abs: 10.4 10*3/uL — ABNORMAL HIGH (ref 1.7–7.7)
Neutrophils Relative %: 83 %
Platelets: 414 10*3/uL — ABNORMAL HIGH (ref 150–400)
RBC: 4.77 MIL/uL (ref 3.87–5.11)
RDW: 14.1 % (ref 11.5–15.5)
WBC: 12.6 10*3/uL — AB (ref 4.0–10.5)

## 2015-03-04 LAB — BASIC METABOLIC PANEL
Anion gap: 11 (ref 5–15)
BUN: 7 mg/dL (ref 6–20)
CHLORIDE: 109 mmol/L (ref 101–111)
CO2: 22 mmol/L (ref 22–32)
Calcium: 9.6 mg/dL (ref 8.9–10.3)
Creatinine, Ser: 0.97 mg/dL (ref 0.44–1.00)
GFR calc Af Amer: >60 mL/min (ref >60)
GFR calc non Af Amer: >60 mL/min (ref >60)
Glucose, Bld: 99 mg/dL (ref 65–99)
POTASSIUM: 4.7 mmol/L (ref 3.5–5.1)
Sodium: 142 mmol/L (ref 135–145)

## 2015-03-04 LAB — I-STAT BETA HCG BLOOD, ED (MC, WL, AP ONLY)

## 2015-03-04 MED ORDER — KETOROLAC TROMETHAMINE 30 MG/ML IJ SOLN
30.0000 mg | Freq: Once | INTRAMUSCULAR | Status: AC
  Administered 2015-03-04: 30 mg via INTRAVENOUS
  Filled 2015-03-04: qty 1

## 2015-03-04 MED ORDER — LIDOCAINE HCL (PF) 1 % IJ SOLN
INTRAMUSCULAR | Status: AC
  Filled 2015-03-04: qty 10

## 2015-03-04 MED ORDER — LIDOCAINE HCL (PF) 1 % IJ SOLN
INTRAMUSCULAR | Status: DC
Start: 2015-03-04 — End: 2015-03-04
  Filled 2015-03-04: qty 5

## 2015-03-04 MED ORDER — METOCLOPRAMIDE HCL 5 MG/ML IJ SOLN
10.0000 mg | Freq: Once | INTRAMUSCULAR | Status: AC
  Administered 2015-03-04: 10 mg via INTRAVENOUS
  Filled 2015-03-04: qty 2

## 2015-03-04 MED ORDER — SODIUM CHLORIDE 0.9 % IV BOLUS (SEPSIS)
1000.0000 mL | Freq: Once | INTRAVENOUS | Status: AC
  Administered 2015-03-04: 1000 mL via INTRAVENOUS

## 2015-03-04 MED ORDER — DIPHENHYDRAMINE HCL 50 MG/ML IJ SOLN
25.0000 mg | Freq: Once | INTRAMUSCULAR | Status: AC
  Administered 2015-03-04: 25 mg via INTRAVENOUS
  Filled 2015-03-04: qty 1

## 2015-03-04 MED ORDER — DEXAMETHASONE SODIUM PHOSPHATE 10 MG/ML IJ SOLN
10.0000 mg | Freq: Once | INTRAMUSCULAR | Status: AC
  Administered 2015-03-04: 10 mg via INTRAVENOUS
  Filled 2015-03-04: qty 1

## 2015-03-04 NOTE — ED Notes (Signed)
Unsuccessful LP per EDP.

## 2015-03-04 NOTE — Discharge Instructions (Signed)

## 2015-03-04 NOTE — ED Notes (Signed)
EDP at the bedside to do LP

## 2015-03-04 NOTE — ED Notes (Signed)
Pt here for severe migraine, sts pressure behind her eyes. sts usually not as severe.

## 2015-03-04 NOTE — ED Provider Notes (Signed)
CSN: [id number]     Arrival date & time 03/04/15  4098 History   First MD Initiated Contact with Patient 03/04/15 1207     Chief Complaint  Patient presents with  . Migraine     (Consider location/radiation/quality/duration/timing/severity/associated sxs/prior Treatment) Patient is a 25 y.o. female presenting with headaches. The history is provided by the patient.  Headache Location: "behind by eyes" Quality:  Dull Radiates to:  Does not radiate Onset quality:  Gradual Duration:  2 days Timing:  Constant Progression:  Unchanged Chronicity:  Recurrent (has headaches frequently that can last for days) Similar to prior headaches: yes   Context: bright light and loud noise   Relieved by:  Nothing Worsened by:  Nothing Ineffective treatments:  Acetaminophen and NSAIDs (goody's powder (patient advised to stop taking)) Associated symptoms: photophobia   Associated symptoms: no fever, no focal weakness and no vomiting     Past Medical History  Diagnosis Date  . Obesity   . PCOS (polycystic ovarian syndrome)     "When I was young". No medical intervention.  . Infected tooth   . Bipolar 1 disorder [facility])    Past Surgical History  Procedure Laterality Date  . Tonsillectomy    . Dilation and curettage of uterus N/A 09/03/2012    Procedure: SUCTION DILATATION AND CURETTAGE;  Surgeon: Lazaro Arms, MD;  Location: [facility];  Service: Gynecology;  Laterality: N/A;   Family History  Problem Relation Age of Onset  . Diabetes Paternal Grandmother   . Diabetes Paternal Grandfather   . Cancer Mother     cervicle  . Cancer Paternal Uncle     lung  . Cancer Maternal Grandmother     lung  . Cancer Maternal Grandfather     throat   Social History  Substance Use Topics  . Smoking status: Former Smoker -- 0.00 packs/day for 2 years    Types: Cigarettes    Quit date: 08/25/2012  . Smokeless tobacco: Never Used  . Alcohol Use: No     Comment: Rare; not now   OB History    Gravida  Para Term Preterm AB TAB SAB Ectopic Multiple Living   0     Review of Systems  Constitutional: Negative for fever.  Eyes: Positive for photophobia.  Gastrointestinal: Negative for vomiting.  Neurological: Positive for headaches. Negative for focal weakness.  All other systems reviewed and are negative.     Allergies  Carbamazepine  Home Medications   Prior to Admission medications   Medication Sig Start Date End Date Taking? Authorizing Provider  ondansetron (ZOFRAN ODT) 4 MG disintegrating tablet  ODT q4 hours prn nausea/vomit 02/05/14   Bethann Berkshire, MD  Prenatal Vit-Fe Fumarate-FA (PRENATAL MULTIVITAMIN) TABS tablet Take 1 tablet by mouth daily at 12 noon.    Historical Provider, MD   BP 135/61 mmHg  Pulse 79  Temp(Src) 97.4 F (36.3 C) (Oral)  Resp 16  SpO2 100%  LMP 03/03/2015 Physical Exam  Constitutional: She is oriented to person, place, and time. She appears well-developed and well-nourished. No distress.  Morbidly obese  HENT:  Head: Normocephalic.  Eyes: Conjunctivae are normal.  Neck: Neck supple. No tracheal deviation present.  Cardiovascular: Normal rate and regular rhythm.   Pulmonary/Chest: Effort normal. No respiratory distress.  Abdominal: Soft. She exhibits no distension.  Neurological: She is alert and oriented to person, place, and time. She has normal strength. No cranial nerve deficit or sensory  deficit. Coordination and gait normal. GCS eye subscore is 4. GCS verbal subscore is 5. GCS motor subscore is 6.  Normal finger to nose testing  Skin: Skin is warm and dry.  Psychiatric: She has a normal mood and affect.    ED Course  .Lumbar Puncture Date/Time: 03/04/2015 2:00 PM Performed by: Lyndal Pulley Authorized by: Lyndal Pulley Consent: Verbal consent obtained. Risks and benefits: risks, benefits and alternatives were discussed Consent given by: patient Patient understanding: patient states understanding of the procedure  being performed Required items: required blood products, implants, devices, and special equipment available Patient identity confirmed: verbally with patient, arm band, provided demographic data and hospital-assigned identification number Indications: evaluation for IIH. Anesthesia: local infiltration Local anesthetic: lidocaine 1% without epinephrine Anesthetic total: 15 ml Patient sedated: no Preparation: Patient was prepped and draped in the usual sterile fashion. Lumbar space: L3-L4 interspace Patient's position: right lateral decubitus Needle gauge: 22 Needle type: spinal needle - Quincke tip Needle length: 3.5 in Number of attempts: 3 Post-procedure: site cleaned and adhesive bandage applied Patient tolerance: Patient tolerated the procedure well with no immediate complications Comments: Unable to obtain, able to reach bone with 3.5 cm needle but unable to advance to canal 2/2 extensive soft tissue   (including critical care time) Labs Review Labs Reviewed  CBC WITH DIFFERENTIAL/PLATELET - Abnormal; Notable for the following:    WBC 12.6 (*)    Platelets 414 (*)    Neutro Abs 10.4 (*)    All other components within normal limits  CSF CULTURE  GRAM STAIN  BASIC METABOLIC PANEL  I-STAT BETA HCG BLOOD, ED ([facility], [facility], [facility] ONLY)    Imaging Review Ct Head Wo Contrast  03/04/2015  CLINICAL DATA:  Chronic headache.  Pain behind the eyes. EXAM: CT HEAD WITHOUT CONTRAST TECHNIQUE: Contiguous axial images were obtained from the base of the skull through the vertex without contrast. COMPARISON:  None FINDINGS: No evidence for acute hemorrhage, mass lesion, midline shift, hydrocephalus or large infarct. Probable polyps within the right sphenoid sinus. There is mucosal thickening in the left sphenoid sinus. IMPRESSION: No acute intracranial abnormality. Sphenoid sinus disease. Electronically Signed   By: Richarda Overlie M.D.   On: 03/04/2015 13:17   I have personally reviewed and evaluated these  images and lab results as part of my medical decision-making.   EKG Interpretation None      MDM   Final diagnoses:  Frequent headaches    25 y.o. female presents with recurrent headaches that she gets often mostly behind her eyes. Typical of a migraine but Pt with morbid obesity and is at risk for IIH given historical features and habitus. Head CT without intracranial mass, offered patient evaluation for opening pressure and possible therapeutic LP for IIH. Pt consented to procedure but d/t body habitus LP was very difficult and unable to get to canal. Pt offered IR LP but states her headache is adequately controlled with migraine cocktail. Pt requesting discharge and given f/u information for recurrent headaches for further workup as an OP with neurology. Plan to follow up with PCP as needed and return precautions discussed for worsening or new concerning symptoms.     Lyndal Pulley, MD 03/05/15 [phone]

## 2015-05-30 ENCOUNTER — Emergency Department (HOSPITAL_COMMUNITY)
Admission: EM | Admit: 2015-05-30 | Discharge: 2015-05-30 | Disposition: A | Discharge status: Home / Self Care | Payer: Managed Care, Other (non HMO) | Attending: Emergency Medicine | Admitting: Emergency Medicine

## 2015-05-30 ENCOUNTER — Encounter (HOSPITAL_COMMUNITY): Payer: Self-pay | Admitting: Emergency Medicine

## 2015-05-30 DIAGNOSIS — Z8719 Personal history of other diseases of the digestive system: Secondary | ICD-10-CM | POA: Insufficient documentation

## 2015-05-30 DIAGNOSIS — R112 Nausea with vomiting, unspecified: Secondary | ICD-10-CM | POA: Insufficient documentation

## 2015-05-30 DIAGNOSIS — Z3202 Encounter for pregnancy test, result negative: Secondary | ICD-10-CM | POA: Insufficient documentation

## 2015-05-30 DIAGNOSIS — R1084 Generalized abdominal pain: Secondary | ICD-10-CM | POA: Diagnosis not present

## 2015-05-30 DIAGNOSIS — E669 Obesity, unspecified: Secondary | ICD-10-CM | POA: Diagnosis not present

## 2015-05-30 DIAGNOSIS — Z8659 Personal history of other mental and behavioral disorders: Secondary | ICD-10-CM | POA: Diagnosis not present

## 2015-05-30 DIAGNOSIS — R197 Diarrhea, unspecified: Secondary | ICD-10-CM | POA: Diagnosis not present

## 2015-05-30 DIAGNOSIS — Z87891 Personal history of nicotine dependence: Secondary | ICD-10-CM | POA: Diagnosis not present

## 2015-05-30 LAB — CBC
HEMATOCRIT: 38.2 % (ref 36.0–46.0)
Hemoglobin: 12.1 g/dL (ref 12.0–15.0)
MCH: 26.7 pg (ref 26.0–34.0)
MCHC: 31.7 g/dL (ref 30.0–36.0)
MCV: 84.3 fL (ref 78.0–100.0)
Platelets: 303 10*3/uL (ref 150–400)
RBC: 4.53 MIL/uL (ref 3.87–5.11)
RDW: 14.9 % (ref 11.5–15.5)
WBC: 8.7 10*3/uL (ref 4.0–10.5)

## 2015-05-30 LAB — COMPREHENSIVE METABOLIC PANEL
ALBUMIN: 2.9 g/dL — AB (ref 3.5–5.0)
ALT: 26 U/L (ref 14–54)
AST: 33 U/L (ref 15–41)
Alkaline Phosphatase: 53 U/L (ref 38–126)
Anion gap: 8 (ref 5–15)
CHLORIDE: 106 mmol/L (ref 101–111)
CO2: 25 mmol/L (ref 22–32)
CREATININE: 0.84 mg/dL (ref 0.44–1.00)
Calcium: 9.1 mg/dL (ref 8.9–10.3)
GFR calc Af Amer: >60 mL/min (ref >60)
GFR calc non Af Amer: >60 mL/min (ref >60)
GLUCOSE: 97 mg/dL (ref 65–99)
POTASSIUM: 4.6 mmol/L (ref 3.5–5.1)
Sodium: 139 mmol/L (ref 135–145)
Total Bilirubin: 0.4 mg/dL (ref 0.3–1.2)
Total Protein: 6.1 g/dL — ABNORMAL LOW (ref 6.5–8.1)

## 2015-05-30 LAB — URINALYSIS, ROUTINE W REFLEX MICROSCOPIC
BILIRUBIN URINE: NEGATIVE
Glucose, UA: NEGATIVE mg/dL
HGB URINE DIPSTICK: NEGATIVE
KETONES UR: NEGATIVE mg/dL
Leukocytes, UA: NEGATIVE
NITRITE: NEGATIVE
Protein, ur: NEGATIVE mg/dL
SPECIFIC GRAVITY, URINE: 1.024 (ref 1.005–1.030)
pH: 7 (ref 5.0–8.0)

## 2015-05-30 LAB — LIPASE, BLOOD: LIPASE: 31 U/L (ref 11–51)

## 2015-05-30 LAB — I-STAT BETA HCG BLOOD, ED (MC, WL, AP ONLY)

## 2015-05-30 MED ORDER — SODIUM CHLORIDE 0.9 % IV BOLUS (SEPSIS)
1000.0000 mL | Freq: Once | INTRAVENOUS | Status: AC
  Administered 2015-05-30: 1000 mL via INTRAVENOUS

## 2015-05-30 MED ORDER — FAMOTIDINE 20 MG PO TABS: 20.0000 mg | ORAL_TABLET | Freq: Two times a day (BID) | ORAL | Status: DC

## 2015-05-30 MED ORDER — GI COCKTAIL ~~LOC~~
30.0000 mL | Freq: Once | ORAL | Status: AC
  Administered 2015-05-30: 30 mL via ORAL
  Filled 2015-05-30: qty 30

## 2015-05-30 MED ORDER — ONDANSETRON HCL 4 MG PO TABS: 4.0000 mg | ORAL_TABLET | Freq: Four times a day (QID) | ORAL | Status: DC

## 2015-05-30 MED ORDER — ONDANSETRON HCL 4 MG/2ML IJ SOLN
4.0000 mg | Freq: Once | INTRAMUSCULAR | Status: AC
  Administered 2015-05-30: 4 mg via INTRAVENOUS
  Filled 2015-05-30: qty 2

## 2015-05-30 NOTE — ED Notes (Signed)
Pt sts N/V/D and abd cramping yesterday and some rash to legs

## 2015-05-30 NOTE — ED Provider Notes (Signed)
CSN: [id number]     Arrival date & time 05/30/15  1057 History   First MD Initiated Contact with Patient 05/30/15 1136     Chief Complaint  Patient presents with  . Emesis  . Diarrhea     HPI Comments: 25 year old female presents with nausea, vomiting, diarrhea for the past 2 days. Reports associated abdominal cramping which is generalized and intermittent. Patient states she has not been eating that much because she gets nauseous and has increased abdominal cramping after a meal. Emesis and diarrhea is nonbloody. Denies fever, chills, chest pain, shortness of breath, dysuria, blood in her stool, blood in urine, vaginal discharge, vaginal bleeding. Last menstrual period was 2 weeks ago. Denies previous abdominal surgeries  Patient is a 25 y.o. female presenting with vomiting and diarrhea.  Emesis Associated symptoms: abdominal pain and diarrhea   Associated symptoms: no chills   Diarrhea Associated symptoms: abdominal pain and vomiting   Associated symptoms: no chills, no diaphoresis and no fever     Past Medical History  Diagnosis Date  . Obesity   . PCOS (polycystic ovarian syndrome)     "When I was young". No medical intervention.  . Infected tooth   . Bipolar 1 disorder [facility])    Past Surgical History  Procedure Laterality Date  . Tonsillectomy    . Dilation and curettage of uterus N/A 09/03/2012    Procedure: SUCTION DILATATION AND CURETTAGE;  Surgeon: Lazaro Arms, MD;  Location: [facility];  Service: Gynecology;  Laterality: N/A;   Family History  Problem Relation Age of Onset  . Diabetes Paternal Grandmother   . Diabetes Paternal Grandfather   . Cancer Mother     cervicle  . Cancer Paternal Uncle     lung  . Cancer Maternal Grandmother     lung  . Cancer Maternal Grandfather     throat   Social History  Substance Use Topics  . Smoking status: Former Smoker -- 0.00 packs/day for 2 years    Types: Cigarettes    Quit date: 08/25/2012  . Smokeless tobacco: Never  Used  . Alcohol Use: No     Comment: Rare; not now   OB History    Gravida Para Term Preterm AB TAB SAB Ectopic Multiple Living   0     Review of Systems  Constitutional: Negative for fever, chills and diaphoresis.  Respiratory: Negative for shortness of breath.   Cardiovascular: Negative for chest pain.  Gastrointestinal: Positive for nausea, vomiting, abdominal pain and diarrhea. Negative for constipation and blood in stool.  Genitourinary: Negative for dysuria, frequency, flank pain, vaginal bleeding, vaginal discharge and pelvic pain.  All other systems reviewed and are negative.   Allergies  Carbamazepine  Home Medications   Prior to Admission medications   Medication Sig Start Date End Date Taking? Authorizing Provider  ondansetron (ZOFRAN ODT) 4 MG disintegrating tablet  ODT q4 hours prn nausea/vomit 02/05/14   Bethann Berkshire, MD   BP 109/81 mmHg  Pulse 87  Temp(Src) 97.8 F (36.6 C) (Oral)  Resp 18  SpO2 97%   Physical Exam  Constitutional: She is oriented to person, place, and time. She appears well-developed and well-nourished. No distress.  HENT:  Head: Normocephalic and atraumatic.  Eyes: Conjunctivae are normal. Pupils are equal, round, and reactive to light. Right eye exhibits no discharge. Left eye exhibits no discharge. No scleral icterus.  Neck: Normal range of motion.  Cardiovascular: Normal rate  and regular rhythm.  Exam reveals no gallop and no friction rub.   No murmur heard. Pulmonary/Chest: Effort normal and breath sounds normal. No respiratory distress. She has no wheezes. She has no rales. She exhibits no tenderness.  Abdominal: Soft. Bowel sounds are normal. She exhibits no distension and no mass. There is no tenderness. There is no rebound and no guarding.  Generalized tenderness however most tender over epigastric area  Neurological: She is alert and oriented to person, place, and time.  Skin: Skin is warm and dry.  Psychiatric:  She has a normal mood and affect.    ED Course  Procedures (including critical care time) Labs Review Labs Reviewed  COMPREHENSIVE METABOLIC PANEL - Abnormal; Notable for the following:    BUN <5 (*)    Total Protein 6.1 (*)    Albumin 2.9 (*)    All other components within normal limits  URINALYSIS, ROUTINE W REFLEX MICROSCOPIC (NOT AT [facility]) - Abnormal; Notable for the following:    Color, Urine AMBER (*)    APPearance HAZY (*)    All other components within normal limits  LIPASE, BLOOD  CBC  I-STAT BETA HCG BLOOD, ED ([facility], [facility], [facility] ONLY)    Imaging Review No results found. I have personally reviewed and evaluated these images and lab results as part of my medical decision-making.   EKG Interpretation None      MDM   Final diagnoses:  Nausea vomiting and diarrhea   25 year old female presents with nausea, vomiting, diarrhea for the past 2 days. She is afebrile, not tachycardic, normotensive. Physical exam reveals soft mildly tender abdomen which is generalized. Minimal epigastric tenderness. IV fluids, Zofran, GI cocktail given with significant improvement. CBC, CMP, lipase, pregnancy test, UA are all normal. No indication for further imaging at this time. Patient is NAD, non-toxic, with stable VS. Patient is informed of clinical course, understands medical decision making process, and agrees with plan. Opportunity for questions provided and all questions answered. return precautions given.   Bethel BornKelly Marie Syeda Prickett, PA-C 05/30/15 [phone]  Margarita Grizzleanielle Ray, MD 05/31/15 [phone]

## 2015-08-09 ENCOUNTER — Emergency Department (HOSPITAL_COMMUNITY)
Admission: EM | Admit: 2015-08-09 | Discharge: 2015-08-10 | Disposition: A | Discharge status: Home / Self Care | Payer: Managed Care, Other (non HMO) | Attending: Emergency Medicine | Admitting: Emergency Medicine

## 2015-08-09 ENCOUNTER — Emergency Department (HOSPITAL_COMMUNITY): Payer: Managed Care, Other (non HMO)

## 2015-08-09 ENCOUNTER — Encounter (HOSPITAL_COMMUNITY): Payer: Self-pay

## 2015-08-09 DIAGNOSIS — F319 Bipolar disorder, unspecified: Secondary | ICD-10-CM | POA: Diagnosis not present

## 2015-08-09 DIAGNOSIS — R079 Chest pain, unspecified: Secondary | ICD-10-CM | POA: Insufficient documentation

## 2015-08-09 DIAGNOSIS — Z79899 Other long term (current) drug therapy: Secondary | ICD-10-CM | POA: Diagnosis not present

## 2015-08-09 DIAGNOSIS — R110 Nausea: Secondary | ICD-10-CM | POA: Insufficient documentation

## 2015-08-09 DIAGNOSIS — R05 Cough: Secondary | ICD-10-CM | POA: Diagnosis present

## 2015-08-09 DIAGNOSIS — J029 Acute pharyngitis, unspecified: Secondary | ICD-10-CM | POA: Diagnosis not present

## 2015-08-09 DIAGNOSIS — Z87891 Personal history of nicotine dependence: Secondary | ICD-10-CM | POA: Insufficient documentation

## 2015-08-09 DIAGNOSIS — R059 Cough, unspecified: Secondary | ICD-10-CM

## 2015-08-09 MED ORDER — DEXAMETHASONE 4 MG PO TABS
10.0000 mg | ORAL_TABLET | Freq: Once | ORAL | Status: AC
  Administered 2015-08-09: 10 mg via ORAL
  Filled 2015-08-09: qty 3

## 2015-08-09 MED ORDER — LORAZEPAM 1 MG PO TABS
1.0000 mg | ORAL_TABLET | Freq: Once | ORAL | Status: AC
  Administered 2015-08-09: 1 mg via ORAL
  Filled 2015-08-09: qty 1

## 2015-08-09 MED ORDER — ALBUTEROL SULFATE HFA 108 (90 BASE) MCG/ACT IN AERS
2.0000 | INHALATION_SPRAY | Freq: Once | RESPIRATORY_TRACT | Status: AC
  Administered 2015-08-09: 2 via RESPIRATORY_TRACT
  Filled 2015-08-09: qty 6.7

## 2015-08-09 NOTE — ED Provider Notes (Signed)
[facility]-EMER[facility]PT Provider Note   CSN: [id number] Arrival date & time: 08/09/15  2137  First Provider Contact:  None    By signing my name below, I, Majel Homer, attest that this documentation has been prepared under the direction and in the presence of Zadie Rhine, MD . Electronically Signed: Majel Homer, Scribe. 08/09/2015. 10:42 PM.  History   Chief Complaint Chief Complaint  Patient presents with  . Cough    HPI Comments: Sara Carpenter is a 25 y.o. female who presents to the Emergency Department complaining of gradually worsening, dry cough and shortness of breath that began 2 months ago but worsened tonight. Pt reports chest pain secondary to coughing. She also notes associated sore throat that began 4 days ago, nausea and difficulty swallowing. She denies fever, vomiting, abdominal pain, hematemesis and swelling in legs. She also denies hx of asthma, COPD, MI or blood clots in her legs. Pt reports she smokes weed occasionally but denies smoking cigarettes.   The history is provided by the patient. No language interpreter was used.  Cough  This is a new problem. The current episode started more than 1 week ago. The problem has been gradually worsening. The cough is non-productive. There has been no fever. Associated symptoms include chest pain (secondary to cough), sore throat and shortness of breath. She has tried nothing for the symptoms. She is not a smoker. Her past medical history does not include COPD or asthma.    Past Medical History:  Diagnosis Date  . Bipolar 1 disorder (HCC)   . Infected tooth   . Obesity   . PCOS (polycystic ovarian syndrome)    "When I was young". No medical intervention.    Patient Active Problem List   Diagnosis Date Noted  . Morbid obesity (HCC)   . Encounter for routine screening for malformation using ultrasonics   . [redacted] weeks gestation of pregnancy   . Abscess of breast 12/10/2012  . Sebaceous cyst 12/10/2012    Past Surgical  History:  Procedure Laterality Date  . DILATION AND CURETTAGE OF UTERUS N/A 09/03/2012   Procedure: SUCTION DILATATION AND CURETTAGE;  Surgeon: Lazaro Arms, MD;  Location: [facility];  Service: Gynecology;  Laterality: N/A;  . TONSILLECTOMY      OB History    Gravida Para Term Preterm AB Living   3       2 0   SAB TAB Ectopic Multiple Live Births   2               Home Medications    Prior to Admission medications   Medication Sig Start Date End Date Taking? Authorizing Provider  famotidine (PEPCID) 20 MG tablet Take 1 tablet (20 mg total) by mouth 2 (two) times daily. 05/30/15   Bethel Born, PA-C  ondansetron (ZOFRAN ODT) 4 MG disintegrating tablet 4mg  ODT q4 hours prn nausea/vomit 02/05/14   Bethann Berkshire, MD  ondansetron (ZOFRAN) 4 MG tablet Take 1 tablet (4 mg total) by mouth every 6 (six) hours. 05/30/15   Bethel Born, PA-C    Family History Family History  Problem Relation Age of Onset  . Diabetes Paternal Grandmother   . Diabetes Paternal Grandfather   . Cancer Mother     cervicle  . Cancer Paternal Uncle     lung  . Cancer Maternal Grandmother     lung  . Cancer Maternal Grandfather     throat    Social History Social History  Substance  Use Topics  . Smoking status: Former Smoker    Packs/day: 0.00    Years: 2.00    Types: Cigarettes    Quit date: 08/25/2012  . Smokeless tobacco: Never Used  . Alcohol use No     Comment: Rare; not now     Allergies   Carbamazepine   Review of Systems Review of Systems  Constitutional: Negative for fever.  HENT: Positive for sore throat and trouble swallowing.   Respiratory: Positive for cough and shortness of breath.   Cardiovascular: Positive for chest pain (secondary to cough). Negative for leg swelling.  Gastrointestinal: Positive for nausea. Negative for abdominal pain and vomiting.  All other systems reviewed and are negative.  Physical Exam Updated Vital Signs BP (!) 141/114 (BP Location: Left  Arm)   Pulse 111   Temp 97.9 F (36.6 C) (Oral)   Resp (!) 28   Ht 5\' 6"  (1.676 m)   Wt 280 lb (127 kg)   LMP 07/16/2015   SpO2 95%   BMI 45.19 kg/m   Physical Exam CONSTITUTIONAL: Well developed/well nourished HEAD: Normocephalic/atraumatic EYES: EOMI/PERRL ENMT: Mucous membranes moist, uvula midline, no exudate, mild erythema noted  NECK: supple no meningeal signs SPINE/BACK:entire spine nontender CV: S1/S2 noted, no murmurs/rubs/gallops noted LUNGS: mild wheezing bilaterally  ABDOMEN: soft, nontender, no rebound or guarding, bowel sounds noted throughout abdomen GU:no cva tenderness NEURO: Pt is awake/alert/appropriate, moves all extremities x4.  No facial droop.   EXTREMITIES: pulses normal/equal, full ROM, no calf tenderness or edema  SKIN: warm, color normal PSYCH: anxious   ED Treatments / Results  Labs (all labs ordered are listed, but only abnormal results are displayed) Labs Reviewed - No data to display  EKG  EKG Interpretation  Date/Time:  Tuesday August 09 2015 22:39:40 EDT Ventricular Rate:  98 PR Interval:    QRS Duration: 90 QT Interval:  337 QTC Calculation: 431 R Axis:   69 Text Interpretation:  Sinus rhythm Borderline short PR interval Baseline wander in lead(s) III aVL aVF V2 No previous ECGs available Confirmed by Bebe Shaggy  MD, Elke Holtry ([id number]) on 08/09/2015 10:49:11 PM      Radiology Dg Chest 2 View  Result Date: 08/09/2015 CLINICAL DATA:  25 year old female with productive cough. EXAM: CHEST  2 VIEW COMPARISON:  None. FINDINGS: The heart size and mediastinal contours are within normal limits. Both lungs are clear. The visualized skeletal structures are unremarkable. IMPRESSION: No active cardiopulmonary disease. Electronically Signed   By: Elgie Collard M.D.   On: 08/09/2015 23:25   Procedures Procedures  DIAGNOSTIC STUDIES:  Oxygen Saturation is 98% on RA, normal by my interpretation.    COORDINATION OF CARE:  10:44 PM Discussed  treatment plan, which includes CXR and albuterol with pt at bedside and pt agreed to plan.   Medications Ordered in ED Medications  LORazepam (ATIVAN) tablet 1 mg (1 mg Oral Given 08/09/15 2256)  albuterol (PROVENTIL HFA;VENTOLIN HFA) 108 (90 Base) MCG/ACT inhaler 2 puff (2 puffs Inhalation Given 08/09/15 2300)   Initial Impression / Assessment and Plan / ED Course  I have reviewed the triage vital signs and the nursing notes.  Pertinent labs & imaging results that were available during my care of the patient were reviewed by me and considered in my medical decision making (see chart for details).  Clinical Course    Pt here with cough for 2 months that is worsening She had mild wheeze and CXR negative She also appears anxious which I feel is  playing a role in tachypnea Vitals improved BP 128/72   Pulse 99   Temp 97.9 F (36.6 C) (Oral)   Resp 18   Ht  (1.676 m)   Wt 127 kg   LMP 07/16/2015   SpO2 96%   BMI 45.19 kg/m  She also reports sore throat but only has minimal erythema to oropharynx, no stridor/drooling, neck is supple She reports CP only with cough I doubt PE at this time Will d/c home   Final Clinical Impressions(s) / ED Diagnoses   Final diagnoses:  Cough  Pharyngitis    New Prescriptions New Prescriptions   No medications on file   I personally performed the services described in this documentation, which was scribed in my presence. The recorded information has been reviewed and is accurate.     Zadie Rhine, MD 08/09/15 [phone]

## 2015-08-09 NOTE — ED Triage Notes (Signed)
Patient c/o dry cough X2 months, tonight it got worse. C/o sore throat as well.

## 2015-08-20 ENCOUNTER — Emergency Department (HOSPITAL_COMMUNITY)
Admission: EM | Admit: 2015-08-20 | Discharge: 2015-08-21 | Disposition: A | Discharge status: Home / Self Care | Payer: Managed Care, Other (non HMO) | Attending: Emergency Medicine | Admitting: Emergency Medicine

## 2015-08-20 ENCOUNTER — Emergency Department (HOSPITAL_COMMUNITY): Payer: Managed Care, Other (non HMO)

## 2015-08-20 ENCOUNTER — Encounter (HOSPITAL_COMMUNITY): Payer: Self-pay

## 2015-08-20 DIAGNOSIS — Z79899 Other long term (current) drug therapy: Secondary | ICD-10-CM | POA: Insufficient documentation

## 2015-08-20 DIAGNOSIS — J40 Bronchitis, not specified as acute or chronic: Secondary | ICD-10-CM | POA: Diagnosis not present

## 2015-08-20 DIAGNOSIS — Z87891 Personal history of nicotine dependence: Secondary | ICD-10-CM | POA: Diagnosis not present

## 2015-08-20 DIAGNOSIS — J029 Acute pharyngitis, unspecified: Secondary | ICD-10-CM | POA: Diagnosis present

## 2015-08-20 LAB — BASIC METABOLIC PANEL
ANION GAP: 7 (ref 5–15)
BUN: 10 mg/dL (ref 6–20)
CALCIUM: 8.6 mg/dL — AB (ref 8.9–10.3)
CO2: 24 mmol/L (ref 22–32)
Chloride: 105 mmol/L (ref 101–111)
Creatinine, Ser: 1 mg/dL (ref 0.44–1.00)
GFR calc Af Amer: >60 mL/min (ref >60)
GLUCOSE: 102 mg/dL — AB (ref 65–99)
POTASSIUM: 3.4 mmol/L — AB (ref 3.5–5.1)
SODIUM: 136 mmol/L (ref 135–145)

## 2015-08-20 LAB — CBC WITH DIFFERENTIAL/PLATELET
BASOS ABS: 0.1 10*3/uL (ref 0.0–0.1)
BASOS PCT: 1 %
EOS ABS: 0.6 10*3/uL (ref 0.0–0.7)
EOS PCT: 5 %
HCT: 39 % (ref 36.0–46.0)
Hemoglobin: 13 g/dL (ref 12.0–15.0)
Lymphocytes Relative: 50 %
Lymphs Abs: 5.7 10*3/uL — ABNORMAL HIGH (ref 0.7–4.0)
MCH: 28.4 pg (ref 26.0–34.0)
MCHC: 33.3 g/dL (ref 30.0–36.0)
MCV: 85.2 fL (ref 78.0–100.0)
MONO ABS: 0.7 10*3/uL (ref 0.1–1.0)
Monocytes Relative: 6 %
Neutro Abs: 4.3 10*3/uL (ref 1.7–7.7)
Neutrophils Relative %: 38 %
PLATELETS: 315 10*3/uL (ref 150–400)
RBC: 4.58 MIL/uL (ref 3.87–5.11)
RDW: 14.9 % (ref 11.5–15.5)
WBC: 11.4 10*3/uL — AB (ref 4.0–10.5)

## 2015-08-20 LAB — PREGNANCY, URINE: Preg Test, Ur: NEGATIVE

## 2015-08-20 LAB — RAPID STREP SCREEN (MED CTR MEBANE ONLY): STREPTOCOCCUS, GROUP A SCREEN (DIRECT): NEGATIVE

## 2015-08-20 MED ORDER — IPRATROPIUM-ALBUTEROL 0.5-2.5 (3) MG/3ML IN SOLN
3.0000 mL | Freq: Once | RESPIRATORY_TRACT | Status: AC
  Administered 2015-08-20: 3 mL via RESPIRATORY_TRACT
  Filled 2015-08-20: qty 3

## 2015-08-20 MED ORDER — DEXAMETHASONE SODIUM PHOSPHATE 4 MG/ML IJ SOLN
10.0000 mg | Freq: Once | INTRAMUSCULAR | Status: DC
Start: 2015-08-20 — End: 2015-08-20

## 2015-08-20 MED ORDER — DEXAMETHASONE SODIUM PHOSPHATE 4 MG/ML IJ SOLN
10.0000 mg | Freq: Once | INTRAMUSCULAR | Status: AC
  Administered 2015-08-20: 10 mg via INTRAMUSCULAR
  Filled 2015-08-20: qty 3

## 2015-08-20 NOTE — ED Provider Notes (Signed)
[facility] Provider Note   CSN: [id number] Arrival date & time: 08/20/15  2103  First Provider Contact:  None    By signing my name below, I, Majel Homer, attest that this documentation has been prepared under the direction and in the presence of Lavera Guise, MD . Electronically Signed: Majel Homer, Scribe. 08/20/2015. 10:07 PM.  History   Chief Complaint Chief Complaint  Patient presents with  . Sore Throat   The history is provided by the patient. No language interpreter was used.   HPI Comments: Sara Carpenter is a 25 y.o. female who presents to the Emergency Department complaining of gradually worsening, constant, 8/10, sore throat that began ~1 week ago and worsened today. She reports her sore throat is exacerbated at night in which she is unable to eat or drink anything due to pain. She states associated difficulty swallowing, wheezing, post nasal drip, congestion and mild left ear pain, and non-productive cough. Cough and wheezing ongoing for 2.5 months. She also states associated headache; though, she notes a hx of migraines that occur behind her left eye. She reports she was seen at [facility] ED on 7/25 for chest pain, shortness of breath, cough and sore throat in which she was told she had asthma and prescribed an inhaler and steroids. She states she uses her inhaler ~4 times a day with relief of her cough. Pt reports no relief of her sore throat with her prescribed medications. She denies fever, leg swelling, sick contacts and hx of smoking and allergies. Pt states she does not currently have a PCP.   Past Medical History:  Diagnosis Date  . Bipolar 1 disorder (HCC)   . Infected tooth   . Obesity   . PCOS (polycystic ovarian syndrome)    "When I was young". No medical intervention.    Patient Active Problem List   Diagnosis Date Noted  . Morbid obesity (HCC)   . Encounter for routine screening for malformation using ultrasonics   . [redacted] weeks gestation of pregnancy   .  Abscess of breast 12/10/2012  . Sebaceous cyst 12/10/2012    Past Surgical History:  Procedure Laterality Date  . DILATION AND CURETTAGE OF UTERUS N/A 09/03/2012   Procedure: SUCTION DILATATION AND CURETTAGE;  Surgeon: Lazaro Arms, MD;  Location: [facility];  Service: Gynecology;  Laterality: N/A;  . TONSILLECTOMY      OB History    Gravida Para Term Preterm AB Living   3       2 0   SAB TAB Ectopic Multiple Live Births   2               Home Medications    Prior to Admission medications   Medication Sig Start Date End Date Taking? Authorizing Provider  albuterol (PROVENTIL HFA;VENTOLIN HFA) 108 (90 Base) MCG/ACT inhaler Inhale 1-2 puffs into the lungs every 6 (six) hours as needed for wheezing or shortness of breath.   Yes Historical Provider, MD  Zinc Gluconate (COLD-EEZE) 13.3 MG LOZG Use as directed 1 lozenge in the mouth or throat daily as needed (for sore throat).   Yes Historical Provider, MD  azithromycin (ZITHROMAX) 250 MG tablet Take 1 tablet (250 mg total) by mouth daily. Take first 2 tablets together, then 1 every day until finished. 08/21/15   Lavera Guise, MD  Cetirizine HCl 10 MG CAPS Take 1 capsule (10 mg total) by mouth daily. 08/21/15   Lavera Guise, MD  famotidine (PEPCID) 20  MG tablet Take 1 tablet (20 mg total) by mouth 2 (two) times daily. Patient not taking: Reported on 08/20/2015 05/30/15   Bethel Born, PA-C  ondansetron (ZOFRAN) 4 MG tablet Take 1 tablet (4 mg total) by mouth every 6 (six) hours. Patient not taking: Reported on 08/20/2015 05/30/15   Bethel Born, PA-C    Family History Family History  Problem Relation Age of Onset  . Diabetes Paternal Grandmother   . Diabetes Paternal Grandfather   . Cancer Mother     cervicle  . Cancer Paternal Uncle     lung  . Cancer Maternal Grandmother     lung  . Cancer Maternal Grandfather     throat    Social History Social History  Substance Use Topics  . Smoking status: Former Smoker    Packs/day:  0.00    Years: 2.00    Types: Cigarettes    Quit date: 08/25/2012  . Smokeless tobacco: Never Used  . Alcohol use No     Comment: Rare; not now     Allergies   Carbamazepine   Review of Systems Review of Systems 10/14 systems reviewed and all are negative for acute change except as noted in the HPI.  Physical Exam Updated Vital Signs BP 117/82 (BP Location: Left Arm)   Pulse 116   Temp 98.1 F (36.7 C) (Oral)   Resp 20   Ht 5\' 6"  (1.676 m)   Wt 280 lb (127 kg)   LMP 08/15/2015   SpO2 100%   BMI 45.19 kg/m   Physical Exam Physical Exam  Nursing note and vitals reviewed. Constitutional: Well developed, well nourished, non-toxic, and in no acute distress Head: Normocephalic and atraumatic.  Mouth/Throat: Oropharynx is clear and moist. posterior oral pharyngeal erythema no exudates or swelling  Neck: Normal range of motion. Neck supple. Palpable left submental lymph node.  Cardiovascular: Normal rate and regular rhythm.   Pulmonary/Chest: Effort normal and breath sounds normal. occasional wheeze  Abdominal: Soft. There is no tenderness. There is no rebound and no guarding.  Musculoskeletal: Normal range of motion.  Neurological: Alert, no facial droop, fluent speech, moves all extremities symmetrically Skin: Skin is warm and dry.  Psychiatric: Cooperative  ED Treatments / Results  Labs (all labs ordered are listed, but only abnormal results are displayed) Labs Reviewed  CBC WITH DIFFERENTIAL/PLATELET - Abnormal; Notable for the following:       Result Value   WBC 11.4 (*)    Lymphs Abs 5.7 (*)    All other components within normal limits  BASIC METABOLIC PANEL - Abnormal; Notable for the following:    Potassium 3.4 (*)    Glucose, Bld 102 (*)    Calcium 8.6 (*)    All other components within normal limits  RAPID STREP SCREEN (NOT AT [facility])  CULTURE, GROUP A STREP [facility])  PREGNANCY, URINE   EKG  EKG Interpretation None       Radiology Dg Chest 2  View  Result Date: 08/20/2015 CLINICAL DATA:  Cough.  Sore throat for 2 weeks. EXAM: CHEST  2 VIEW COMPARISON:  Radiographs 08/09/2015 FINDINGS: The cardiomediastinal contours are normal. Mild central bronchial thickening, increased from prior. Pulmonary vasculature is normal. No consolidation, pleural effusion, or pneumothorax. No acute osseous abnormalities are seen. IMPRESSION: Mild central bronchial thickening may reflect bronchitis. This is increased from prior exam. No evidence of pneumonia. Electronically Signed   By: Rubye Oaks M.D.   On: 08/20/2015 22:58   Procedures  Procedures  DIAGNOSTIC STUDIES:  Oxygen Saturation is 100% on RA, normal by my interpretation.    COORDINATION OF CARE:  10:02 PM Discussed treatment plan, which includes breathing treatment and CXR with pt at bedside and pt agreed to plan.   Medications Ordered in ED Medications  ipratropium-albuterol (DUONEB) 0.5-2.5 (3) MG/3ML nebulizer solution 3 mL (3 mLs Nebulization Given 08/20/15 2219)  dexamethasone (DECADRON) injection 10 mg (10 mg Intramuscular Given 08/20/15 2214)   Initial Impression / Assessment and Plan / ED Course  I have reviewed the triage vital signs and the nursing notes.  Pertinent labs & imaging results that were available during my care of the patient were reviewed by me and considered in my medical decision making (see chart for details).  Clinical Course   With 1 week sore throat and 2.5 months cough and wheezing, improved by inhaler. Is well-appearing and in no acute distress. Breathing comfortably. No airway concerns. Is afebrile. Oropharynx is clear but with some posterior oropharyngeal erythema. Strep is negative. Lung with occasional wheeze. Chest x-ray without pneumonia, but evidence of bronchitis. Given breathing treatment, Decadron, and we'll do a trial of azithromycin given 2.5 months of cough with wheezing. Also question potential allergy given significant postnasal drip that could  be also contributing to cough and sore throat. Feels improved after treatment. Stable for discharge. Strict return and follow-up instructions reviewed. She expressed understanding of all discharge instructions and felt comfortable with the plan of care.   The patient appears reasonably screened and/or stabilized for discharge and I doubt any other medical condition or other [facility] requiring further screening, evaluation, or treatment in the ED at this time prior to discharge.   I personally performed the services described in this documentation, which was scribed in my presence. The recorded information has been reviewed and is accurate.   Final Clinical Impressions(s) / ED Diagnoses   Final diagnoses:  Bronchitis  Sore throat    New Prescriptions New Prescriptions   AZITHROMYCIN (ZITHROMAX) 250 MG TABLET    Take 1 tablet (250 mg total) by mouth daily. Take first 2 tablets together, then 1 every day until finished.   CETIRIZINE HCL 10 MG CAPS    Take 1 capsule (10 mg total) by mouth daily.     Lavera Guise, MD 08/21/15 [phone]

## 2015-08-20 NOTE — ED Notes (Signed)
Patient transported to X-ray 

## 2015-08-20 NOTE — ED Notes (Signed)
Spec to lab- pt reports that she did not follow up after last visit with her physician because she has none- she also reports that she did not read her discharge instructions nor call the physician referral line because she didn't want to read the instructions.

## 2015-08-20 NOTE — ED Triage Notes (Signed)
Patient reports of sire throat x2 weeks. States she was seen here given medications with no relief.

## 2015-08-20 NOTE — ED Notes (Signed)
Report from [place], [place]

## 2015-08-20 NOTE — ED Notes (Signed)
Pt returned from xray

## 2015-08-21 MED ORDER — AZITHROMYCIN 250 MG PO TABS: 250.0000 mg | ORAL_TABLET | Freq: Every day | ORAL | 0 refills | Status: DC

## 2015-08-21 MED ORDER — CETIRIZINE HCL 10 MG PO CAPS: 10.0000 mg | ORAL_CAPSULE | Freq: Every day | ORAL | 0 refills | Status: DC

## 2015-08-21 NOTE — ED Notes (Signed)
Pt discharged using teach back. Emphasis on referral number for PCP as well as discussion of calling her insurance company ([employer] per pt), for referral and to ensure that PCP in insurance network. Education regarding RX, follow up and numbers provided for referral and suicide hotline

## 2015-08-21 NOTE — Discharge Instructions (Signed)
You are strep negative. Your x-ray shows potential bronchitis and since your have had 2 months of cough symptoms with wheezing you are given a trial of antibiotics. Please establish PCP to follow your symptoms.  Return for worsening symptoms, including difficulty breathing, throat swelling, unable to swallow saliva, fevers, or any other symptoms concerning to you.

## 2015-08-23 LAB — CULTURE, GROUP A STREP (THRC)

## 2016-02-27 ENCOUNTER — Encounter: Payer: Self-pay | Admitting: Women's Health

## 2016-02-27 ENCOUNTER — Ambulatory Visit (INDEPENDENT_AMBULATORY_CARE_PROVIDER_SITE_OTHER): Payer: Medicaid Other | Admitting: Women's Health

## 2016-02-27 ENCOUNTER — Other Ambulatory Visit (HOSPITAL_COMMUNITY)
Admission: RE | Admit: 2016-02-27 | Discharge: 2016-02-27 | Disposition: A | Discharge status: Home / Self Care | Payer: Medicaid Other | Source: Ambulatory Visit | Attending: Obstetrics & Gynecology | Admitting: Obstetrics & Gynecology

## 2016-02-27 VITALS — BP 138/72 | HR 80 | Ht 66.5 in | Wt 303.0 lb

## 2016-02-27 DIAGNOSIS — Z0000 Encounter for general adult medical examination without abnormal findings: Secondary | ICD-10-CM | POA: Diagnosis not present

## 2016-02-27 DIAGNOSIS — F319 Bipolar disorder, unspecified: Secondary | ICD-10-CM | POA: Insufficient documentation

## 2016-02-27 DIAGNOSIS — Z113 Encounter for screening for infections with a predominantly sexual mode of transmission: Secondary | ICD-10-CM | POA: Insufficient documentation

## 2016-02-27 DIAGNOSIS — Z8742 Personal history of other diseases of the female genital tract: Secondary | ICD-10-CM

## 2016-02-27 DIAGNOSIS — Z3202 Encounter for pregnancy test, result negative: Secondary | ICD-10-CM

## 2016-02-27 DIAGNOSIS — Z01411 Encounter for gynecological examination (general) (routine) with abnormal findings: Secondary | ICD-10-CM | POA: Diagnosis present

## 2016-02-27 DIAGNOSIS — Z01419 Encounter for gynecological examination (general) (routine) without abnormal findings: Secondary | ICD-10-CM | POA: Diagnosis not present

## 2016-02-27 DIAGNOSIS — E669 Obesity, unspecified: Secondary | ICD-10-CM

## 2016-02-27 LAB — POCT URINE PREGNANCY: Preg Test, Ur: NEGATIVE

## 2016-02-27 MED ORDER — NORETHIN-ETH ESTRAD-FE BIPHAS 1 MG-10 MCG / 10 MCG PO TABS: 1.0000 | ORAL_TABLET | Freq: Every day | ORAL | 3 refills | Status: DC

## 2016-02-27 NOTE — Progress Notes (Signed)
Subjective:   Sara Carpenter is a 26 y.o. G23P0020 Caucasian female here for a routine well-woman exam.  Patient's last menstrual period was 01/11/2016.    Current complaints: wants to get back on birth control pills, h/o pcos, doesn't remember pills she was on in past. Wants pregnancy test, 3wks late for period, does not want to be pregnant.  Periods have been regular after having her son almost 2 years ago, until now.  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  Past Medical History Past Medical History:  Diagnosis Date  . Bipolar 1 disorder (HCC)   . Infected tooth   . Obesity   . PCOS (polycystic ovarian syndrome)    "When I was young". No medical intervention.    Past Surgical History Past Surgical History:  Procedure Laterality Date  . DILATION AND CURETTAGE OF UTERUS N/A 09/03/2012   Procedure: SUCTION DILATATION AND CURETTAGE;  Surgeon: Lazaro Arms, MD;  Location: [facility];  Service: Gynecology;  Laterality: N/A;  . TONSILLECTOMY      Gynecologic History G3P0020  Patient's last menstrual period was 01/11/2016. Contraception: none Last Pap: never. Results were: n/a Last mammogram: never. Results were: n/a Last TCS: never  Obstetric History OB History  Gravida Para Term Preterm  AB Living  3 1 1   2 1   SAB TAB Ectopic Multiple Live Births  2       1    # Outcome Date GA Lbr Len/2nd Weight Sex Delivery Anes PTL Lv  3 Term 05/14/14 [redacted]w[redacted]d  8 lb 7 oz (3.827 kg) M Vag-Spont EPI  LIV  2 SAB           1 SAB               Current Medications Current Outpatient Prescriptions on File Prior to Visit  Medication Sig Dispense Refill  . albuterol (PROVENTIL HFA;VENTOLIN HFA) 108 (90 Base) MCG/ACT inhaler Inhale 1-2 puffs into the lungs every 6 (six) hours as needed for wheezing or shortness of breath.    Marland Kitchen azithromycin (ZITHROMAX) 250 MG tablet Take 1 tablet (250 mg total) by mouth daily. Take first 2 tablets together, then 1 every day until finished. (Patient not taking: Reported on 02/27/2016) 6 tablet 0  . Cetirizine HCl 10 MG CAPS Take 1 capsule (10 mg total) by mouth daily. (Patient not taking: Reported on 02/27/2016) 30 capsule 0  . famotidine (PEPCID) 20 MG tablet Take 1 tablet (20 mg total) by mouth 2 (two) times daily. (Patient not taking: Reported on 08/20/2015) 30 tablet 0  . ondansetron (ZOFRAN) 4 MG tablet Take 1 tablet (4 mg total) by mouth every 6 (six) hours. (Patient not taking: Reported on 08/20/2015) 12 tablet 0  . Zinc Gluconate (COLD-EEZE) 13.3 MG LOZG Use as directed 1 lozenge in the mouth or throat daily as needed (for  sore throat).     No current facility-administered medications on file prior to visit.     Review of Systems Patient denies any headaches, blurred vision, shortness of breath, chest pain, abdominal pain, problems with bowel movements, urination, or intercourse.  Objective:  BP 138/72 (BP Location: Right Arm, Patient Position: Sitting, Cuff Size: Large)   Pulse 80   Ht 5' 6.5" (1.689 m)   Wt (!) 303 lb (137.4 kg)   LMP 01/11/2016   BMI 48.17 kg/m  Physical Exam  General:  Well developed, well nourished, no acute distress. She is alert and oriented x3. Skin:  Warm and dry Neck:  Midline trachea, no thyromegaly or  nodules Cardiovascular: Regular rate and rhythm, no murmur heard Lungs:  Effort normal, all lung fields clear to auscultation bilaterally Breasts:  No dominant palpable mass, retraction, or nipple discharge; 2 areas of yeast rash Abdomen:  Soft, non tender, no hepatosplenomegaly or masses Pelvic:  External genitalia is normal in appearance.  The vagina is normal in appearance. The cervix is bulbous, no CMT.  Thin prep pap is done w/ reflex HR HPV cotesting. Unable to adequately assess uterus/ovaries/adnexa d/t body habitus Extremities:  No swelling or varicosities noted Psych:  She has a normal mood and affect  Assessment:   Healthy well-woman exam H/o PCOS Contraception management Depression, prior dx of Bipolar, suicidal thoughts, no plan STD screen Skin candida bilateral breasts  Plan:  GC/CT from pap, CBC, CMP, TSH, A1C, HIV, RPR, HepB Rx LoLoestrin 3pk w/ 3RF F/U 3mths for coc f/u, or sooner if needed [facility], call if haven't heard from them w/in 2wks Can try otc hydrocortisone cream and yeast cream for breasts Mammogram @42yyo  or sooner if problems Colonoscopy @26yo  or sooner if problems  [name], Shann Merrick Randall CNM, [facility]NP-BC 02/27/2016 2:42 PM

## 2016-02-27 NOTE — Patient Instructions (Signed)
Primary Care Providers  Dr. Dwana MelenaZack Hall [place]) [phone]  [facility] [phone]  [facility]([place]) Dr. Karilyn CotaGosrani [phone]  [facility] [place]) [phone]  [facility] [phone]

## 2016-02-28 LAB — COMPREHENSIVE METABOLIC PANEL
ALBUMIN: 4.5 g/dL (ref 3.5–5.5)
ALT: 16 IU/L (ref 0–32)
AST: 18 IU/L (ref 0–40)
Albumin/Globulin Ratio: 1.7 (ref 1.2–2.2)
Alkaline Phosphatase: 55 IU/L (ref 39–117)
BILIRUBIN TOTAL: 0.3 mg/dL (ref 0.0–1.2)
BUN / CREAT RATIO: 17 (ref 9–23)
BUN: 13 mg/dL (ref 6–20)
CO2: 23 mmol/L (ref 18–29)
CREATININE: 0.78 mg/dL (ref 0.57–1.00)
Calcium: 9.9 mg/dL (ref 8.7–10.2)
Chloride: 105 mmol/L (ref 96–106)
GFR calc non Af Amer: 107 mL/min/{1.73_m2} (ref >59)
GFR, EST AFRICAN AMERICAN: 124 mL/min/{1.73_m2} (ref >59)
GLOBULIN, TOTAL: 2.7 g/dL (ref 1.5–4.5)
GLUCOSE: 87 mg/dL (ref 65–99)
Potassium: 4.4 mmol/L (ref 3.5–5.2)
SODIUM: 143 mmol/L (ref 134–144)
TOTAL PROTEIN: 7.2 g/dL (ref 6.0–8.5)

## 2016-02-28 LAB — HEPATITIS B SURFACE ANTIGEN: HEP B S AG: NEGATIVE

## 2016-02-28 LAB — CBC
HEMATOCRIT: 42 % (ref 34.0–46.6)
HEMOGLOBIN: 13.7 g/dL (ref 11.1–15.9)
MCH: 29.4 pg (ref 26.6–33.0)
MCHC: 32.6 g/dL (ref 31.5–35.7)
MCV: 90 fL (ref 79–97)
Platelets: 429 10*3/uL — ABNORMAL HIGH (ref 150–379)
RBC: 4.66 x10E6/uL (ref 3.77–5.28)
RDW: 13.9 % (ref 12.3–15.4)
WBC: 12.3 10*3/uL — ABNORMAL HIGH (ref 3.4–10.8)

## 2016-02-28 LAB — HEMOGLOBIN A1C
Est. average glucose Bld gHb Est-mCnc: 103 mg/dL
Hgb A1c MFr Bld: 5.2 % (ref 4.8–5.6)

## 2016-02-28 LAB — RPR: RPR: NONREACTIVE

## 2016-02-28 LAB — HIV ANTIBODY (ROUTINE TESTING W REFLEX): HIV SCREEN 4TH GENERATION: NONREACTIVE

## 2016-02-28 LAB — TSH: TSH: 1.91 u[IU]/mL (ref 0.450–4.500)

## 2016-03-02 LAB — CYTOLOGY - PAP
Chlamydia: POSITIVE — AB
Diagnosis: NEGATIVE
NEISSERIA GONORRHEA: POSITIVE — AB

## 2016-03-05 ENCOUNTER — Encounter: Payer: Self-pay | Admitting: Women's Health

## 2016-03-05 ENCOUNTER — Telehealth: Payer: Self-pay | Admitting: *Deleted

## 2016-03-05 ENCOUNTER — Other Ambulatory Visit: Payer: Self-pay | Admitting: Women's Health

## 2016-03-05 DIAGNOSIS — A64 Unspecified sexually transmitted disease: Secondary | ICD-10-CM | POA: Insufficient documentation

## 2016-03-05 MED ORDER — AZITHROMYCIN 500 MG PO TABS: 1000.0000 mg | ORAL_TABLET | Freq: Once | ORAL | 0 refills | Status: AC

## 2016-03-05 NOTE — Telephone Encounter (Signed)
Informed patient her Pap showed GC/Chl and needs to come in for Rocephin injection and prescription pick up from pharmacy. Partner needs to be treated w/ PCP or HD. No sex until at least 7d from both being treated. Return in 3-4 weeks for urine poc with lab. Patient verbalized understanding and will come in for injection ASAP.

## 2016-03-05 NOTE — Telephone Encounter (Signed)
Left message with boyfriend to get her to return my call.

## 2016-03-06 ENCOUNTER — Ambulatory Visit (INDEPENDENT_AMBULATORY_CARE_PROVIDER_SITE_OTHER): Payer: Medicaid Other | Admitting: *Deleted

## 2016-03-06 DIAGNOSIS — A749 Chlamydial infection, unspecified: Secondary | ICD-10-CM

## 2016-03-06 DIAGNOSIS — A549 Gonococcal infection, unspecified: Secondary | ICD-10-CM

## 2016-03-06 MED ORDER — CEFTRIAXONE SODIUM 250 MG IJ SOLR
250.0000 mg | Freq: Once | INTRAMUSCULAR | Status: AC
  Administered 2016-03-06: 250 mg via INTRAMUSCULAR

## 2016-03-06 NOTE — Progress Notes (Signed)
Rocephin 250mg  given IM right ventrogluteal. Pt tolerated well. Pt observed for 15 min after injection with no complications.

## 2016-03-20 ENCOUNTER — Telehealth: Payer: Self-pay | Admitting: Women's Health

## 2016-03-20 NOTE — Telephone Encounter (Signed)
Pt called stating that she would like to speak with a nurse, pt states she really needs a call back today. Pt states it is personal. Please contact pt

## 2016-03-20 NOTE — Telephone Encounter (Signed)
Informed patient that per Victorino DikeJennifer unless she is having itching and burning, she could wait until her POC visit on 3/20 to be reassessed. Pt states she is not having any of those other symptoms. Will let un know if she does.

## 2016-03-20 NOTE — Telephone Encounter (Signed)
Patient called stating she was treated for gonorrhea and chlamydia on 2/20 but states she still has a foul odor. She is concerned she may not have been completely treated. Patient states she has not been sexually active. Will discuss with NP to see if [facility]

## 2016-04-03 ENCOUNTER — Ambulatory Visit: Payer: Medicaid Other | Admitting: Adult Health

## 2016-04-03 ENCOUNTER — Encounter: Payer: Self-pay | Admitting: *Deleted

## 2016-04-12 ENCOUNTER — Encounter: Payer: Self-pay | Admitting: Women's Health

## 2016-04-12 ENCOUNTER — Ambulatory Visit (INDEPENDENT_AMBULATORY_CARE_PROVIDER_SITE_OTHER): Payer: Medicaid Other | Admitting: Women's Health

## 2016-04-12 VITALS — BP 100/70 | HR 72 | Wt 302.8 lb

## 2016-04-12 DIAGNOSIS — N898 Other specified noninflammatory disorders of vagina: Secondary | ICD-10-CM | POA: Diagnosis not present

## 2016-04-12 DIAGNOSIS — N760 Acute vaginitis: Secondary | ICD-10-CM | POA: Diagnosis not present

## 2016-04-12 DIAGNOSIS — Z113 Encounter for screening for infections with a predominantly sexual mode of transmission: Secondary | ICD-10-CM | POA: Diagnosis not present

## 2016-04-12 DIAGNOSIS — B9689 Other specified bacterial agents as the cause of diseases classified elsewhere: Secondary | ICD-10-CM | POA: Diagnosis not present

## 2016-04-12 LAB — POCT WET PREP (WET MOUNT)
Clue Cells Wet Prep Whiff POC: POSITIVE
TRICHOMONAS WET PREP HPF POC: ABSENT

## 2016-04-12 MED ORDER — METRONIDAZOLE 500 MG PO TABS: 500.0000 mg | ORAL_TABLET | Freq: Two times a day (BID) | ORAL | 0 refills | Status: DC

## 2016-04-12 NOTE — Progress Notes (Signed)
   [facility]  Patient name: Sara Carpenter MRN [id number]  Date of birth: December 08, 1990  CC & HPI:  Sara BalzarineJuanita R Carpenter is a 26 y.o. 403P1021 Caucasian female presenting today for poc of gc/ct. Also reports malodorous d/c, no itching/irritation.  Patient's last menstrual period was 04/07/2016. The current method of family planning is OCP (estrogen/progesterone). Last pap 02/27/16 neg  Pertinent History Reviewed:  Medical & Surgical Hx:   Past medical, surgical, family, and social history reviewed in electronic medical record Medications: Reviewed & Updated - see associated section Allergies: Reviewed in electronic medical record  Objective Findings:  Vitals: BP 100/70   Pulse 72   Wt (!) 302 lb 12.8 oz (137.3 kg)   LMP 04/07/2016   BMI 48.14 kg/m  Body mass index is 48.14 kg/m.  Physical Examination: General appearance - alert, well appearing, and in no distress Pelvic - scant white slightly malodorous d/c, wet prep and gc/ct collected  Results for orders placed or performed in visit on 04/12/16 (from the past 24 hour(s))  POCT Wet Prep Mellody Drown(Wet [place])   Collection Time: 04/12/16 12:09 PM  Result Value Ref Range   Source Wet Prep POC vaginal    WBC, Wet Prep HPF POC mod    Bacteria Wet Prep HPF POC None (A) Few   BACTERIA WET PREP MORPHOLOGY POC     Clue Cells Wet Prep HPF POC Moderate (A) None   Clue Cells Wet Prep Whiff POC Positive Whiff    Yeast Wet Prep HPF POC None    KOH Wet Prep POC     Trichomonas Wet Prep HPF POC Absent Absent     Assessment & Plan:  A:   GC/CT POC  BV  P:  Rx metronidazole 500mg  BID x 7d for BV, no sex or etoh while taking   GC/CT sent  Return for As scheduled. for COC f/u  Sara DuncansBooker, Sara Carpenter CNM, [facility]NP[facility]BC 04/12/2016 12:09 PM

## 2016-04-14 LAB — GC/CHLAMYDIA PROBE AMP
Chlamydia trachomatis, NAA: NEGATIVE
Neisseria gonorrhoeae by PCR: NEGATIVE

## 2016-04-18 ENCOUNTER — Telehealth: Payer: Self-pay | Admitting: *Deleted

## 2016-04-18 NOTE — Telephone Encounter (Signed)
Pt informed of negative GC/CHL tests.

## 2016-05-28 ENCOUNTER — Ambulatory Visit: Payer: Medicaid Other | Admitting: Women's Health

## 2016-06-07 ENCOUNTER — Ambulatory Visit: Payer: Medicaid Other | Admitting: Women's Health

## 2016-09-27 ENCOUNTER — Ambulatory Visit: Payer: Self-pay | Admitting: Women's Health

## 2016-10-01 ENCOUNTER — Ambulatory Visit: Payer: Self-pay | Admitting: Women's Health

## 2016-10-16 ENCOUNTER — Ambulatory Visit: Payer: Medicaid Other | Admitting: Women's Health

## 2016-10-22 ENCOUNTER — Ambulatory Visit (INDEPENDENT_AMBULATORY_CARE_PROVIDER_SITE_OTHER): Payer: Medicaid Other | Admitting: Women's Health

## 2016-10-22 ENCOUNTER — Encounter: Payer: Self-pay | Admitting: Women's Health

## 2016-10-22 VITALS — BP 100/70 | HR 78 | Temp 98.2°F | Wt 305.0 lb

## 2016-10-22 DIAGNOSIS — Z113 Encounter for screening for infections with a predominantly sexual mode of transmission: Secondary | ICD-10-CM | POA: Diagnosis not present

## 2016-10-22 DIAGNOSIS — R8290 Unspecified abnormal findings in urine: Secondary | ICD-10-CM

## 2016-10-22 DIAGNOSIS — B9689 Other specified bacterial agents as the cause of diseases classified elsewhere: Secondary | ICD-10-CM

## 2016-10-22 DIAGNOSIS — N898 Other specified noninflammatory disorders of vagina: Secondary | ICD-10-CM

## 2016-10-22 DIAGNOSIS — N760 Acute vaginitis: Secondary | ICD-10-CM | POA: Diagnosis not present

## 2016-10-22 DIAGNOSIS — N3000 Acute cystitis without hematuria: Secondary | ICD-10-CM

## 2016-10-22 LAB — POCT WET PREP (WET MOUNT)
Clue Cells Wet Prep Whiff POC: POSITIVE
Trichomonas Wet Prep HPF POC: ABSENT

## 2016-10-22 LAB — POCT URINALYSIS DIPSTICK
GLUCOSE UA: NEGATIVE
Ketones, UA: NEGATIVE
RBC UA: NEGATIVE

## 2016-10-22 MED ORDER — SULFAMETHOXAZOLE-TRIMETHOPRIM 800-160 MG PO TABS: 1.0000 | ORAL_TABLET | Freq: Two times a day (BID) | ORAL | 0 refills | Status: DC

## 2016-10-22 MED ORDER — METRONIDAZOLE 500 MG PO TABS: 500.0000 mg | ORAL_TABLET | Freq: Two times a day (BID) | ORAL | 0 refills | Status: DC

## 2016-10-22 MED ORDER — ETONOGESTREL-ETHINYL ESTRADIOL 0.12-0.015 MG/24HR VA RING: VAGINAL_RING | VAGINAL | 12 refills | Status: DC

## 2016-10-22 NOTE — Patient Instructions (Signed)
USE CONDOMS!!!  Ethinyl Estradiol; Etonogestrel vaginal ring What is this medicine? ETHINYL ESTRADIOL; ETONOGESTREL (ETH in il es tra DYE ole; et oh noe JES trel) vaginal ring is a flexible, vaginal ring used as a contraceptive (birth control method). This medicine combines two types of female hormones, an estrogen and a progestin. This ring is used to prevent ovulation and pregnancy. Each ring is effective for one month. This medicine may be used for other purposes; ask your health care provider or pharmacist if you have questions. COMMON BRAND NAME(S): NuvaRing What should I tell my health care provider before I take this medicine? They need to know if you have or ever had any of these conditions: -abnormal vaginal bleeding -blood vessel disease or blood clots -breast, cervical, endometrial, ovarian, liver, or uterine cancer -diabetes -gallbladder disease -heart disease or recent heart attack -high blood pressure -high cholesterol -kidney disease -liver disease -migraine headaches -stroke -systemic lupus erythematosus (SLE) -tobacco smoker -an unusual or allergic reaction to estrogens, progestins, other medicines, foods, dyes, or preservatives -pregnant or trying to get pregnant -breast-feeding How should I use this medicine? Insert the ring into your vagina as directed. Follow the directions on the prescription label. The ring will remain place for 3 weeks and is then removed for a 1-week break. A new ring is inserted 1 week after the last ring was removed, on the same day of the week. Check often to make sure the ring is still in place, especially before and after sexual intercourse. If the ring was out of the vagina for an unknown amount of time, you may not be protected from pregnancy. Perform a pregnancy test and call your doctor. Do not use more often than directed. A patient package insert for the product will be given with each prescription and refill. Read this sheet carefully  each time. The sheet may change frequently. Contact your pediatrician regarding the use of this medicine in children. Special care may be needed. This medicine has been used in female children who have started having menstrual periods. Overdosage: If you think you have taken too much of this medicine contact a poison control center or emergency room at once. NOTE: This medicine is only for you. Do not share this medicine with others. What if I miss a dose? You will need to replace your vaginal ring once a month as directed. If the ring should slip out, or if you leave it in longer or shorter than you should, contact your health care professional for advice. What may interact with this medicine? Do not take this medicine with the following medication: -dasabuvir; ombitasvir; paritaprevir; ritonavir -ombitasvir; paritaprevir; ritonavir This medicine may also interact with the following medications: -acetaminophen -antibiotics or medicines for infections, especially rifampin, rifabutin, rifapentine, and griseofulvin, and possibly penicillins or tetracyclines -aprepitant -ascorbic acid (vitamin C) -atorvastatin -barbiturate medicines, such as phenobarbital -bosentan -carbamazepine -caffeine -clofibrate -cyclosporine -dantrolene -doxercalciferol -felbamate -grapefruit juice -hydrocortisone -medicines for anxiety or sleeping problems, such as diazepam or temazepam -medicines for diabetes, including pioglitazone -modafinil -mycophenolate -nefazodone -oxcarbazepine -phenytoin -prednisolone -ritonavir or other medicines for HIV infection or AIDS -rosuvastatin -selegiline -soy isoflavones supplements -St. John's wort -tamoxifen or raloxifene -theophylline -thyroid hormones -topiramate -warfarin This list may not describe all possible interactions. Give your health care provider a list of all the medicines, herbs, non-prescription drugs, or dietary supplements you use. Also tell  them if you smoke, drink alcohol, or use illegal drugs. Some items may interact with your medicine. What should I watch  for while using this medicine? Visit your doctor or health care professional for regular checks on your progress. You will need a regular breast and pelvic exam and Pap smear while on this medicine. Use an additional method of contraception during the first cycle that you use this ring. Do not use a diaphragm or female condom, as the ring can interfere with these birth control methods and their proper placement. If you have any reason to think you are pregnant, stop using this medicine right away and contact your doctor or health care professional. If you are using this medicine for hormone related problems, it may take several cycles of use to see improvement in your condition. Smoking increases the risk of getting a blood clot or having a stroke while you are using hormonal birth control, especially if you are more than 24 years old. You are strongly advised not to smoke. This medicine can make your body retain fluid, making your fingers, hands, or ankles swell. Your blood pressure can go up. Contact your doctor or health care professional if you feel you are retaining fluid. This medicine can make you more sensitive to the sun. Keep out of the sun. If you cannot avoid being in the sun, wear protective clothing and use sunscreen. Do not use sun lamps or tanning beds/booths. If you wear contact lenses and notice visual changes, or if the lenses begin to feel uncomfortable, consult your eye care specialist. In some women, tenderness, swelling, or minor bleeding of the gums may occur. Notify your dentist if this happens. Brushing and flossing your teeth regularly may help limit this. See your dentist regularly and inform your dentist of the medicines you are taking. If you are going to have elective surgery, you may need to stop using this medicine before the surgery. Consult your health  care professional for advice. This medicine does not protect you against HIV infection (AIDS) or any other sexually transmitted diseases. What side effects may I notice from receiving this medicine? Side effects that you should report to your doctor or health care professional as soon as possible: -breast tissue changes or discharge -changes in vaginal bleeding during your period or between your periods -chest pain -coughing up blood -dizziness or fainting spells -headaches or migraines -leg, arm or groin pain -severe or sudden headaches -stomach pain (severe) -sudden shortness of breath -sudden loss of coordination, especially on one side of the body -speech problems -symptoms of vaginal infection like itching, irritation or unusual discharge -tenderness in the upper abdomen -vomiting -weakness or numbness in the arms or legs, especially on one side of the body -yellowing of the eyes or skin Side effects that usually do not require medical attention (report to your doctor or health care professional if they continue or are bothersome): -breakthrough bleeding and spotting that continues beyond the 3 initial cycles of pills -breast tenderness -mood changes, anxiety, depression, frustration, anger, or emotional outbursts -increased sensitivity to sun or ultraviolet light -nausea -skin rash, acne, or brown spots on the skin -weight gain (slight) This list may not describe all possible side effects. Call your doctor for medical advice about side effects. You may report side effects to FDA at 1-800-FDA-1088. Where should I keep my medicine? Keep out of the reach of children. Store at room temperature between 15 and 30 degrees C (59 and 86 degrees F) for up to 4 months. The product will expire after 4 months. Protect from light. Throw away any unused medicine after the expiration  date. NOTE: This sheet is a summary. It may not cover all possible information. If you have questions about  this medicine, talk to your doctor, pharmacist, or health care provider.  2018 Elsevier/Gold Standard (2015-09-09 17:00:31)

## 2016-10-22 NOTE — Progress Notes (Signed)
[facility] Visit  Patient name: Sara Carpenter MRN [id number]  Date of birth: [birth year] CC & HPI:  Sara Carpenter is a 26 y.o. [phone] Caucasian female being seen today for report of malodorous urine for few weeks. Denies urinary frequency/urgency/hesitancy/dysuria.  Also reports malodorous vaginal discharge for a few weeks, no itching/irritation.  Requests STD screening, guy she is currently with was dx w/ gonorrhea. They have not been using condoms. Pt had GC & CT 02/27/16, was treated and had neg poc.  States she never started her birth control pills. Moved, then just didn't remember to start them. Reviewed all types of contraception, pt wants to try nuvaring. Does not smoke, no h/o HTN, DVT/PE, CVA, MI, or migraines w/ aura.   Patient's last menstrual period was 09/20/2016. The current method of family planning is none and wants nuvaring. Last pap 02/27/16. Results were:  normal Review of Systems:   Denies any headaches, blurred vision, fatigue, shortness of breath, chest pain, abdominal pain, abnormal vaginal discharge/itching/odor/irritation, problems with periods, bowel movements, urination, or intercourse unless otherwise stated above.  Pertinent History Reviewed:  Reviewed past medical,surgical, social and family history.  Reviewed problem list, medications and allergies. Objective Findings:   Vitals:   10/22/16 1413  BP: 100/70  Pulse: 78  Temp: 98.2 F (36.8 C)  Weight: (!) 305 lb (138.3 kg)    Body mass index is 48.49 kg/m.  Physical Examination: General appearance - well appearing, and in no distress Mental status - alert, oriented to person, place, and time Pelvic - normal external genitalia, vulva, vagina, cervix, small amt thin white malodorous d/c, gc/ct and wet prep obtained  UPT: neg Urine dipstick: +nitrates  Results for orders placed or performed in visit on 10/22/16 (from the past 24 hour(s))  POCT Wet Prep Mellody Drown [place])   Collection  Time: 10/22/16  2:54 PM  Result Value Ref Range   Source Wet Prep POC vaginal    WBC, Wet Prep HPF POC mod    Bacteria Wet Prep HPF POC None (A) Few   BACTERIA WET PREP MORPHOLOGY POC     Clue Cells Wet Prep HPF POC Many (A) None   Clue Cells Wet Prep Whiff POC Positive Whiff    Yeast Wet Prep HPF POC None    KOH Wet Prep POC     Trichomonas Wet Prep HPF POC Absent Absent  POCT urinalysis dipstick   Collection Time: 10/22/16  2:59 PM  Result Value Ref Range   Color, UA     Clarity, UA     Glucose, UA neg    Bilirubin, UA     Ketones, UA neg    Spec Grav, UA  1.010 - 1.025   Blood, UA neg    pH, UA  5.0 - 8.0   Protein, UA trace    Urobilinogen, UA  0.2 or 1.0 E.U./dL   Nitrite, UA postive    Leukocytes, UA  Negative    Assessment & Plan:  1) BV> Rx metronidazole  BID x 7d for BV, no sex or etoh while taking  2) STD screening> sent swab for gc/ct, recommended always use condoms! 3) Contraception management> rx nuvaring, discussed proper use, recommended checking placement after sex, etc 4) UTI> rx bactrim bid x 7d> send urine cx  Orders Placed This Encounter  Procedures  . GC/Chlamydia Probe Amp  . Urine Culture  . POCT Wet Prep [employer])  . POCT urinalysis dipstick    Return  for after 2/12 for physical.  Marge Duncans CNM, [facility] 10/22/2016 3:13 PM

## 2016-10-24 LAB — GC/CHLAMYDIA PROBE AMP
Chlamydia trachomatis, NAA: NEGATIVE
Neisseria gonorrhoeae by PCR: POSITIVE — AB

## 2016-10-25 ENCOUNTER — Other Ambulatory Visit: Payer: Self-pay | Admitting: Women's Health

## 2016-10-25 ENCOUNTER — Telehealth: Payer: Self-pay | Admitting: *Deleted

## 2016-10-25 LAB — URINE CULTURE

## 2016-10-25 MED ORDER — AZITHROMYCIN 500 MG PO TABS: 1000.0000 mg | ORAL_TABLET | Freq: Once | ORAL | 0 refills | Status: AC

## 2016-10-25 NOTE — Telephone Encounter (Signed)
LMOVM returning call again.

## 2016-10-25 NOTE — Telephone Encounter (Signed)
LMOVM to return call.

## 2016-10-26 NOTE — Telephone Encounter (Signed)
Phone not accepting calls at this time.

## 2016-10-29 ENCOUNTER — Ambulatory Visit (INDEPENDENT_AMBULATORY_CARE_PROVIDER_SITE_OTHER): Payer: Medicaid Other | Admitting: *Deleted

## 2016-10-29 ENCOUNTER — Telehealth: Payer: Self-pay | Admitting: *Deleted

## 2016-10-29 DIAGNOSIS — A549 Gonococcal infection, unspecified: Secondary | ICD-10-CM

## 2016-10-29 MED ORDER — CEFTRIAXONE SODIUM 250 MG IJ SOLR
250.0000 mg | Freq: Once | INTRAMUSCULAR | Status: AC
  Administered 2016-10-29: 250 mg via INTRAMUSCULAR

## 2016-10-29 NOTE — Telephone Encounter (Signed)
Informed patient of positvie gonorrhea. Informed she needed IM rocephin in office and azithromycin from pharmacy along with POC in 3-4 weeks. Appt made for today for injection and 11/7 for POC. Partner to be treated by PCP.

## 2016-10-29 NOTE — Progress Notes (Signed)
Pt in for treatment with rocephin. Rocephin 250 mg IM given in left upper outer. Pt tolerated well. Patient agreed to wait 15 min to rule out any adverse reactions.

## 2016-10-30 ENCOUNTER — Telehealth: Payer: Self-pay | Admitting: *Deleted

## 2016-10-30 NOTE — Telephone Encounter (Signed)
Spoke with pt. Pt was + for gonorrhea. Pt is on Nuvaring and was wondering when she puts in a new ring. I advised she can go ahead and place a new ring if she has had it out for the days she is supposed to have it out. Advised no sex until after POC appt. Pt voiced understanding. JSY

## 2016-10-30 NOTE — Telephone Encounter (Signed)
Left message x 1. JSY 

## 2016-11-21 ENCOUNTER — Other Ambulatory Visit: Payer: Self-pay

## 2017-01-09 ENCOUNTER — Encounter (HOSPITAL_COMMUNITY): Payer: Self-pay

## 2017-01-09 ENCOUNTER — Emergency Department (HOSPITAL_COMMUNITY): Payer: Medicaid Other

## 2017-01-09 ENCOUNTER — Emergency Department (HOSPITAL_COMMUNITY)
Admission: EM | Admit: 2017-01-09 | Discharge: 2017-01-09 | Disposition: A | Discharge status: Home / Self Care | Payer: Medicaid Other | Attending: Emergency Medicine | Admitting: Emergency Medicine

## 2017-01-09 ENCOUNTER — Other Ambulatory Visit: Payer: Self-pay

## 2017-01-09 DIAGNOSIS — Z87891 Personal history of nicotine dependence: Secondary | ICD-10-CM | POA: Diagnosis not present

## 2017-01-09 DIAGNOSIS — Z79899 Other long term (current) drug therapy: Secondary | ICD-10-CM | POA: Insufficient documentation

## 2017-01-09 DIAGNOSIS — J069 Acute upper respiratory infection, unspecified: Secondary | ICD-10-CM

## 2017-01-09 DIAGNOSIS — J029 Acute pharyngitis, unspecified: Secondary | ICD-10-CM | POA: Diagnosis present

## 2017-01-09 DIAGNOSIS — J9801 Acute bronchospasm: Secondary | ICD-10-CM | POA: Diagnosis not present

## 2017-01-09 DIAGNOSIS — J209 Acute bronchitis, unspecified: Secondary | ICD-10-CM

## 2017-01-09 LAB — RAPID STREP SCREEN (MED CTR MEBANE ONLY): STREPTOCOCCUS, GROUP A SCREEN (DIRECT): NEGATIVE

## 2017-01-09 MED ORDER — ALBUTEROL SULFATE (2.5 MG/3ML) 0.083% IN NEBU: 5.0000 mg | INHALATION_SOLUTION | Freq: Once | RESPIRATORY_TRACT | Status: DC

## 2017-01-09 MED ORDER — AEROCHAMBER Z-STAT PLUS/MEDIUM MISC
1.0000 | Freq: Once | Status: AC
  Administered 2017-01-09: 1

## 2017-01-09 MED ORDER — ALBUTEROL SULFATE (2.5 MG/3ML) 0.083% IN NEBU
2.5000 mg | INHALATION_SOLUTION | Freq: Once | RESPIRATORY_TRACT | Status: AC
  Administered 2017-01-09: 2.5 mg via RESPIRATORY_TRACT
  Filled 2017-01-09: qty 3

## 2017-01-09 MED ORDER — IPRATROPIUM-ALBUTEROL 0.5-2.5 (3) MG/3ML IN SOLN
3.0000 mL | Freq: Once | RESPIRATORY_TRACT | Status: AC
  Administered 2017-01-09: 3 mL via RESPIRATORY_TRACT
  Filled 2017-01-09: qty 3

## 2017-01-09 MED ORDER — ALBUTEROL SULFATE HFA 108 (90 BASE) MCG/ACT IN AERS
2.0000 | INHALATION_SPRAY | Freq: Four times a day (QID) | RESPIRATORY_TRACT | Status: DC | PRN
  Administered 2017-01-09: 2 via RESPIRATORY_TRACT
  Filled 2017-01-09: qty 6.7

## 2017-01-09 MED ORDER — IPRATROPIUM BROMIDE 0.02 % IN SOLN: 0.5000 mg | Freq: Once | RESPIRATORY_TRACT | Status: DC

## 2017-01-09 NOTE — ED Triage Notes (Signed)
Sore throat for over a week, states is hard to swallow.  Pt denies fevers at home.

## 2017-01-09 NOTE — ED Provider Notes (Signed)
[facility] EMERGENCY DEPARTMENT Provider Note   CSN: [id number] Arrival date & time: 01/09/17  0027  Time seen 12:57 AM   History   Chief Complaint Chief Complaint  Patient presents with  . Sore Throat    HPI Sara Carpenter is a 26 y.o. female.  HPI patient states she has had a cough for over a week.  She denies fever.  She states she was coughing up green mucus and now there is black in it.  Patient is a smoker we discussed that is most likely nicotine.  She had some rhinorrhea but it is gone now.  She also states her throat hurts especially when she coughs.  It also hurts when she swallows but when she is just resting she has no discomfort.  She denies any fever.  She states she is also having wheezing especially at night.  She states she has been on inhalers in the past but she ran out.  She states her child had similar symptoms right before the patient started getting ill.  PCP none   Past Medical History:  Diagnosis Date  . Bipolar 1 disorder (HCC)   . Infected tooth   . Obesity   . PCOS (polycystic ovarian syndrome)    "When I was young". No medical intervention.    Patient Active Problem List   Diagnosis Date Noted  . Gonorrhea & Chlamydia 03/05/2016  . Bipolar disorder (HCC) 02/27/2016  . History of PCOS 02/27/2016  . Morbid obesity (HCC)   . Abscess of breast 12/10/2012  . Sebaceous cyst 12/10/2012    Past Surgical History:  Procedure Laterality Date  . DILATION AND CURETTAGE OF UTERUS N/A 09/03/2012   Procedure: SUCTION DILATATION AND CURETTAGE;  Surgeon: Lazaro ArmsLuther H Eure, MD;  Location: [facility];  Service: Gynecology;  Laterality: N/A;  . TONSILLECTOMY      OB History    Gravida Para Term Preterm AB Living   3 1 1   2 1    SAB TAB Ectopic Multiple Live Births   2       1       Home Medications    Prior to Admission medications   Medication Sig Start Date End Date Taking? Authorizing Provider  albuterol (PROVENTIL HFA;VENTOLIN HFA) 108 (90 Base)  MCG/ACT inhaler Inhale 1-2 puffs into the lungs every 6 (six) hours as needed for wheezing or shortness of breath.    [provider]  etonogestrel-ethinyl estradiol (NUVARING) 0.12-0.015 MG/24HR vaginal ring Insert vaginally and leave in place for 3 consecutive weeks, then remove for 1 week. 10/22/16   Cheral MarkerBooker, Kimberly R, CNM  metroNIDAZOLE (FLAGYL) 500 MG tablet Take 1 tablet (500 mg total) by mouth 2 (two) times daily. X 7 days. No sex or alcohol while taking 10/22/16   Cheral MarkerBooker, Kimberly R, CNM  sulfamethoxazole-trimethoprim (BACTRIM DS,SEPTRA DS) 800-160 MG tablet Take 1 tablet by mouth 2 (two) times daily. X 7 days 10/22/16   Cheral MarkerBooker, Kimberly R, CNM    Family History Family History  Problem Relation Age of Onset  . Diabetes Paternal Grandmother   . Diabetes Paternal Grandfather   . Cancer Mother        cervicle  . Cancer Paternal Uncle        lung  . Cancer Maternal Grandmother        lung  . Cancer Maternal Grandfather        throat    Social History Social History   Tobacco Use  . Smoking  status: Former Smoker    Packs/day: 0.00    Years: 2.00    Pack years: 0.00    Types: Cigarettes    Last attempt to quit: 08/25/2012    Years since quitting: 4.3  . Smokeless tobacco: Never Used  Substance Use Topics  . Alcohol use: No  . Drug use: Yes    Types: Marijuana    Comment: Everyday  Unemployed   Allergies   Carbamazepine   Review of Systems Review of Systems  All other systems reviewed and are negative.    Physical Exam Updated Vital Signs BP 116/81 (BP Location: Right Arm)   Pulse (!) 109   Temp 98.4 F (36.9 C) (Oral)   Resp 15   Ht 5\' 6"  (1.676 m)   Wt 129.3 kg (285 lb)   LMP 12/29/2016   SpO2 99%   BMI 46.00 kg/m   Physical Exam  Constitutional: She is oriented to person, place, and time. She appears well-developed and well-nourished.  Non-toxic appearance. She does not appear ill. No distress.  Obese  HENT:  Head: Normocephalic and  atraumatic.  Right Ear: External ear normal.  Left Ear: External ear normal.  Nose: Nose normal. No mucosal edema or rhinorrhea.  Mouth/Throat: Oropharynx is clear and moist and mucous membranes are normal. No dental abscesses or uvula swelling.  Eyes: Conjunctivae and EOM are normal. Pupils are equal, round, and reactive to light.  Neck: Normal range of motion and full passive range of motion without pain. Neck supple.  Cardiovascular: Normal rate, regular rhythm and normal heart sounds. Exam reveals no gallop and no friction rub.  No murmur heard. Pulmonary/Chest: Effort normal. Tachypnea noted. No respiratory distress. She has decreased breath sounds. She has no wheezes. She has rhonchi. She has no rales. She exhibits no tenderness and no crepitus.  Patient has frequent coughing spasms.  Abdominal: Soft. Normal appearance and bowel sounds are normal. She exhibits no distension. There is no tenderness. There is no rebound and no guarding.  Musculoskeletal: Normal range of motion. She exhibits no edema or tenderness.  Moves all extremities well.   Neurological: She is alert and oriented to person, place, and time. She has normal strength. No cranial nerve deficit.  Skin: Skin is warm, dry and intact. No rash noted. No erythema. No pallor.  Psychiatric: She has a normal mood and affect. Her speech is normal and behavior is normal. Her mood appears not anxious.  Nursing note and vitals reviewed.    ED Treatments / Results  Labs (all labs ordered are listed, but only abnormal results are displayed) Results for orders placed or performed during the hospital encounter of 01/09/17  Rapid strep screen  Result Value Ref Range   Streptococcus, Group A Screen (Direct) NEGATIVE NEGATIVE   Laboratory interpretation all normal     EKG  EKG Interpretation None       Radiology Dg Chest 2 View  Result Date: 01/09/2017 CLINICAL DATA:  Acute onset of cough and sore throat. Difficulty  swallowing. EXAM: CHEST  2 VIEW COMPARISON:  Chest radiograph performed 08/20/2015 FINDINGS: The lungs are well-aerated and clear. There is no evidence of focal opacification, pleural effusion or pneumothorax. The heart is normal in size; the mediastinal contour is within normal limits. No acute osseous abnormalities are seen. IMPRESSION: No acute cardiopulmonary process seen. Electronically Signed   By: Roanna Raider M.D.   On: 01/09/2017 02:00    Procedures Procedures (including critical care time)  Medications Ordered in ED  Medications  albuterol (PROVENTIL HFA;VENTOLIN HFA) 108 (90 Base) MCG/ACT inhaler 2 puff (not administered)  aerochamber Z-Stat Plus/medium 1 each (not administered)  ipratropium-albuterol (DUONEB) 0.5-2.5 (3) MG/3ML nebulizer solution 3 mL (3 mLs Nebulization Given 01/09/17 0122)  albuterol (PROVENTIL) (2.5 MG/3ML) 0.083% nebulizer solution 2.5 mg (2.5 mg Nebulization Given 01/09/17 0122)     Initial Impression / Assessment and Plan / ED Course  I have reviewed the triage vital signs and the nursing notes.  Pertinent labs & imaging results that were available during my care of the patient were reviewed by me and considered in my medical decision making (see chart for details).     After observing the patient to have frequent coughing episodes and having very diminished breath sounds with rhonchi on her exam, she was given albuterol and Atrovent nebulizer treatment.  Chest x-ray was ordered.  Recheck at 3 AM we discussed her test results which were normal.  Patient states she feels much better after the nebulizer treatment.  Her lungs are now clear and patient is no longer having coughing at all little and coughing spasms.  She was given an albuterol inhaler and spacer and instructed on how to use it.  We discussed that she has a cold most likely viral, she can use the inhaler And take Mucinex DM over-the-counter for cough.  She should be rechecked if she gets a high  fever or she is not improving over the next week.  Final Clinical Impressions(s) / ED Diagnoses   Final diagnoses:  Bronchitis with bronchospasm  Viral upper respiratory tract infection    ED Discharge Orders    None    albuterol inhaler  Plan discharge  Devoria AlbeIva Swati Granberry, MD, [facility]    Crissy Mccreadie, MD 01/09/17 [phone]

## 2017-01-09 NOTE — ED Notes (Signed)
Pt alert & oriented x4, stable gait. Patient given discharge instructions, paperwork & prescription(s). Patient  instructed to stop at the registration desk to finish any additional paperwork. Patient verbalized understanding. Pt left department w/ no further questions. 

## 2017-01-09 NOTE — Discharge Instructions (Signed)
Drink plenty of fluids.  Use the inhaler 2 puffs every 4-6 hours as needed for wheezing and coughing.  Take Mucinex DM over-the-counter as needed for cough.  You can also use over-the-counter cough or sore throat drops.  Recheck if you get a high fever or if you are not improving over the next week.  Return to the ED if you get worse such as struggling to breathe.

## 2017-01-11 LAB — CULTURE, GROUP A STREP (THRC)

## 2018-03-29 IMAGING — DX DG CHEST 2V
2 series · 2 of 2 positions shown · non-contrast
Comparison: Chest radiograph performed 08/20/2015

CLINICAL DATA: Acute onset of cough and sore throat. Difficulty
swallowing.

EXAM:
CHEST  2 VIEW

[chest lat]
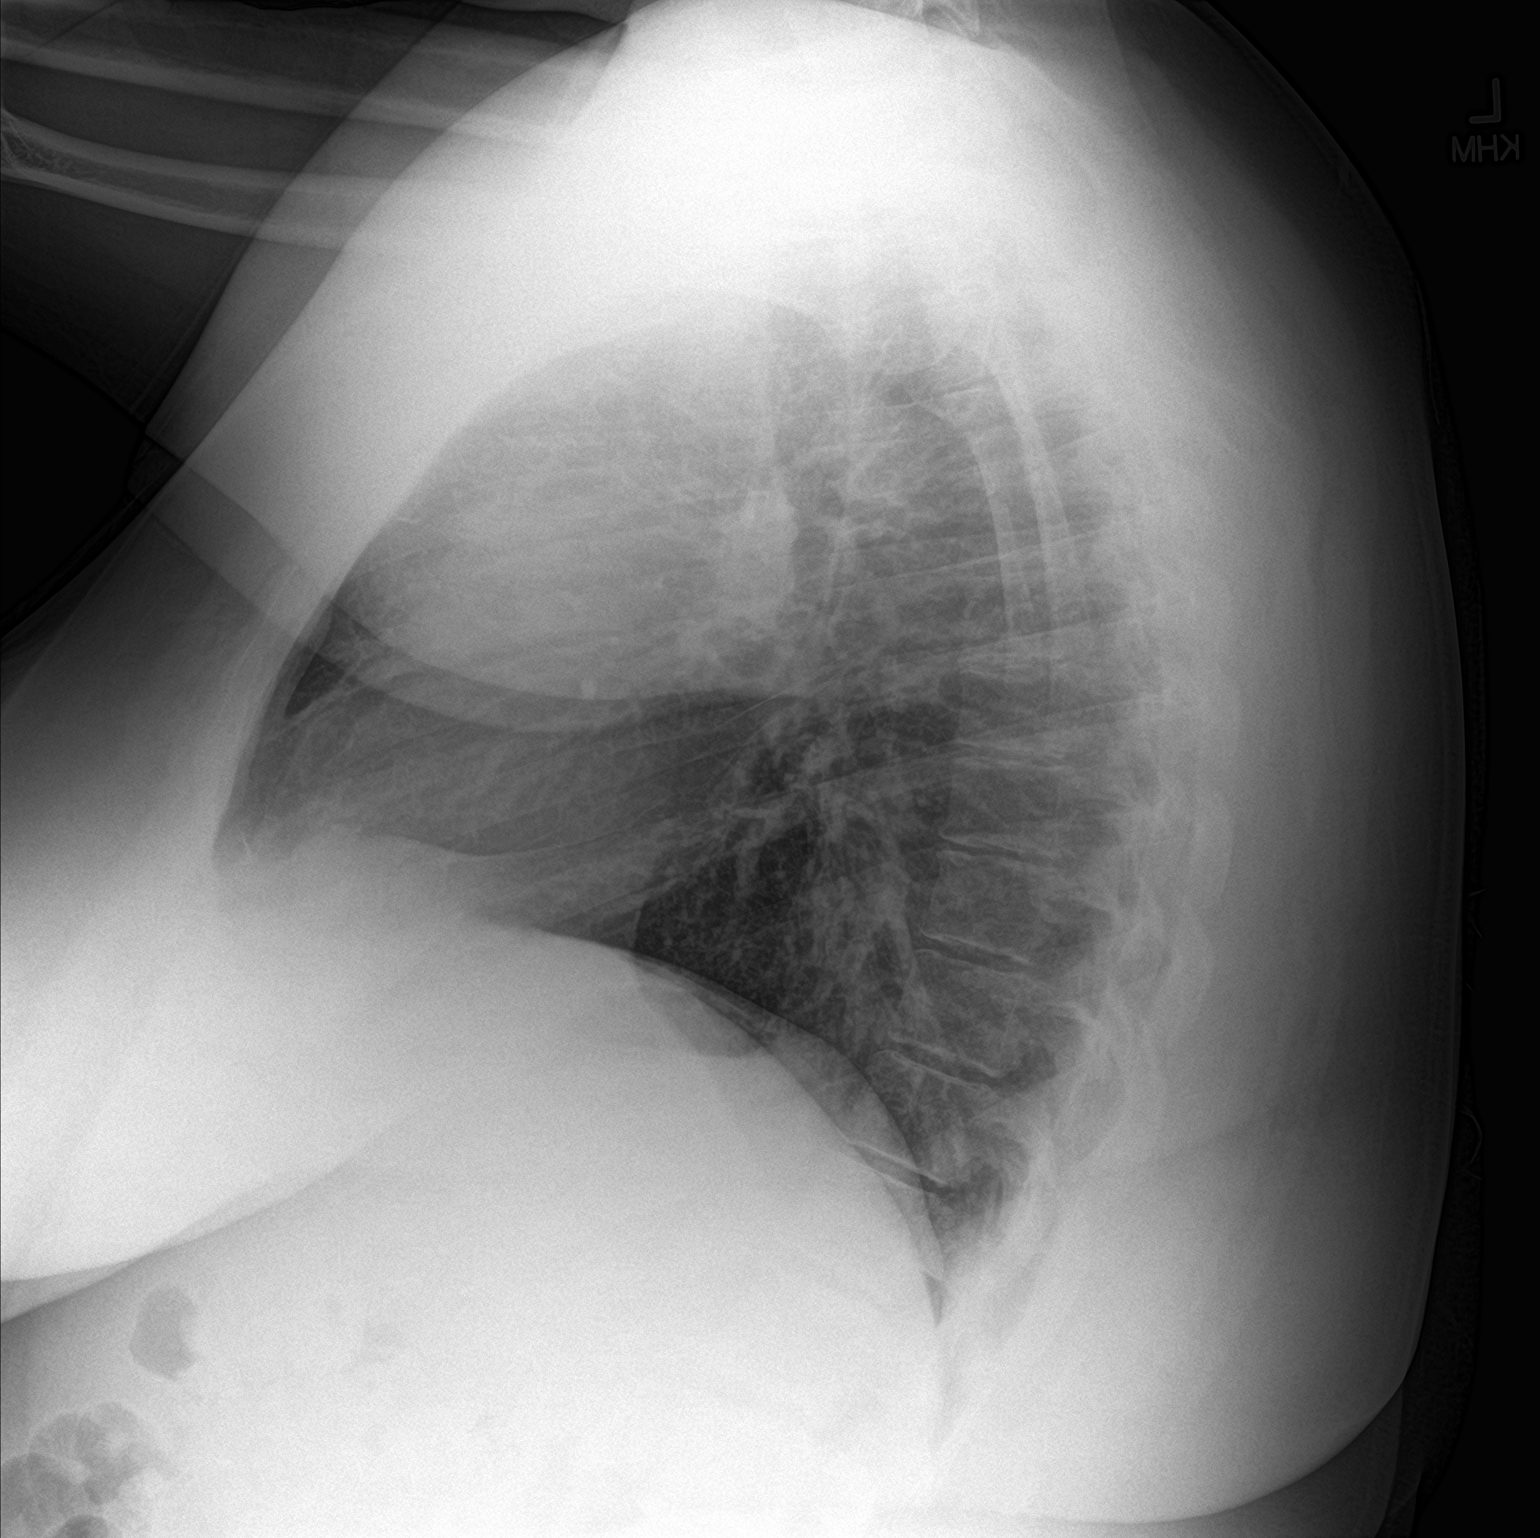

[chest pa]
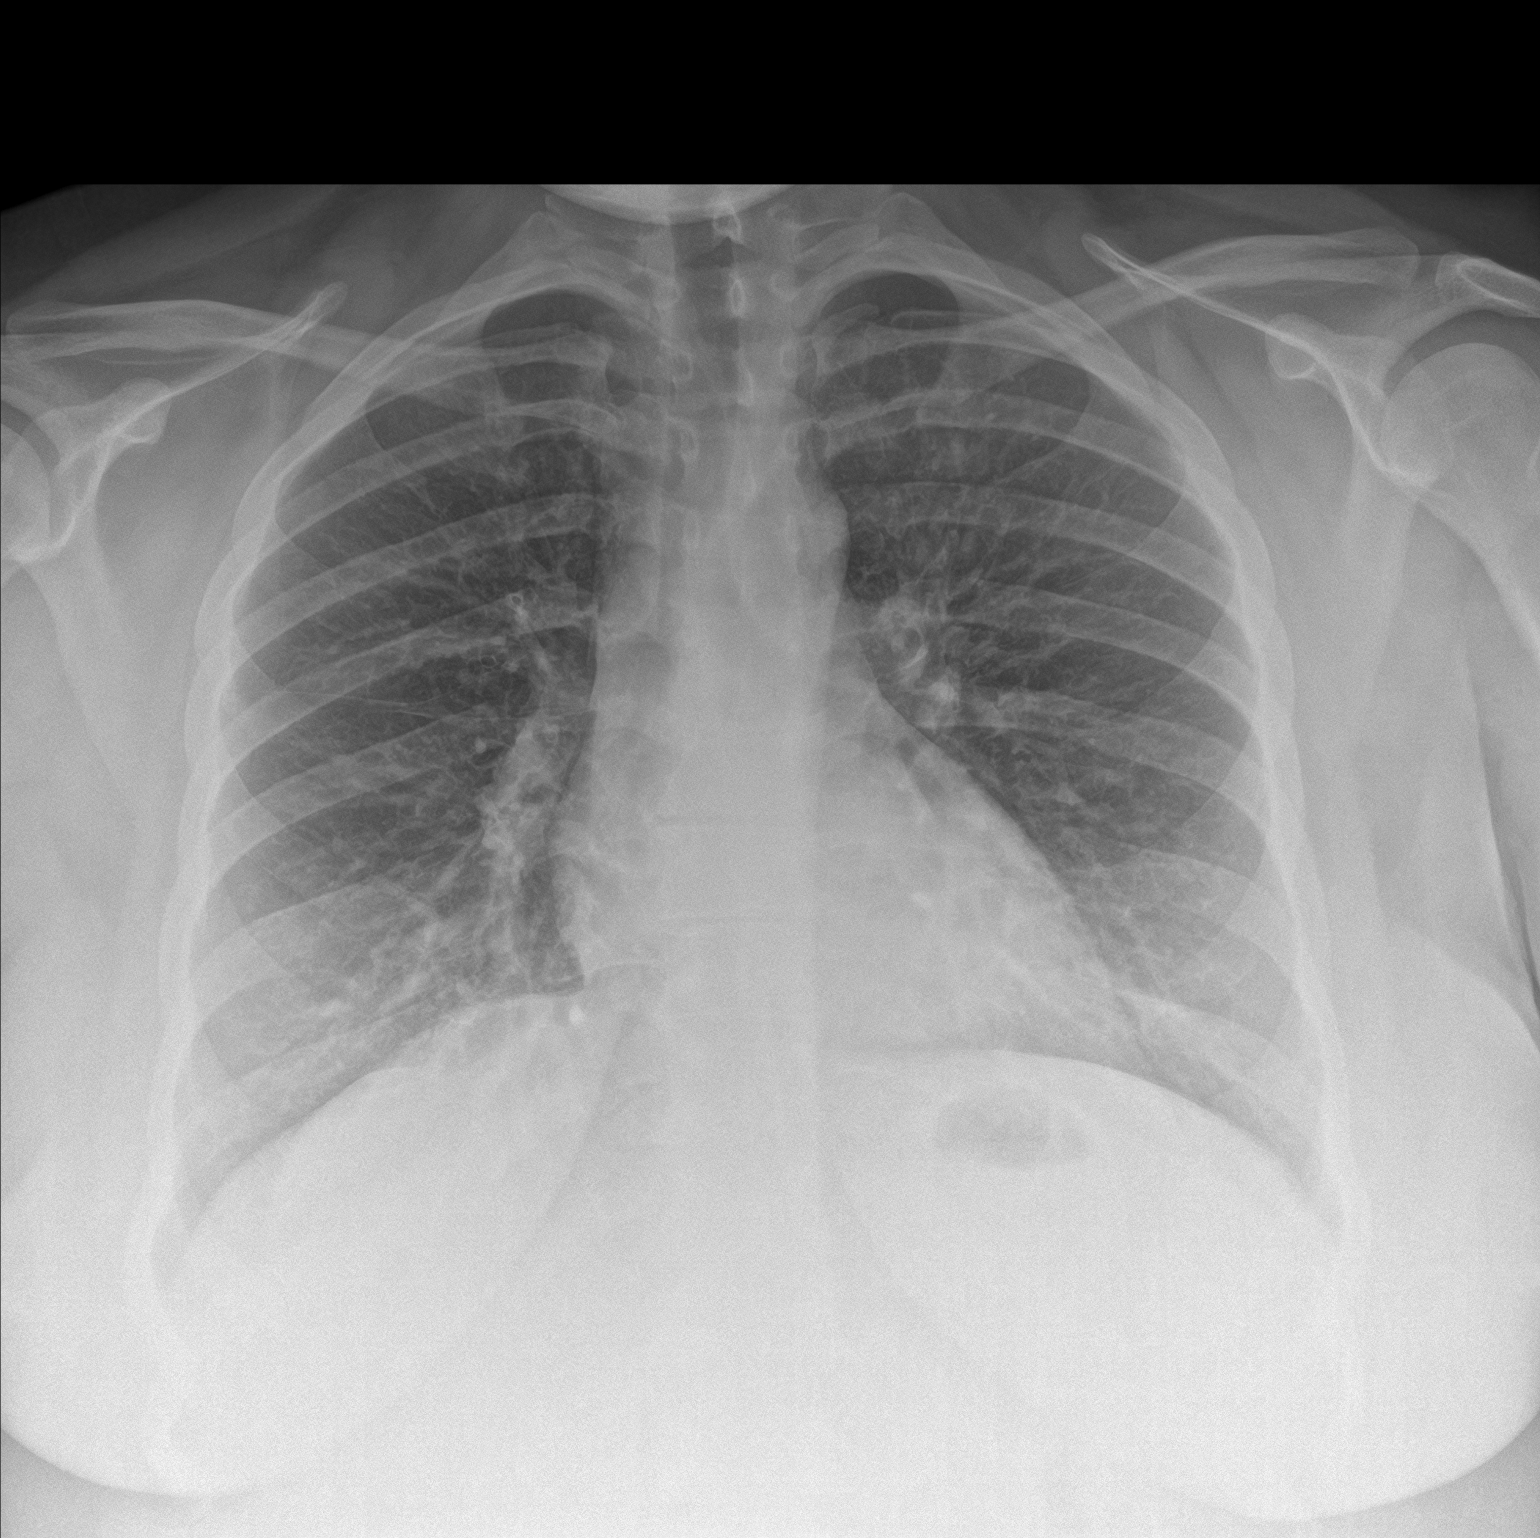

[2 of 2 positions shown; findings below may reference images not displayed]

FINDINGS: The lungs are well-aerated and clear. There is no evidence of focal
opacification, pleural effusion or pneumothorax.

The heart is normal in size; the mediastinal contour is within
normal limits. No acute osseous abnormalities are seen.
IMPRESSION: No acute cardiopulmonary process seen.

## 2018-06-05 ENCOUNTER — Emergency Department (HOSPITAL_COMMUNITY)
Admission: EM | Admit: 2018-06-05 | Discharge: 2018-06-05 | Disposition: A | Discharge status: Home / Self Care | Payer: Self-pay | Attending: Emergency Medicine | Admitting: Emergency Medicine

## 2018-06-05 ENCOUNTER — Encounter (HOSPITAL_COMMUNITY): Payer: Self-pay | Admitting: Emergency Medicine

## 2018-06-05 ENCOUNTER — Other Ambulatory Visit: Payer: Self-pay

## 2018-06-05 ENCOUNTER — Emergency Department (HOSPITAL_COMMUNITY): Payer: Self-pay

## 2018-06-05 DIAGNOSIS — Y999 Unspecified external cause status: Secondary | ICD-10-CM | POA: Insufficient documentation

## 2018-06-05 DIAGNOSIS — Y929 Unspecified place or not applicable: Secondary | ICD-10-CM | POA: Insufficient documentation

## 2018-06-05 DIAGNOSIS — W108XXA Fall (on) (from) other stairs and steps, initial encounter: Secondary | ICD-10-CM | POA: Insufficient documentation

## 2018-06-05 DIAGNOSIS — Z87891 Personal history of nicotine dependence: Secondary | ICD-10-CM | POA: Insufficient documentation

## 2018-06-05 DIAGNOSIS — S8391XA Sprain of unspecified site of right knee, initial encounter: Secondary | ICD-10-CM | POA: Insufficient documentation

## 2018-06-05 DIAGNOSIS — Y939 Activity, unspecified: Secondary | ICD-10-CM | POA: Insufficient documentation

## 2018-06-05 MED ORDER — IBUPROFEN 600 MG PO TABS: 600.0000 mg | ORAL_TABLET | Freq: Four times a day (QID) | ORAL | 0 refills | Status: DC | PRN

## 2018-06-05 NOTE — Discharge Instructions (Signed)
Return if any problems. See the Orthopaedist for recheck if pain persist  

## 2018-06-05 NOTE — ED Triage Notes (Signed)
Patient reports falling down steps yesterday. C/O R medial knee pain, unable to bear weight.

## 2018-06-17 NOTE — ED Provider Notes (Signed)
[facility] EMERGENCY DEPARTMENT Provider Note   CSN: [id number] Arrival date & time: 06/05/18  1300    History   Chief Complaint Chief Complaint  Patient presents with  . Fall    HPI UNICA KRENZEL is a 28 y.o. female.     The history is provided by the patient. No language interpreter was used.  Fall  This is a new problem. The current episode started yesterday. The problem occurs constantly. The problem has been gradually worsening. The symptoms are aggravated by walking. Nothing relieves the symptoms.  Pt complains of right knee pain since falling down steps yesterday  Past Medical History:  Diagnosis Date  . Bipolar 1 disorder (HCC)   . Infected tooth   . Obesity   . PCOS (polycystic ovarian syndrome)    "When I was young". No medical intervention.    Patient Active Problem List   Diagnosis Date Noted  . Gonorrhea & Chlamydia 03/05/2016  . Bipolar disorder (HCC) 02/27/2016  . History of PCOS 02/27/2016  . Morbid obesity (HCC)   . Abscess of breast 12/10/2012  . Sebaceous cyst 12/10/2012    Past Surgical History:  Procedure Laterality Date  . DILATION AND CURETTAGE OF UTERUS N/A 09/03/2012   Procedure: SUCTION DILATATION AND CURETTAGE;  Surgeon: Lazaro Arms, MD;  Location: [facility];  Service: Gynecology;  Laterality: N/A;  . TONSILLECTOMY       OB History    Gravida  3   Para  1   Term  1   Preterm      AB  2   Living  1     SAB  2   TAB      Ectopic      Multiple      Live Births  1            Home Medications    Prior to Admission medications   Medication Sig Start Date End Date Taking? Authorizing Provider  albuterol (PROVENTIL HFA;VENTOLIN HFA) 108 (90 Base) MCG/ACT inhaler Inhale 1-2 puffs into the lungs every 6 (six) hours as needed for wheezing or shortness of breath.    [provider]  etonogestrel-ethinyl estradiol (NUVARING) 0.12-0.015 MG/24HR vaginal ring Insert vaginally and leave in place for 3  consecutive weeks, then remove for 1 week. 10/22/16   Cheral Marker, CNM  ibuprofen (ADVIL) 600 MG tablet Take 1 tablet (600 mg total) by mouth every 6 (six) hours as needed. 06/05/18   Elson Areas, PA-C  metroNIDAZOLE (FLAGYL) 500 MG tablet Take 1 tablet (500 mg total) by mouth 2 (two) times daily. X 7 days. No sex or alcohol while taking 10/22/16   Cheral Marker, CNM  sulfamethoxazole-trimethoprim (BACTRIM DS,SEPTRA DS) 800-160 MG tablet Take 1 tablet by mouth 2 (two) times daily. X 7 days 10/22/16   Cheral Marker, CNM    Family History Family History  Problem Relation Age of Onset  . Diabetes Paternal Grandmother   . Diabetes Paternal Grandfather   . Cancer Mother        cervicle  . Cancer Paternal Uncle        lung  . Cancer Maternal Grandmother        lung  . Cancer Maternal Grandfather        throat    Social History Social History   Tobacco Use  . Smoking status: Former Smoker    Packs/day: 0.00    Years: 2.00    Pack  years: 0.00    Types: Cigarettes    Last attempt to quit: 08/25/2012    Years since quitting: 5.8  . Smokeless tobacco: Never Used  Substance Use Topics  . Alcohol use: Yes    Comment: occas  . Drug use: Yes    Types: Marijuana    Comment: Everyday     Allergies   Carbamazepine   Review of Systems Review of Systems  All other systems reviewed and are negative.    Physical Exam Updated Vital Signs BP 115/64 (BP Location: Left Arm)   Pulse 92   Temp 98 F (36.7 C) (Oral)   Resp 17   Ht 5\' 6"  (1.676 m)   Wt 133.8 kg   SpO2 100%   BMI 47.61 kg/m   Physical Exam Vitals signs reviewed.  Cardiovascular:     Rate and Rhythm: Normal rate.     Pulses: Normal pulses.  Pulmonary:     Effort: Pulmonary effort is normal.  Abdominal:     General: Abdomen is flat.  Musculoskeletal:        General: Swelling, tenderness and signs of injury present.  Skin:    General: Skin is warm.  Neurological:     General: No focal  deficit present.     Mental Status: She is alert.  Psychiatric:        Mood and Affect: Mood normal.      ED Treatments / Results  Labs (all labs ordered are listed, but only abnormal results are displayed) Labs Reviewed - No data to display  EKG None  Radiology No results found.  Procedures Procedures (including critical care time)  Medications Ordered in ED Medications - No data to display   Initial Impression / Assessment and Plan / ED Course  I have reviewed the triage vital signs and the nursing notes.  Pertinent labs & imaging results that were available during my care of the patient were reviewed by me and considered in my medical decision making (see chart for details).        MDM  xrays discussed with pt.  Pt placed in a knee immbolizer.  Pt advised to follow up with Orthopaedist   Final Clinical Impressions(s) / ED Diagnoses   Final diagnoses:  Sprain of right knee, unspecified ligament, initial encounter    ED Discharge Orders         Ordered    ibuprofen (ADVIL) 600 MG tablet  Every 6 hours PRN     06/05/18 1405        An After Visit Summary was printed and given to the patient.    Elson AreasSofia, Maricruz Lucero K, [place]PA-C 06/17/18 1702    Maia PlanLong, Joshua G, MD 06/18/18 1008

## 2018-06-20 ENCOUNTER — Encounter (HOSPITAL_COMMUNITY): Payer: Self-pay | Admitting: Emergency Medicine

## 2018-06-20 ENCOUNTER — Other Ambulatory Visit: Payer: Self-pay

## 2018-06-20 ENCOUNTER — Emergency Department (HOSPITAL_COMMUNITY)
Admission: EM | Admit: 2018-06-20 | Discharge: 2018-06-20 | Disposition: A | Discharge status: Home / Self Care | Payer: Self-pay | Attending: Emergency Medicine | Admitting: Emergency Medicine

## 2018-06-20 DIAGNOSIS — S6991XA Unspecified injury of right wrist, hand and finger(s), initial encounter: Secondary | ICD-10-CM | POA: Insufficient documentation

## 2018-06-20 DIAGNOSIS — Y9389 Activity, other specified: Secondary | ICD-10-CM | POA: Insufficient documentation

## 2018-06-20 DIAGNOSIS — S60459A Superficial foreign body of unspecified finger, initial encounter: Secondary | ICD-10-CM | POA: Insufficient documentation

## 2018-06-20 DIAGNOSIS — Z87891 Personal history of nicotine dependence: Secondary | ICD-10-CM | POA: Insufficient documentation

## 2018-06-20 DIAGNOSIS — Z23 Encounter for immunization: Secondary | ICD-10-CM | POA: Insufficient documentation

## 2018-06-20 DIAGNOSIS — F1210 Cannabis abuse, uncomplicated: Secondary | ICD-10-CM | POA: Insufficient documentation

## 2018-06-20 DIAGNOSIS — Y998 Other external cause status: Secondary | ICD-10-CM | POA: Insufficient documentation

## 2018-06-20 DIAGNOSIS — Y9289 Other specified places as the place of occurrence of the external cause: Secondary | ICD-10-CM | POA: Insufficient documentation

## 2018-06-20 DIAGNOSIS — W268XXA Contact with other sharp object(s), not elsewhere classified, initial encounter: Secondary | ICD-10-CM | POA: Insufficient documentation

## 2018-06-20 MED ORDER — TETANUS-DIPHTH-ACELL PERTUSSIS 5-2.5-18.5 LF-MCG/0.5 IM SUSP
0.5000 mL | Freq: Once | INTRAMUSCULAR | Status: AC
  Administered 2018-06-20: 16:00:00 0.5 mL via INTRAMUSCULAR

## 2018-06-20 MED ORDER — CEPHALEXIN 500 MG PO CAPS: 500.0000 mg | ORAL_CAPSULE | Freq: Four times a day (QID) | ORAL | 0 refills | Status: DC

## 2018-06-20 MED ORDER — IBUPROFEN 600 MG PO TABS: 600.0000 mg | ORAL_TABLET | Freq: Four times a day (QID) | ORAL | 0 refills | Status: DC | PRN

## 2018-06-20 MED ORDER — POVIDONE-IODINE 10 % EX SOLN
CUTANEOUS | Status: AC
  Filled 2018-06-20: qty 15

## 2018-06-20 MED ORDER — TETANUS-DIPHTH-ACELL PERTUSSIS 5-2.5-18.5 LF-MCG/0.5 IM SUSP
INTRAMUSCULAR | Status: AC
  Filled 2018-06-20: qty 0.5

## 2018-06-20 MED ORDER — LIDOCAINE HCL (PF) 1 % IJ SOLN
INTRAMUSCULAR | Status: AC
  Filled 2018-06-20: qty 5

## 2018-06-20 NOTE — ED Notes (Signed)
Fish hook caught in the palmar surface of the R middle finger.

## 2018-06-20 NOTE — ED Triage Notes (Signed)
Pt states that she is fishing hook in her finger

## 2018-06-20 NOTE — Discharge Instructions (Addendum)
Take your entire course of the antibiotics prescribed.  Wash your finger twice daily in warm soapy water and keep covered and clean.

## 2018-06-21 NOTE — ED Provider Notes (Signed)
[facility] EMERGENCY DEPARTMENT Provider Note   CSN: [id number] Arrival date & time: 06/20/18  1600    History   Chief Complaint Chief Complaint  Patient presents with  . foreign body in finger    HPI Sara Carpenter is a 28 y.o. female who tripped when caring a fishing pole (hadn't started fishing yet) and caught the fish hook in her right ring finger.  Injury occurred just prior to arrival.  She has been unable to remove the hook.  Denies any other injury. Not current with tetanus vaccine.     The history is provided by the patient.    Past Medical History:  Diagnosis Date  . Bipolar 1 disorder (HCC)   . Infected tooth   . Obesity   . PCOS (polycystic ovarian syndrome)    "When I was young". No medical intervention.    Patient Active Problem List   Diagnosis Date Noted  . Gonorrhea & Chlamydia 03/05/2016  . Bipolar disorder (HCC) 02/27/2016  . History of PCOS 02/27/2016  . Morbid obesity (HCC)   . Abscess of breast 12/10/2012  . Sebaceous cyst 12/10/2012    Past Surgical History:  Procedure Laterality Date  . DILATION AND CURETTAGE OF UTERUS N/A 09/03/2012   Procedure: SUCTION DILATATION AND CURETTAGE;  Surgeon: Lazaro ArmsLuther H Eure, MD;  Location: [facility];  Service: Gynecology;  Laterality: N/A;  . TONSILLECTOMY       OB History    Gravida  3   Para  1   Term  1   Preterm      AB  2   Living  1     SAB  2   TAB      Ectopic      Multiple      Live Births  1            Home Medications    Prior to Admission medications   Medication Sig Start Date End Date Taking? Authorizing Provider  albuterol (PROVENTIL HFA;VENTOLIN HFA) 108 (90 Base) MCG/ACT inhaler Inhale 1-2 puffs into the lungs every 6 (six) hours as needed for wheezing or shortness of breath.    [provider]  cephALEXin (KEFLEX) 500 MG capsule Take 1 capsule (500 mg total) by mouth 4 (four) times daily. 06/20/18   Burgess AmorIdol, Cicely Ortner, PA-C  etonogestrel-ethinyl estradiol  (NUVARING) 0.12-0.015 MG/24HR vaginal ring Insert vaginally and leave in place for 3 consecutive weeks, then remove for 1 week. 10/22/16   Cheral MarkerBooker, Kimberly R, CNM  ibuprofen (ADVIL) 600 MG tablet Take 1 tablet (600 mg total) by mouth every 6 (six) hours as needed. 06/20/18   Burgess AmorIdol, Durwood Dittus, PA-C  metroNIDAZOLE (FLAGYL) 500 MG tablet Take 1 tablet (500 mg total) by mouth 2 (two) times daily. X 7 days. No sex or alcohol while taking 10/22/16   Cheral MarkerBooker, Kimberly R, CNM  sulfamethoxazole-trimethoprim (BACTRIM DS,SEPTRA DS) 800-160 MG tablet Take 1 tablet by mouth 2 (two) times daily. X 7 days 10/22/16   Cheral MarkerBooker, Kimberly R, CNM    Family History Family History  Problem Relation Age of Onset  . Diabetes Paternal Grandmother   . Diabetes Paternal Grandfather   . Cancer Mother        cervicle  . Cancer Paternal Uncle        lung  . Cancer Maternal Grandmother        lung  . Cancer Maternal Grandfather        throat    Social History Social  History   Tobacco Use  . Smoking status: Former Smoker    Packs/day: 0.00    Years: 2.00    Pack years: 0.00    Types: Cigarettes    Last attempt to quit: 08/25/2012    Years since quitting: 5.8  . Smokeless tobacco: Never Used  Substance Use Topics  . Alcohol use: Yes    Comment: occas  . Drug use: Yes    Types: Marijuana    Comment: Everyday     Allergies   Carbamazepine   Review of Systems Review of Systems  Constitutional: Negative for fever.  Musculoskeletal: Negative for arthralgias and joint swelling.  Skin: Positive for wound.  Neurological: Negative for weakness and numbness.     Physical Exam Updated Vital Signs BP (!) 110/91 (BP Location: Right Arm)   Pulse 81   Temp 98.6 F (37 C) (Oral)   Resp 18   Ht 5\' 6"  (1.676 m)   Wt 129.3 kg   LMP 05/30/2018   SpO2 100%   BMI 46.00 kg/m   Physical Exam Constitutional:      Appearance: She is well-developed.  HENT:     Head: Normocephalic.  Cardiovascular:     Rate and  Rhythm: Normal rate.  Pulmonary:     Effort: Pulmonary effort is normal.  Musculoskeletal:        General: Tenderness present.  Skin:    General: Skin is warm.     Capillary Refill: Capillary refill takes less than 2 seconds.     Comments: Small embedded fishing hook at the radial middle phalanx of the right ring finger.  Distal sensation intact.  Neurological:     Mental Status: She is alert and oriented to person, place, and time.     Sensory: No sensory deficit.      ED Treatments / Results  Labs (all labs ordered are listed, but only abnormal results are displayed) Labs Reviewed - No data to display  EKG None  Radiology No results found.  Procedures .Foreign Body Removal Date/Time: 06/20/2018 5:30 PM Performed by: Burgess AmorIdol, Samona Chihuahua, PA-C Authorized by: Burgess AmorIdol, Andreyah Natividad, PA-C  Consent: Verbal consent obtained. Risks and benefits: risks, benefits and alternatives were discussed Consent given by: patient Patient understanding: patient states understanding of the procedure being performed Patient identity confirmed: verbally with patient Time out: Immediately prior to procedure a "time out" was called to verify the correct patient, procedure, equipment, support staff and site/side marked as required. Body area: skin General location: upper extremity Location details: right ring finger Anesthesia: digital block  Anesthesia: Local Anesthetic: lidocaine 1% without epinephrine Anesthetic total: 3 mL Localization method: visualized Removal mechanism: scalpel (attempt to back out the hook using an 18 gauge needle to cover the hook. unsuccessful.  Small incision made at the site of puncture and the hook easily retrieved.) Dressing: dressing applied Tendon involvement: none Depth: subcutaneous Complexity: simple 1 objects recovered. Objects recovered: fishing hook Post-procedure assessment: foreign body removed   (including critical care time)  Medications Ordered in ED  Medications  Tdap (BOOSTRIX) injection 0.5 mL (0.5 mLs Intramuscular Given 06/20/18 1622)     Initial Impression / Assessment and Plan / ED Course  I have reviewed the triage vital signs and the nursing notes.  Pertinent labs & imaging results that were available during my care of the patient were reviewed by me and considered in my medical decision making (see chart for details).        Wound care instructions given, tetanus  updated.  Pt was covered with keflex (no fresh water exposure). Return precautions discussed.  Final Clinical Impressions(s) / ED Diagnoses   Final diagnoses:  Foreign body in skin of finger, initial encounter  Fish hook injury of finger of right hand, initial encounter    ED Discharge Orders         Ordered    cephALEXin (KEFLEX) 500 MG capsule  4 times daily,   Status:  Discontinued     06/20/18 1758    ibuprofen (ADVIL) 600 MG tablet  Every 6 hours PRN,   Status:  Discontinued     06/20/18 1758    cephALEXin (KEFLEX) 500 MG capsule  4 times daily     06/20/18 1803    ibuprofen (ADVIL) 600 MG tablet  Every 6 hours PRN     06/20/18 1803           Evalee Jefferson, PA-C 06/21/18 1221    Veryl Speak, MD 06/24/18 2342

## 2018-10-04 ENCOUNTER — Other Ambulatory Visit: Payer: Self-pay

## 2018-10-04 ENCOUNTER — Encounter (HOSPITAL_COMMUNITY): Payer: Self-pay | Admitting: Emergency Medicine

## 2018-10-04 ENCOUNTER — Emergency Department (HOSPITAL_COMMUNITY)
Admission: EM | Admit: 2018-10-04 | Discharge: 2018-10-04 | Disposition: A | Discharge status: Home / Self Care | Payer: Self-pay | Attending: Emergency Medicine | Admitting: Emergency Medicine

## 2018-10-04 DIAGNOSIS — Z5321 Procedure and treatment not carried out due to patient leaving prior to being seen by health care provider: Secondary | ICD-10-CM | POA: Insufficient documentation

## 2018-10-04 DIAGNOSIS — M791 Myalgia, unspecified site: Secondary | ICD-10-CM | POA: Insufficient documentation

## 2018-10-04 NOTE — ED Triage Notes (Signed)
Headache, body aches, sore throat and poss fever for past 3 days.  Pt did not check her temp, states she was burning up and had chills.  Has not taken anything for fever today.

## 2019-02-16 ENCOUNTER — Other Ambulatory Visit: Payer: Self-pay

## 2019-02-16 ENCOUNTER — Encounter (HOSPITAL_COMMUNITY): Payer: Self-pay | Admitting: Emergency Medicine

## 2019-02-16 ENCOUNTER — Inpatient Hospital Stay (HOSPITAL_COMMUNITY)
Admission: AD | Admit: 2019-02-16 | Discharge: 2019-02-16 | Disposition: A | Discharge status: Home / Self Care | Payer: BLUE CROSS/BLUE SHIELD | Attending: Obstetrics and Gynecology | Admitting: Obstetrics and Gynecology

## 2019-02-16 DIAGNOSIS — Z3A16 16 weeks gestation of pregnancy: Secondary | ICD-10-CM | POA: Diagnosis not present

## 2019-02-16 DIAGNOSIS — R519 Headache, unspecified: Secondary | ICD-10-CM | POA: Insufficient documentation

## 2019-02-16 DIAGNOSIS — Z87891 Personal history of nicotine dependence: Secondary | ICD-10-CM | POA: Insufficient documentation

## 2019-02-16 DIAGNOSIS — R42 Dizziness and giddiness: Secondary | ICD-10-CM | POA: Insufficient documentation

## 2019-02-16 DIAGNOSIS — R109 Unspecified abdominal pain: Secondary | ICD-10-CM | POA: Insufficient documentation

## 2019-02-16 DIAGNOSIS — R102 Pelvic and perineal pain: Secondary | ICD-10-CM

## 2019-02-16 DIAGNOSIS — R002 Palpitations: Secondary | ICD-10-CM

## 2019-02-16 DIAGNOSIS — O26892 Other specified pregnancy related conditions, second trimester: Secondary | ICD-10-CM | POA: Diagnosis not present

## 2019-02-16 DIAGNOSIS — Z3490 Encounter for supervision of normal pregnancy, unspecified, unspecified trimester: Secondary | ICD-10-CM

## 2019-02-16 HISTORY — DX: Depression, unspecified: F32.A

## 2019-02-16 HISTORY — DX: Anxiety disorder, unspecified: F41.9

## 2019-02-16 HISTORY — DX: Unspecified infectious disease: B99.9

## 2019-02-16 HISTORY — DX: Unspecified abnormal cytological findings in specimens from vagina: R87.629

## 2019-02-16 LAB — CBC WITH DIFFERENTIAL/PLATELET
Abs Immature Granulocytes: 0.09 10*3/uL — ABNORMAL HIGH (ref 0.00–0.07)
Basophils Absolute: 0.1 10*3/uL (ref 0.0–0.1)
Basophils Relative: 1 %
Eosinophils Absolute: 0.3 10*3/uL (ref 0.0–0.5)
Eosinophils Relative: 2 %
HCT: 39.5 % (ref 36.0–46.0)
Hemoglobin: 13.2 g/dL (ref 12.0–15.0)
Immature Granulocytes: 1 %
Lymphocytes Relative: 22 %
Lymphs Abs: 3.2 10*3/uL (ref 0.7–4.0)
MCH: 30.3 pg (ref 26.0–34.0)
MCHC: 33.4 g/dL (ref 30.0–36.0)
MCV: 90.8 fL (ref 80.0–100.0)
Monocytes Absolute: 0.8 10*3/uL (ref 0.1–1.0)
Monocytes Relative: 5 %
Neutro Abs: 10.3 10*3/uL — ABNORMAL HIGH (ref 1.7–7.7)
Neutrophils Relative %: 69 %
Platelets: 392 10*3/uL (ref 150–400)
RBC: 4.35 MIL/uL (ref 3.87–5.11)
RDW: 12.7 % (ref 11.5–15.5)
WBC: 14.8 10*3/uL — ABNORMAL HIGH (ref 4.0–10.5)
nRBC: 0 % (ref 0.0–0.2)

## 2019-02-16 LAB — URINALYSIS, ROUTINE W REFLEX MICROSCOPIC
Bilirubin Urine: NEGATIVE
Glucose, UA: NEGATIVE mg/dL
Hgb urine dipstick: NEGATIVE
Ketones, ur: 20 mg/dL — AB
Leukocytes,Ua: NEGATIVE
Nitrite: NEGATIVE
Protein, ur: NEGATIVE mg/dL
Specific Gravity, Urine: 1.023 (ref 1.005–1.030)
pH: 6 (ref 5.0–8.0)

## 2019-02-16 LAB — BASIC METABOLIC PANEL
Anion gap: 10 (ref 5–15)
BUN: 5 mg/dL — ABNORMAL LOW (ref 6–20)
CO2: 20 mmol/L — ABNORMAL LOW (ref 22–32)
Calcium: 9.3 mg/dL (ref 8.9–10.3)
Chloride: 104 mmol/L (ref 98–111)
Creatinine, Ser: 0.7 mg/dL (ref 0.44–1.00)
GFR calc Af Amer: >60 mL/min (ref >60)
GFR calc non Af Amer: >60 mL/min (ref >60)
Glucose, Bld: 91 mg/dL (ref 70–99)
Potassium: 3.8 mmol/L (ref 3.5–5.1)
Sodium: 134 mmol/L — ABNORMAL LOW (ref 135–145)

## 2019-02-16 NOTE — MAU Provider Note (Signed)
History     CSN: [id number]  Arrival date and time: 02/16/19 1436   First Provider Initiated Contact with Patient 02/16/19 1819      Chief Complaint  Patient presents with  . Dizziness  . Abdominal Pain  . Headache   HPI Sara Carpenter is a 29 y.o. [id number] at [redacted]w[redacted]d who presents to MAU from [facility] for evaluation of right mid-abdominal cramping. This is a recurrent problem, onset with pregnancy.  Patient endorses normal IUP on ultrasound at [facility] in early January. Her pain is right mid-abdomen, non-radiating, and rated as 3/10. She denies aggravating or alleviating factors. She denies dysuria, TTP, vaginal bleeding, abnormal vaginal discharge, fever or recent illness. Patient states she works at [employer] and has a two hour commute. She denies physically strenuous activity.  Patient also reports headache, onset with pregnancy, which she rates as 5/10. She is managing her pain with Tylenol, which provides a brief period of relief then the headache returns.   OB History    Gravida  4   Para  1   Term  1   Preterm      AB  2   Living  1     SAB  2   TAB      Ectopic      Multiple      Live Births  1           Past Medical History:  Diagnosis Date  . Anxiety   . Bipolar 1 disorder (HCC)   . Depression    is fine  . Infected tooth   . Infection    UTI  . Obesity   . PCOS (polycystic ovarian syndrome)    "When I was young". No medical intervention.  . Vaginal Pap smear, abnormal    did not f/u    Past Surgical History:  Procedure Laterality Date  . DILATION AND CURETTAGE OF UTERUS N/A 09/03/2012   Procedure: SUCTION DILATATION AND CURETTAGE;  Surgeon: Lazaro Arms, MD;  Location: [facility];  Service: Gynecology;  Laterality: N/A;  . TONSILLECTOMY      Family History  Problem Relation Age of Onset  . Diabetes Paternal Grandmother   . Diabetes Paternal Grandfather   . Cancer Mother        cervicle  . Cancer Paternal Uncle        lung  . Cancer  Maternal Grandmother        lung  . Cancer Maternal Grandfather        throat  . Healthy Father     Social History   Tobacco Use  . Smoking status: Former Smoker    Packs/day: 0.00    Years: 2.00    Pack years: 0.00    Types: Cigarettes    Quit date: 08/25/2012    Years since quitting: 6.4  . Smokeless tobacco: Never Used  Substance Use Topics  . Alcohol use: Not Currently    Comment: occas  . Drug use: Yes    Types: Marijuana    Comment: Everyday    Allergies:  Allergies  Allergen Reactions  . Carbamazepine Nausea And Vomiting    Medications Prior to Admission  Medication Sig Dispense Refill Last Dose  . albuterol (PROVENTIL HFA;VENTOLIN HFA) 108 (90 Base) MCG/ACT inhaler Inhale 1-2 puffs into the lungs every 6 (six) hours as needed for wheezing or shortness of breath.     . cephALEXin (KEFLEX) 500 MG capsule Take 1 capsule (500 mg  total) by mouth 4 (four) times daily. 28 capsule 0   . etonogestrel-ethinyl estradiol (NUVARING) 0.12-0.015 MG/24HR vaginal ring Insert vaginally and leave in place for 3 consecutive weeks, then remove for 1 week. 1 each 12   . ibuprofen (ADVIL) 600 MG tablet Take 1 tablet (600 mg total) by mouth every 6 (six) hours as needed. 30 tablet 0   . metroNIDAZOLE (FLAGYL) 500 MG tablet Take 1 tablet (500 mg total) by mouth 2 (two) times daily. X 7 days. No sex or alcohol while taking 14 tablet 0   . sulfamethoxazole-trimethoprim (BACTRIM DS,SEPTRA DS) 800-160 MG tablet Take 1 tablet by mouth 2 (two) times daily. X 7 days 14 tablet 0     Review of Systems  Constitutional: Negative for chills, fatigue and fever.  Respiratory: Negative for shortness of breath.   Gastrointestinal: Positive for abdominal pain. Negative for nausea and vomiting.  Genitourinary: Negative for difficulty urinating, dysuria, vaginal bleeding, vaginal discharge and vaginal pain.  Musculoskeletal: Negative for back pain.  Neurological: Positive for headaches. Negative for  dizziness, syncope and weakness.  All other systems reviewed and are negative.  Physical Exam   Blood pressure 127/88, pulse 98, temperature 99 F (37.2 C), resp. rate 18, height 5\' 7"  (1.702 m), weight (!) 145.2 kg, last menstrual period 10/21/2018, SpO2 99 %, unknown if currently breastfeeding.  Physical Exam  Nursing note and vitals reviewed. Constitutional: She is oriented to person, place, and time. She appears well-developed and well-nourished.  Cardiovascular: Normal rate.  Respiratory: Effort normal and breath sounds normal.  GI: Soft. She exhibits no distension. There is no abdominal tenderness. There is no rigidity, no rebound, no guarding, no CVA tenderness, no tenderness at McBurney's point and negative Murphy's sign.  Neurological: She is alert and oriented to person, place, and time.  Skin: Skin is warm and dry.  Psychiatric: She has a normal mood and affect. Her behavior is normal. Judgment and thought content normal.    MAU Course/MDM  Procedures  --S/p normal BMET and EKG in [facility]. See separate note from [facility] Provider --Active fetal movement and FHT 150 visualized on BSUS --Patient's prenatal care plans interrupted by insurance problems and unexpected out of pocket costs. Givne list of prenatal care providers in [place] --Elevated WBCs and mid abdominal cramping, onset with pregnancy. Abdomen is non-tender, afebrile, no dysuria, clean UA. Discussed with Dr. Roselie Awkward, no further workup indicated at this time --Patient declines offer of Tylenol in MAU  Patient Vitals for the past 24 hrs:  BP Temp Temp src Pulse Resp SpO2 Height Weight  02/16/19 1913 122/81 -- -- (!) 105 17 -- -- --  02/16/19 1752 127/88 -- -- 98 18 99 % 5\' 7"  (1.702 m) (!) 145.2 kg  02/16/19 1750 -- -- -- -- -- 100 % -- --  02/16/19 1500 116/71 99 F (37.2 C) -- 97 17 95 % -- --  02/16/19 1454 116/71 99 F (37.2 C) Oral 97 17 94 % 5\' 7"  (1.702 m) (!) 143.3 kg   Results for orders placed or  performed during the hospital encounter of 02/16/19 (from the past 24 hour(s))  Basic metabolic panel     Status: Abnormal   Collection Time: 02/16/19  3:30 PM  Result Value Ref Range   Sodium 134 (L) 135 - 145 mmol/L   Potassium 3.8 3.5 - 5.1 mmol/L   Chloride 104 98 - 111 mmol/L   CO2 20 (L) 22 - 32 mmol/L   Glucose, Bld 91 70 -  99 mg/dL   BUN 5 (L) 6 - 20 mg/dL   Creatinine, Ser 6.64 0.44 - 1.00 mg/dL   Calcium 9.3 8.9 - 40.3 mg/dL   GFR calc non Af Amer >60 >60 mL/min   GFR calc Af Amer >60 >60 mL/min   Anion gap 10 5 - 15  CBC with Differential     Status: Abnormal   Collection Time: 02/16/19  3:30 PM  Result Value Ref Range   WBC 14.8 (H) 4.0 - 10.5 K/uL   RBC 4.35 3.87 - 5.11 MIL/uL   Hemoglobin 13.2 12.0 - 15.0 g/dL   HCT 47.4 25.9 - 56.3 %   MCV 90.8 80.0 - 100.0 fL   MCH 30.3 26.0 - 34.0 pg   MCHC 33.4 30.0 - 36.0 g/dL   RDW 87.5 64.3 - 32.9 %   Platelets 392 150 - 400 K/uL   nRBC 0.0 0.0 - 0.2 %   Neutrophils Relative % 69 %   Neutro Abs 10.3 (H) 1.7 - 7.7 K/uL   Lymphocytes Relative 22 %   Lymphs Abs 3.2 0.7 - 4.0 K/uL   Monocytes Relative 5 %   Monocytes Absolute 0.8 0.1 - 1.0 K/uL   Eosinophils Relative 2 %   Eosinophils Absolute 0.3 0.0 - 0.5 K/uL   Basophils Relative 1 %   Basophils Absolute 0.1 0.0 - 0.1 K/uL   Immature Granulocytes 1 %   Abs Immature Granulocytes 0.09 (H) 0.00 - 0.07 K/uL  Urinalysis, Routine w reflex microscopic     Status: Abnormal   Collection Time: 02/16/19  4:00 PM  Result Value Ref Range   Color, Urine YELLOW YELLOW   APPearance CLEAR CLEAR   Specific Gravity, Urine 1.023 1.005 - 1.030   pH 6.0 5.0 - 8.0   Glucose, UA NEGATIVE NEGATIVE mg/dL   Hgb urine dipstick NEGATIVE NEGATIVE   Bilirubin Urine NEGATIVE NEGATIVE   Ketones, ur 20 (A) NEGATIVE mg/dL   Protein, ur NEGATIVE NEGATIVE mg/dL   Nitrite NEGATIVE NEGATIVE   Leukocytes,Ua NEGATIVE NEGATIVE    Assessment and Plan  --29 y.o. [id number] at [redacted]w[redacted]d  --FHT 150 via  BSUS --Discharge home in stable condition  F/U: --Patient to find new [facility] Provider at her earliest convenience  Calvert Cantor, [place] 02/16/2019, 8:20 PM

## 2019-02-16 NOTE — ED Provider Notes (Addendum)
[place] EMERGENCY DEPARTMENT Provider Note   CSN: [id number] Arrival date & time: 02/16/19  1436     History Chief Complaint  Patient presents with  . Dizziness  . Abdominal Pain  . Headache    Sara Carpenter is a 29 y.o. female.  Patient is a 29 year old female G5 currently about [redacted] weeks pregnant with hx of bipolar disorder, obesity, PCOS presenting to the emergency department for pelvic cramping and headache and palpitations.  Patient reports she had a normal OB ultrasound at 10 weeks and was seeing another OB provider but was not happy with their care so currently has no OB/GYN.  Patient reports that for the last couple of weeks she has had a headache as well as feeling like she is having palpitations occasionally.  Reports that she has been having some lower pelvic cramping for the last several weeks but this morning it seemed to be worse when it radiated from her back into the lower part of her belly.  Denies any dysuria, vaginal bleeding, vaginal discharge.  Denies any fever, cough, URI symptoms.  Denies any other neurological complaints.        Past Medical History:  Diagnosis Date  . Anxiety   . Bipolar 1 disorder ([place])   . Depression    is fine  . Infected tooth   . Infection    UTI  . Obesity   . PCOS (polycystic ovarian syndrome)    "When I was young". No medical intervention.  . Vaginal Pap smear, abnormal    did not f/u    Patient Active Problem List   Diagnosis Date Noted  . Gonorrhea & Chlamydia 03/05/2016  . Bipolar disorder ([place]) 02/27/2016  . History of PCOS 02/27/2016  . Morbid obesity ([place])   . Abscess of breast 12/10/2012  . Sebaceous cyst 12/10/2012    Past Surgical History:  Procedure Laterality Date  . DILATION AND CURETTAGE OF UTERUS N/A 09/03/2012   Procedure: SUCTION DILATATION AND CURETTAGE;  Surgeon: Florian Buff, MD;  Location: [facility];  Service: Gynecology;  Laterality: N/A;  . TONSILLECTOMY       OB  History    Gravida  4   Para  1   Term  1   Preterm      AB  2   Living  1     SAB  2   TAB      Ectopic      Multiple      Live Births  1           Family History  Problem Relation Age of Onset  . Diabetes Paternal Grandmother   . Diabetes Paternal Grandfather   . Cancer Mother        cervicle  . Cancer Paternal Uncle        lung  . Cancer Maternal Grandmother        lung  . Cancer Maternal Grandfather        throat  . Healthy Father     Social History   Tobacco Use  . Smoking status: Former Smoker    Packs/day: 0.00    Years: 2.00    Pack years: 0.00    Types: Cigarettes    Quit date: 08/25/2012    Years since quitting: 6.4  . Smokeless tobacco: Never Used  Substance Use Topics  . Alcohol use: Not Currently    Comment: occas  . Drug use: Yes    Types:  Marijuana    Comment: Everyday    Home Medications Prior to Admission medications   Medication Sig Start Date End Date Taking? Authorizing Provider  albuterol (PROVENTIL HFA;VENTOLIN HFA) 108 (90 Base) MCG/ACT inhaler Inhale 1-2 puffs into the lungs every 6 (six) hours as needed for wheezing or shortness of breath.    [provider]  cephALEXin (KEFLEX) 500 MG capsule Take 1 capsule (500 mg total) by mouth 4 (four) times daily. 06/20/18   Burgess Amor, PA-C  etonogestrel-ethinyl estradiol (NUVARING) 0.12-0.015 MG/24HR vaginal ring Insert vaginally and leave in place for 3 consecutive weeks, then remove for 1 week. 10/22/16   Cheral Marker, CNM  ibuprofen (ADVIL) 600 MG tablet Take 1 tablet (600 mg total) by mouth every 6 (six) hours as needed. 06/20/18   Burgess Amor, PA-C  metroNIDAZOLE (FLAGYL) 500 MG tablet Take 1 tablet (500 mg total) by mouth 2 (two) times daily. X 7 days. No sex or alcohol while taking 10/22/16   Cheral Marker, CNM  sulfamethoxazole-trimethoprim (BACTRIM DS,SEPTRA DS) 800-160 MG tablet Take 1 tablet by mouth 2 (two) times daily. X 7 days 10/22/16   Cheral Marker, CNM    Allergies    Carbamazepine  Review of Systems   Review of Systems  Constitutional: Negative for appetite change, chills and fever.  HENT: Negative for congestion.   Respiratory: Negative for cough and shortness of breath.   Cardiovascular: Negative for chest pain.  Gastrointestinal: Positive for abdominal pain. Negative for diarrhea, nausea and vomiting.  Genitourinary: Positive for pelvic pain. Negative for decreased urine volume, dysuria, hematuria, vaginal bleeding, vaginal discharge and vaginal pain.  Musculoskeletal: Positive for back pain. Negative for arthralgias, myalgias, neck pain and neck stiffness.  Neurological: Positive for headaches. Negative for dizziness, weakness, light-headedness and numbness.    Physical Exam Updated Vital Signs BP 127/88 (BP Location: Right Arm)   Pulse 98   Temp 99 F (37.2 C)   Resp 18   Ht 5\' 7"  (1.702 m)   Wt (!) 145.2 kg   LMP 10/21/2018   SpO2 99%   BMI 50.15 kg/m   Physical Exam Vitals and nursing note reviewed.  Constitutional:      General: She is not in acute distress.    Appearance: Normal appearance. She is obese. She is not ill-appearing, toxic-appearing or diaphoretic.     Comments: When I enter the room patient is observed walking to the recliner chair and sitting down without difficulty or pain.  HENT:     Head: Normocephalic.     Mouth/Throat:     Mouth: Mucous membranes are moist.  Eyes:     Conjunctiva/sclera: Conjunctivae normal.  Cardiovascular:     Rate and Rhythm: Normal rate and regular rhythm.     Pulses: Normal pulses.     Heart sounds: Normal heart sounds.  Pulmonary:     Effort: Pulmonary effort is normal.     Breath sounds: Normal breath sounds.  Abdominal:     Tenderness: There is no abdominal tenderness.  Skin:    General: Skin is dry.  Neurological:     General: No focal deficit present.     Mental Status: She is alert.     Gait: Gait normal.  Psychiatric:        Mood and  Affect: Mood normal.     ED Results / Procedures / Treatments   Labs (all labs ordered are listed, but only abnormal results are displayed) Labs Reviewed  BASIC  METABOLIC PANEL - Abnormal; Notable for the following components:      Result Value   Sodium 134 (*)    CO2 20 (*)    BUN 5 (*)    All other components within normal limits  CBC WITH DIFFERENTIAL/PLATELET - Abnormal; Notable for the following components:   WBC 14.8 (*)    Neutro Abs 10.3 (*)    Abs Immature Granulocytes 0.09 (*)    All other components within normal limits  URINALYSIS, ROUTINE W REFLEX MICROSCOPIC - Abnormal; Notable for the following components:   Ketones, ur 20 (*)    All other components within normal limits    EKG EKG Interpretation  Date/Time:  Monday February 16 2019 15:17:47 EST Ventricular Rate:  107 PR Interval:  114 QRS Duration: 90 QT Interval:  338 QTC Calculation: 451 R Axis:   65 Text Interpretation: Sinus tachycardia T wave changes similar to 2017 tracing. Abnormal ECG No STEMI Confirmed by Alona Bene [phone]) on 02/16/2019 3:29:06 PM   Radiology No results found.  Procedures Procedures (including critical care time)  Medications Ordered in ED Medications - No data to display  ED Course  I have reviewed the triage vital signs and the nursing notes.  Pertinent labs & imaging results that were available during my care of the patient were reviewed by me and considered in my medical decision making (see chart for details).  Clinical Course as of Feb 15 1813  Mon Feb 16, 2019  1512 Patient is a 29 year old female approximately [redacted] weeks pregnant presenting with pelvic pain, headache and palpitations.  On my exam she appears well with normal vital signs.  I contacted MAU Candise Bowens) who would be happy to see the patient after we work her up for heart palpitations.  EKG, basic labs, urinalysis performed in the emergency department.   [KM]  1612 Pregnant patient presenting with multiple  complaints today. Her biggest complaint is pelvic pain for some time which worsened this morning, radiating to her back as well as headache and palpitations. Patient's EKG revealed mild tachycardia to 107 but otherwise no acute changes.  White count is elevated to 14.8.  Her urinalysis is with 20 ketones but otherwise negative for infection.  Patient appears well and stable.  No shortness of breath, afebrile, no chest pain. No peritoneal signs on exam and no focal neuro deficits. Patient has had multiple symptoms for a prolonged period of time with no acute or emergent findings on my exam today. Leukocytosis may be attributed to stress and/or obesity or other non-emergent problem as she has had consistent leukocytosis in the past.  I do not believe there is need for further work-up in the emergency department on this side and she will be transferred to MAU for further work-up of her pelvic pain. She may return to the ED for re-evaluation if any new or worsening symptoms arise.    [KM]    Clinical Course User Index [KM] Jeral Pinch   MDM Rules/Calculators/A&P                       Final Clinical Impression(s) / ED Diagnoses Final diagnoses:  None    Rx / DC Orders ED Discharge Orders    None                  Jeral Pinch 02/16/19 1819    Arby Barrette, MD 02/23/19 [phone]

## 2019-02-16 NOTE — MAU Note (Signed)
Went to [facility] on Jan 7.

## 2019-02-16 NOTE — Discharge Instructions (Signed)
Second Trimester of Pregnancy  The second trimester is from week 14 through week 27 (month 4 through 6). This is often the time in pregnancy that you feel your best. Often times, morning sickness has lessened or quit. You may have more energy, and you may get hungry more often. Your unborn baby is growing rapidly. At the end of the sixth month, he or she is about 9 inches long and weighs about 1 pounds. You will likely feel the baby move between 18 and 20 weeks of pregnancy. Follow these instructions at home: Medicines  Take over-the-counter and prescription medicines only as told by your doctor. Some medicines are safe and some medicines are not safe during pregnancy.  Take a prenatal vitamin that contains at least 600 micrograms (mcg) of folic acid.  If you have trouble pooping (constipation), take medicine that will make your stool soft (stool softener) if your doctor approves. Eating and drinking   Eat regular, healthy meals.  Avoid raw meat and uncooked cheese.  If you get low calcium from the food you eat, talk to your doctor about taking a daily calcium supplement.  Avoid foods that are high in fat and sugars, such as fried and sweet foods.  If you feel sick to your stomach (nauseous) or throw up (vomit): ? Eat 4 or 5 small meals a day instead of 3 large meals. ? Try eating a few soda crackers. ? Drink liquids between meals instead of during meals.  To prevent constipation: ? Eat foods that are high in fiber, like fresh fruits and vegetables, whole grains, and beans. ? Drink enough fluids to keep your pee (urine) clear or pale yellow. Activity  Exercise only as told by your doctor. Stop exercising if you start to have cramps.  Do not exercise if it is too hot, too humid, or if you are in a place of great height (high altitude).  Avoid heavy lifting.  Wear low-heeled shoes. Sit and stand up straight.  You can continue to have sex unless your doctor tells you not  to. Relieving pain and discomfort  Wear a good support bra if your breasts are tender.  Take warm water baths (sitz baths) to soothe pain or discomfort caused by hemorrhoids. Use hemorrhoid cream if your doctor approves.  Rest with your legs raised if you have leg cramps or low back pain.  If you develop puffy, bulging veins (varicose veins) in your legs: ? Wear support hose or compression stockings as told by your doctor. ? Raise (elevate) your feet for 15 minutes, 3-4 times a day. ? Limit salt in your food. Prenatal care  Write down your questions. Take them to your prenatal visits.  Keep all your prenatal visits as told by your doctor. This is important. Safety  Wear your seat belt when driving.  Make a list of emergency phone numbers, including numbers for family, friends, the hospital, and police and fire departments. General instructions  Ask your doctor about the right foods to eat or for help finding a counselor, if you need these services.  Ask your doctor about local prenatal classes. Begin classes before month 6 of your pregnancy.  Do not use hot tubs, steam rooms, or saunas.  Do not douche or use tampons or scented sanitary pads.  Do not cross your legs for long periods of time.  Visit your dentist if you have not done so. Use a soft toothbrush to brush your teeth. Floss gently.  Avoid all smoking, herbs,   and alcohol. Avoid drugs that are not approved by your doctor.  Do not use any products that contain nicotine or tobacco, such as cigarettes and e-cigarettes. If you need help quitting, ask your doctor.  Avoid cat litter boxes and soil used by cats. These carry germs that can cause birth defects in the baby and can cause a loss of your baby (miscarriage) or stillbirth. Contact a doctor if:  You have mild cramps or pressure in your lower belly.  You have pain when you pee (urinate).  You have bad smelling fluid coming from your vagina.  You continue to  feel sick to your stomach (nauseous), throw up (vomit), or have watery poop (diarrhea).  You have a nagging pain in your belly area.  You feel dizzy. Get help right away if:  You have a fever.  You are leaking fluid from your vagina.  You have spotting or bleeding from your vagina.  You have severe belly cramping or pain.  You lose or gain weight rapidly.  You have trouble catching your breath and have chest pain.  You notice sudden or extreme puffiness (swelling) of your face, hands, ankles, feet, or legs.  You have not felt the baby move in over an hour.  You have severe headaches that do not go away when you take medicine.  You have trouble seeing. Summary  The second trimester is from week 14 through week 27 (months 4 through 6). This is often the time in pregnancy that you feel your best.  To take care of yourself and your unborn baby, you will need to eat healthy meals, take medicines only if your doctor tells you to do so, and do activities that are safe for you and your baby.  Call your doctor if you get sick or if you notice anything unusual about your pregnancy. Also, call your doctor if you need help with the right food to eat, or if you want to know what activities are safe for you. This information is not intended to replace advice given to you by your health care provider. Make sure you discuss any questions you have with your health care provider. Document Revised: 04/25/2018 Document Reviewed: 02/07/2016 Elsevier Patient Education  2020 [employer].   Prenatal Care Providers           [facility] @ [facility]   Phone: [phone]  [facility] for [facility] @ [facility]   Phone: [phone]  [facility] @[place]       Phone: [phone]            [facility] @ [place]     Phone: [phone]          [facility] @ [facility]   Phone: [phone]  [facility] for [facility] @ [facility]  Phone: [phone]  [facility] for [facility] @ [facility] Phone: [phone]     [facility]  Phone: [phone]  [facility]  Phone: [phone]  [phone] OB/GYN Phone: [phone]  [facility] Phone: [phone]  [facility] Phone: [phone]  [facility] Phone: [phone]  [facility]  Phone: [phone]

## 2019-02-16 NOTE — MAU Note (Signed)
Pt sent up from ER. Has been having HA(takes Tylenol, goes away for a little while then comes right back). pretty much constant for the last couple weeks, some dizzy spells, and some cramping.

## 2019-02-16 NOTE — ED Triage Notes (Signed)
Pt reports being 14 weeks preg with dizziness, headache and soreness on the right lower abdomen. Denies fever, N/V/D. A.o NAD. Denies vaginal bleeding or d/c.

## 2019-02-16 NOTE — ED Notes (Signed)
EDP at bedside for assessment. A note to follow for transport to MAU.

## 2019-03-06 ENCOUNTER — Other Ambulatory Visit: Payer: Self-pay

## 2019-03-06 ENCOUNTER — Encounter (HOSPITAL_COMMUNITY): Payer: Self-pay | Admitting: Obstetrics & Gynecology

## 2019-03-06 ENCOUNTER — Inpatient Hospital Stay (HOSPITAL_COMMUNITY)
Admission: AD | Admit: 2019-03-06 | Discharge: 2019-03-07 | Disposition: A | Discharge status: Home / Self Care | Payer: BC Managed Care – PPO | Attending: Obstetrics & Gynecology | Admitting: Obstetrics & Gynecology

## 2019-03-06 ENCOUNTER — Inpatient Hospital Stay (HOSPITAL_BASED_OUTPATIENT_CLINIC_OR_DEPARTMENT_OTHER): Payer: BC Managed Care – PPO

## 2019-03-06 DIAGNOSIS — O26892 Other specified pregnancy related conditions, second trimester: Secondary | ICD-10-CM

## 2019-03-06 DIAGNOSIS — Z87891 Personal history of nicotine dependence: Secondary | ICD-10-CM | POA: Insufficient documentation

## 2019-03-06 DIAGNOSIS — Z3A19 19 weeks gestation of pregnancy: Secondary | ICD-10-CM | POA: Insufficient documentation

## 2019-03-06 DIAGNOSIS — O99212 Obesity complicating pregnancy, second trimester: Secondary | ICD-10-CM | POA: Insufficient documentation

## 2019-03-06 DIAGNOSIS — R109 Unspecified abdominal pain: Secondary | ICD-10-CM | POA: Diagnosis not present

## 2019-03-06 DIAGNOSIS — Z3A13 13 weeks gestation of pregnancy: Secondary | ICD-10-CM

## 2019-03-06 DIAGNOSIS — Z888 Allergy status to other drugs, medicaments and biological substances status: Secondary | ICD-10-CM | POA: Diagnosis not present

## 2019-03-06 DIAGNOSIS — O0932 Supervision of pregnancy with insufficient antenatal care, second trimester: Secondary | ICD-10-CM

## 2019-03-06 DIAGNOSIS — N760 Acute vaginitis: Secondary | ICD-10-CM

## 2019-03-06 DIAGNOSIS — O26891 Other specified pregnancy related conditions, first trimester: Secondary | ICD-10-CM

## 2019-03-06 DIAGNOSIS — B9689 Other specified bacterial agents as the cause of diseases classified elsewhere: Secondary | ICD-10-CM

## 2019-03-06 DIAGNOSIS — R102 Pelvic and perineal pain: Secondary | ICD-10-CM | POA: Diagnosis present

## 2019-03-06 LAB — URINALYSIS, ROUTINE W REFLEX MICROSCOPIC
Glucose, UA: NEGATIVE mg/dL
Hgb urine dipstick: NEGATIVE
Ketones, ur: 5 mg/dL — AB
Nitrite: NEGATIVE
Protein, ur: 30 mg/dL — AB
Specific Gravity, Urine: 1.03 (ref 1.005–1.030)
pH: 6 (ref 5.0–8.0)

## 2019-03-06 LAB — WET PREP, GENITAL
Sperm: NONE SEEN
Trich, Wet Prep: NONE SEEN
Yeast Wet Prep HPF POC: NONE SEEN

## 2019-03-06 LAB — DIFFERENTIAL
Abs Immature Granulocytes: 0.14 10*3/uL — ABNORMAL HIGH (ref 0.00–0.07)
Basophils Absolute: 0.1 10*3/uL (ref 0.0–0.1)
Basophils Relative: 1 %
Eosinophils Absolute: 0.6 10*3/uL — ABNORMAL HIGH (ref 0.0–0.5)
Eosinophils Relative: 4 %
Immature Granulocytes: 1 %
Lymphocytes Relative: 19 %
Lymphs Abs: 3 10*3/uL (ref 0.7–4.0)
Monocytes Absolute: 0.9 10*3/uL (ref 0.1–1.0)
Monocytes Relative: 6 %
Neutro Abs: 11 10*3/uL — ABNORMAL HIGH (ref 1.7–7.7)
Neutrophils Relative %: 69 %

## 2019-03-06 LAB — CBC
HCT: 37.5 % (ref 36.0–46.0)
Hemoglobin: 12.7 g/dL (ref 12.0–15.0)
MCH: 30.5 pg (ref 26.0–34.0)
MCHC: 33.9 g/dL (ref 30.0–36.0)
MCV: 90.1 fL (ref 80.0–100.0)
Platelets: 344 10*3/uL (ref 150–400)
RBC: 4.16 MIL/uL (ref 3.87–5.11)
RDW: 12.9 % (ref 11.5–15.5)
WBC: 15.7 10*3/uL — ABNORMAL HIGH (ref 4.0–10.5)
nRBC: 0 % (ref 0.0–0.2)

## 2019-03-06 MED ORDER — ACETAMINOPHEN 500 MG PO TABS
1000.0000 mg | ORAL_TABLET | Freq: Once | ORAL | Status: AC
  Administered 2019-03-06: 1000 mg via ORAL
  Filled 2019-03-06: qty 2

## 2019-03-06 MED ORDER — CYCLOBENZAPRINE HCL 5 MG PO TABS
10.0000 mg | ORAL_TABLET | Freq: Once | ORAL | Status: AC
  Administered 2019-03-06: 10 mg via ORAL
  Filled 2019-03-06: qty 2

## 2019-03-06 NOTE — MAU Provider Note (Signed)
History     CSN: [id number]  Arrival date and time: 03/06/19 2009   First Provider Initiated Contact with Patient 03/06/19 2110      Chief Complaint  Patient presents with  . Pelvic Pain   Sara Carpenter is a 29 y.o. [id number] at [redacted]w[redacted]d who has not initiated [facility] d/t costs.  She presents today for cramping.  She states the cramping started about an hour ago and was intermittent in nature.  She states the pain was so bad she couldn't walk.  She states the pain is better now with laying, but the area feels sore.  She rates the pain a 8/10 when it was occurring, but it is currently a 3/10.  She states the pain is improved with laying and increased with sitting, standing, and "breathing in."  She endorses sexual activity two days ago without any cramping afterwards.  She states she had some cramping on Wednesday after walking around the grocery store, but it resolved with rest. Patient states she has had "plenty" of water today and quantifies that as 3 bottles.  She goes on to state that this is normal for her and "I don't really drink very much."      OB History    Gravida  4   Para  1   Term  1   Preterm      AB  2   Living  1     SAB  2   TAB      Ectopic      Multiple      Live Births  1           Past Medical History:  Diagnosis Date  . Anxiety   . Bipolar 1 disorder (HCC)   . Depression    is fine  . Infected tooth   . Infection    UTI  . Obesity   . PCOS (polycystic ovarian syndrome)    "When I was young". No medical intervention.  . Vaginal Pap smear, abnormal    did not f/u    Past Surgical History:  Procedure Laterality Date  . DILATION AND CURETTAGE OF UTERUS N/A 09/03/2012   Procedure: SUCTION DILATATION AND CURETTAGE;  Surgeon: Lazaro Arms, MD;  Location: [facility];  Service: Gynecology;  Laterality: N/A;  . TONSILLECTOMY      Family History  Problem Relation Age of Onset  . Diabetes Paternal Grandmother   . Diabetes Paternal  Grandfather   . Cancer Mother        cervicle  . Cancer Paternal Uncle        lung  . Cancer Maternal Grandmother        lung  . Cancer Maternal Grandfather        throat  . Healthy Father     Social History   Tobacco Use  . Smoking status: Former Smoker    Packs/day: 0.00    Years: 2.00    Pack years: 0.00    Types: Cigarettes    Quit date: 08/25/2012    Years since quitting: 6.5  . Smokeless tobacco: Never Used  Substance Use Topics  . Alcohol use: Not Currently    Comment: occas  . Drug use: Yes    Types: Marijuana    Comment: Everyday    Allergies:  Allergies  Allergen Reactions  . Carbamazepine Nausea And Vomiting    Medications Prior to Admission  Medication Sig Dispense Refill Last Dose  . metroNIDAZOLE (FLAGYL) 500  MG tablet Take 1 tablet (500 mg total) by mouth 2 (two) times daily. X 7 days. No sex or alcohol while taking 14 tablet 0 03/06/2019 at Unknown time  . albuterol (PROVENTIL HFA;VENTOLIN HFA) 108 (90 Base) MCG/ACT inhaler Inhale 1-2 puffs into the lungs every 6 (six) hours as needed for wheezing or shortness of breath.     . cephALEXin (KEFLEX) 500 MG capsule Take 1 capsule (500 mg total) by mouth 4 (four) times daily. 28 capsule 0   . ibuprofen (ADVIL) 600 MG tablet Take 1 tablet (600 mg total) by mouth every 6 (six) hours as needed. 30 tablet 0   . sulfamethoxazole-trimethoprim (BACTRIM DS,SEPTRA DS) 800-160 MG tablet Take 1 tablet by mouth 2 (two) times daily. X 7 days 14 tablet 0     Review of Systems  Constitutional: Negative for chills and fever.  Respiratory: Negative for cough and shortness of breath.   Gastrointestinal: Positive for abdominal pain (Cramping). Negative for constipation, diarrhea, nausea and vomiting.  Genitourinary: Positive for pelvic pain (Intermittent). Negative for difficulty urinating, dyspareunia, dysuria, vaginal bleeding and vaginal discharge.  Neurological: Negative for dizziness, light-headedness and headaches.    Physical Exam   Blood pressure 134/89, pulse (!) 133, temperature 99 F (37.2 C), temperature source Oral, resp. rate (!) 24, height 5\' 7"  (1.702 m), weight (!) 148.6 kg, last menstrual period 10/21/2018, unknown if currently breastfeeding.  Physical Exam  Constitutional: She is oriented to person, place, and time.  Morbidly Obese  HENT:  Head: Normocephalic and atraumatic.  Eyes: Conjunctivae are normal.  Cardiovascular: Normal rate, regular rhythm and normal heart sounds.  Respiratory: Effort normal and breath sounds normal.  GI: Soft. There is abdominal tenderness.  Genitourinary: Cervix exhibits no discharge.    Vaginal discharge present.     No vaginal bleeding.  No bleeding in the vagina.    Genitourinary Comments: Speculum Exam: -Normal External Genitalia: Non tender, no apparent discharge at introitus.  -Vaginal Vault: Pink mucosa with good rugae. Moderate amt thick white discharge -wet prep collected -Cervix:Pink, no lesions, cysts, or polyps.  Appears closed. No active bleeding from os-GC/CT collected -Bimanual Exam:  Difficult to assess d/t body habitus.    Musculoskeletal:        General: Normal range of motion.     Cervical back: Normal range of motion.  Neurological: She is alert and oriented to person, place, and time.  Skin: Skin is warm and dry.  Psychiatric: She has a normal mood and affect. Her behavior is normal.    MAU Course  Procedures Results for orders placed or performed during the hospital encounter of 03/06/19 (from the past 24 hour(s))  Urinalysis, Routine w reflex microscopic     Status: Abnormal   Collection Time: 03/06/19  9:31 PM  Result Value Ref Range   Color, Urine YELLOW YELLOW   APPearance CLOUDY (A) CLEAR   Specific Gravity, Urine 1.030 1.005 - 1.030   pH 6.0 5.0 - 8.0   Glucose, UA NEGATIVE NEGATIVE mg/dL   Hgb urine dipstick NEGATIVE NEGATIVE   Bilirubin Urine SMALL (A) NEGATIVE   Ketones, ur 5 (A) NEGATIVE mg/dL   Protein, ur  30 (A) NEGATIVE mg/dL   Nitrite NEGATIVE NEGATIVE   Leukocytes,Ua SMALL (A) NEGATIVE   RBC / HPF 0-5 0 - 5 RBC/hpf   WBC, UA 11-20 0 - 5 WBC/hpf   Bacteria, UA FEW (A) NONE SEEN   Squamous Epithelial / LPF 21-50 0 - 5   Mucus PRESENT  Ca Oxalate Crys, UA PRESENT   Wet prep, genital     Status: Abnormal   Collection Time: 03/06/19  9:39 PM   Specimen: PATH Cytology Cervicovaginal Ancillary Only  Result Value Ref Range   Yeast Wet Prep HPF POC NONE SEEN NONE SEEN   Trich, Wet Prep NONE SEEN NONE SEEN   Clue Cells Wet Prep HPF POC PRESENT (A) NONE SEEN   WBC, Wet Prep HPF POC MANY (A) NONE SEEN   Sperm NONE SEEN   CBC     Status: Abnormal   Collection Time: 03/06/19  9:47 PM  Result Value Ref Range   WBC 15.7 (H) 4.0 - 10.5 K/uL   RBC 4.16 3.87 - 5.11 MIL/uL   Hemoglobin 12.7 12.0 - 15.0 g/dL   HCT 76.2 83.1 - 51.7 %   MCV 90.1 80.0 - 100.0 fL   MCH 30.5 26.0 - 34.0 pg   MCHC 33.9 30.0 - 36.0 g/dL   RDW 61.6 07.3 - 71.0 %   Platelets 344 150 - 400 K/uL   nRBC 0.0 0.0 - 0.2 %  Differential     Status: Abnormal   Collection Time: 03/06/19  9:47 PM  Result Value Ref Range   Neutrophils Relative % 69 %   Neutro Abs 11.0 (H) 1.7 - 7.7 K/uL   Lymphocytes Relative 19 %   Lymphs Abs 3.0 0.7 - 4.0 K/uL   Monocytes Relative 6 %   Monocytes Absolute 0.9 0.1 - 1.0 K/uL   Eosinophils Relative 4 %   Eosinophils Absolute 0.6 (H) 0.0 - 0.5 K/uL   Basophils Relative 1 %   Basophils Absolute 0.1 0.0 - 0.1 K/uL   Immature Granulocytes 1 %   Abs Immature Granulocytes 0.14 (H) 0.00 - 0.07 K/uL      MDM Pelvic Exam; Wet Prep and GC/CT Labs: UA, UPT, CBC, OB Panel, BL PreEclampsia Labs Ultrasound Pain Medication Assessment and Plan  29 year old, [id number]  SIUP at 19.3weeks Abdominal Pain  -Patient reassured that there is resources available and clinics that will allow her to establish care prior to her medicaid becoming active. -Informed that  -Reviewed POC with patient. -Exam  performed and findings discussed.  -Patient reports that her dates are incorrect and should be based on a 10 week [place] that was performed at [facility].  However, patient has no official records, just an ultrasound pic on her phone. -Patient agreeable to labs today including initial Prenatals. -Will give tylenol for pain and reassess. -FHT doppler by nurse and heard by provider.  150bpm   Cherre Robins 03/06/2019, 9:10 PM   Reassessment (10:40 PM) Bacterial Vaginosis  -Will send urine for culture.  -In room to discuss results as above. -Patient reports pain has not improved with tylenol dosing. -Patient further reports that she started treatment for BV two days ago after a virtual visit. -Will try Flexeril for pain and send for limited US. -Patient agreeable to plan and without questions or concerns. -Will await results.  -Lab reports unable to obtain add-on CMP without a redraw.  Will cancel.   Reassessment (11:50 PM)  -Preliminary [place] returns without significant findings. -Patient reports improvement in pain. -Instructed to continue flagyl dosing for BV. -Informed that message sent for establishment of care. -Questions regarding potential causes for abdominal cramping addressed. -Instructed to monitor symptoms and take tylenol as well as increase water intake as necessary. -Patient without further questions or concerns. -Encouraged to call or return to MAU if symptoms  worsen or with the onset of new symptoms. -Discharged to home in improved condition.  Maryann Conners MSN, CNM Advanced Practice Provider, [facility]

## 2019-03-06 NOTE — MAU Note (Addendum)
Pt here with complaints of "cramps in uterus" that started about 1 hour ago. Hurts to stand/walk. Has not taken anything for pain. Rates 8/10. Had some pain simliar the other day after walking around grocery store, but went away. Denies vaginal bleeding or discharge. Pt denies urinary s/s. No PNC

## 2019-03-06 NOTE — Discharge Instructions (Signed)
Abdominal Pain During Pregnancy  Abdominal pain is common during pregnancy, and has many possible causes. Some causes are more serious than others, and sometimes the cause is not known. Abdominal pain can be a sign that labor is starting. It can also be caused by normal growth and stretching of muscles and ligaments during pregnancy. Always tell your health care provider if you have any abdominal pain. Follow these instructions at home:  Do not have sex or put anything in your vagina until your pain goes away completely.  Get plenty of rest until your pain improves.  Drink enough fluid to keep your urine pale yellow.  Take over-the-counter and prescription medicines only as told by your health care provider.  Keep all follow-up visits as told by your health care provider. This is important. Contact a health care provider if:  Your pain continues or gets worse after resting.  You have lower abdominal pain that: ? Comes and goes at regular intervals. ? Spreads to your back. ? Is similar to menstrual cramps.  You have pain or burning when you urinate. Get help right away if:  You have a fever or chills.  You have vaginal bleeding.  You are leaking fluid from your vagina.  You are passing tissue from your vagina.  You have vomiting or diarrhea that lasts for more than 24 hours.  Your baby is moving less than usual.  You feel very weak or faint.  You have shortness of breath.  You develop severe pain in your upper abdomen. Summary  Abdominal pain is common during pregnancy, and has many possible causes.  If you experience abdominal pain during pregnancy, tell your health care provider right away.  Follow your health care provider's home care instructions and keep all follow-up visits as directed. This information is not intended to replace advice given to you by your health care provider. Make sure you discuss any questions you have with your health care  provider. Document Revised: 04/21/2018 Document Reviewed: 04/05/2016 Elsevier Patient Education  2020 [employer]  

## 2019-03-07 LAB — HEPATITIS B SURFACE ANTIGEN: Hepatitis B Surface Ag: NONREACTIVE

## 2019-03-07 LAB — HIV ANTIBODY (ROUTINE TESTING W REFLEX): HIV Screen 4th Generation wRfx: NONREACTIVE

## 2019-03-07 LAB — PROTEIN / CREATININE RATIO, URINE
Creatinine, Urine: 346.65 mg/dL
Protein Creatinine Ratio: 0.05 mg/mg{Cre} (ref 0.00–0.15)
Total Protein, Urine: 18 mg/dL

## 2019-03-07 LAB — CULTURE, OB URINE

## 2019-03-07 LAB — RPR: RPR Ser Ql: NONREACTIVE

## 2019-03-08 LAB — RUBELLA SCREEN: Rubella: 2.13 index (ref >0.99)

## 2019-03-09 LAB — GC/CHLAMYDIA PROBE AMP (~~LOC~~) NOT AT ARMC
Chlamydia: NEGATIVE
Comment: NEGATIVE
Comment: NORMAL
Neisseria Gonorrhea: NEGATIVE

## 2019-03-30 ENCOUNTER — Telehealth: Payer: Self-pay | Admitting: Family Medicine

## 2019-03-30 NOTE — Telephone Encounter (Signed)
Called the patient twice to schedule the new ob intake. The line picked up however no one said hello. After saying hello 3 times on each call the call was disconnected.

## 2019-04-22 ENCOUNTER — Encounter: Payer: Self-pay | Admitting: *Deleted

## 2019-05-01 ENCOUNTER — Other Ambulatory Visit: Payer: Self-pay | Admitting: Obstetrics & Gynecology

## 2019-05-01 DIAGNOSIS — Z363 Encounter for antenatal screening for malformations: Secondary | ICD-10-CM

## 2019-05-04 ENCOUNTER — Ambulatory Visit (INDEPENDENT_AMBULATORY_CARE_PROVIDER_SITE_OTHER): Payer: Medicaid Other

## 2019-05-04 ENCOUNTER — Encounter: Payer: Self-pay | Admitting: Women's Health

## 2019-05-04 ENCOUNTER — Ambulatory Visit (INDEPENDENT_AMBULATORY_CARE_PROVIDER_SITE_OTHER): Payer: Medicaid Other | Admitting: Women's Health

## 2019-05-04 ENCOUNTER — Other Ambulatory Visit: Payer: Medicaid Other

## 2019-05-04 ENCOUNTER — Other Ambulatory Visit (HOSPITAL_COMMUNITY)
Admission: RE | Admit: 2019-05-04 | Discharge: 2019-05-04 | Disposition: A | Discharge status: Home / Self Care | Payer: Medicaid Other | Source: Ambulatory Visit | Attending: Obstetrics & Gynecology | Admitting: Obstetrics & Gynecology

## 2019-05-04 ENCOUNTER — Other Ambulatory Visit: Payer: Self-pay

## 2019-05-04 VITALS — BP 131/92 | Wt 331.2 lb

## 2019-05-04 DIAGNOSIS — Z3480 Encounter for supervision of other normal pregnancy, unspecified trimester: Secondary | ICD-10-CM

## 2019-05-04 DIAGNOSIS — Z363 Encounter for antenatal screening for malformations: Secondary | ICD-10-CM

## 2019-05-04 DIAGNOSIS — Z3A25 25 weeks gestation of pregnancy: Secondary | ICD-10-CM

## 2019-05-04 DIAGNOSIS — O10912 Unspecified pre-existing hypertension complicating pregnancy, second trimester: Secondary | ICD-10-CM

## 2019-05-04 DIAGNOSIS — O0990 Supervision of high risk pregnancy, unspecified, unspecified trimester: Secondary | ICD-10-CM | POA: Insufficient documentation

## 2019-05-04 DIAGNOSIS — N9089 Other specified noninflammatory disorders of vulva and perineum: Secondary | ICD-10-CM

## 2019-05-04 DIAGNOSIS — O0992 Supervision of high risk pregnancy, unspecified, second trimester: Secondary | ICD-10-CM

## 2019-05-04 DIAGNOSIS — Z131 Encounter for screening for diabetes mellitus: Secondary | ICD-10-CM

## 2019-05-04 DIAGNOSIS — O10919 Unspecified pre-existing hypertension complicating pregnancy, unspecified trimester: Secondary | ICD-10-CM | POA: Insufficient documentation

## 2019-05-04 DIAGNOSIS — R768 Other specified abnormal immunological findings in serum: Secondary | ICD-10-CM

## 2019-05-04 LAB — POCT URINALYSIS DIPSTICK OB
Blood, UA: NEGATIVE
Glucose, UA: NEGATIVE
Ketones, UA: NEGATIVE
Leukocytes, UA: NEGATIVE
Nitrite, UA: NEGATIVE

## 2019-05-04 MED ORDER — BLOOD PRESSURE MONITOR MISC: 0 refills | Status: DC

## 2019-05-04 NOTE — Progress Notes (Signed)
Korea 25+3 wks,breech,anterior placenta gr 0,normal ovaries,cx 4.5 cm,svp of fluid 4.7 cm, fhr 148 bpm,efw 893 g 68 %,anatomy complete,limited view because of pt body habitus,no obvious abnormalities

## 2019-05-04 NOTE — Patient Instructions (Addendum)
Sara Carpenter, I greatly value your feedback.  If you receive a survey following your visit with [place] today, we appreciate you taking the time to fill it out.  Thanks, Joellyn Haff, CNM, WHNP-BC   [facility] ([address] [place], [place] [address]) Entrance C, located off of [address] valet parking  Go to [email or web address] to register for FREE online childbirth classes   Begin taking 162mg  (two 81mg  tablets) baby aspirin daily to decrease the risk of preeclampsia during pregnancy    Call the office [phone]) or go to [facility] if:  You begin to have strong, frequent contractions  Your water breaks.  Sometimes it is a big gush of fluid, sometimes it is just a trickle that keeps getting your panties wet or running down your legs  You have vaginal bleeding.  It is normal to have a small amount of spotting if your cervix was checked.   You don't feel your baby moving like normal.  If you don't, get you something to eat and drink and lay down and focus on feeling your baby move.  You should feel at least 10 movements in 2 hours.  If you don't, you should call the office or go to [facility].    Call the office [phone]) or go to [facility] hospital for these signs of pre-eclampsia:  Severe headache that does not go away with Tylenol  Visual changes- seeing spots, double, blurred vision  Pain under your right breast or upper abdomen that does not go away with Tums or heartburn medicine  Nausea and/or vomiting  Severe swelling in your hands, feet, and face      Tdap Vaccine  It is recommended that you get the Tdap vaccine during the third trimester of EACH pregnancy to help protect your baby from getting pertussis (whooping cough)  27-36 weeks is the BEST time to do this so that you can pass the protection on to your baby. During pregnancy is better than after pregnancy, but if you are unable to get it during pregnancy it will  be offered at the hospital.   You can get this vaccine with [phone], at the health department, your family doctor, or some local pharmacies  Everyone who will be around your baby should also be up-to-date on their vaccines before the baby comes. Adults (who are not pregnant) only need 1 dose of Tdap during adulthood.   [place] Pediatricians/Family Doctors:  [facility] [phone]            [facility] [phone]                 [facility] [phone] (usually not accepting new patients unless you have family there already, you are always welcome to call and ask)       [facility] Department [phone]       [facility] Pediatricians/Family Doctors:   [facility]: [phone]  [facility]: [phone]  [facility]: [phone]  [facility] Doctors:   [facility]: [phone]   [facility] Family [facility]: [phone]  [facility] Doctors:  [phone] Health Center: [phone]   Home Blood Pressure Monitoring for Patients   Your provider has recommended that you check your blood pressure (BP) at least once a week at home. If you do not have a blood pressure cuff at home, one will be provided for you. Contact your provider if you have not received  your monitor within 1 week.   Helpful Tips for Accurate Home Blood Pressure Checks  . Don't smoke, exercise, or drink caffeine 30 minutes before checking your BP . Use the restroom before checking your BP (a full bladder can raise your pressure) . Relax in a comfortable upright chair . Feet on the ground . Left arm resting comfortably on a flat surface at the level of your heart . Legs uncrossed . Back supported . Sit quietly and don't talk . Place the cuff on your bare arm . Adjust snuggly, so that only two fingertips can fit between your skin and the top of the cuff . Check 2 readings separated by  at least one minute . Keep a log of your BP readings . For a visual, please reference this diagram: http://ccnc.care/bpdiagram  Provider Name: [facility]     Phone: [phone]  Zone 1: ALL CLEAR  Continue to monitor your symptoms:  . BP reading is less than 140 (top number) or less than 90 (bottom number)  . No right upper stomach pain . No headaches or seeing spots . No feeling nauseated or throwing up . No swelling in face and hands  Zone 2: CAUTION Call your doctor's office for any of the following:  . BP reading is greater than 140 (top number) or greater than 90 (bottom number)  . Stomach pain under your ribs in the middle or right side . Headaches or seeing spots . Feeling nauseated or throwing up . Swelling in face and hands  Zone 3: EMERGENCY  Seek immediate medical care if you have any of the following:  . BP reading is greater than160 (top number) or greater than 110 (bottom number) . Severe headaches not improving with Tylenol . Serious difficulty catching your breath . Any worsening symptoms from Zone 2   Third Trimester of Pregnancy The third trimester is from week 29 through week 42, months 7 through 9. The third trimester is a time when the fetus is growing rapidly. At the end of the ninth month, the fetus is about 20 inches in length and weighs 6-10 pounds.  BODY CHANGES Your body goes through many changes during pregnancy. The changes vary from woman to woman.   Your weight will continue to increase. You can expect to gain 25-35 pounds (11-16 kg) by the end of the pregnancy.  You may begin to get stretch marks on your hips, abdomen, and breasts.  You may urinate more often because the fetus is moving lower into your pelvis and pressing on your bladder.  You may develop or continue to have heartburn as a result of your pregnancy.  You may develop constipation because certain hormones are causing the muscles that push waste through your intestines to  slow down.  You may develop hemorrhoids or swollen, bulging veins (varicose veins).  You may have pelvic pain because of the weight gain and pregnancy hormones relaxing your joints between the bones in your pelvis. Backaches may result from overexertion of the muscles supporting your posture.  You may have changes in your hair. These can include thickening of your hair, rapid growth, and changes in texture. Some women also have hair loss during or after pregnancy, or hair that feels dry or thin. Your hair will most likely return to normal after your baby is born.  Your breasts will continue to grow and be tender. A yellow discharge may leak from your breasts called colostrum.  Your belly button may stick out.  You may feel short of breath because of your expanding uterus.  You may notice the fetus "dropping," or moving lower in your abdomen.  You may have a bloody mucus discharge. This usually occurs a few days to a week before labor begins.  Your cervix becomes thin and soft (effaced) near your due date. WHAT TO EXPECT AT YOUR PRENATAL EXAMS  You will have prenatal exams every 2 weeks until week 36. Then, you will have weekly prenatal exams. During a routine prenatal visit:  You will be weighed to make sure you and the fetus are growing normally.  Your blood pressure is taken.  Your abdomen will be measured to track your baby's growth.  The fetal heartbeat will be listened to.  Any test results from the previous visit will be discussed.  You may have a cervical check near your due date to see if you have effaced. At around 36 weeks, your caregiver will check your cervix. At the same time, your caregiver will also perform a test on the secretions of the vaginal tissue. This test is to determine if a type of bacteria, Group B streptococcus, is present. Your caregiver will explain this further. Your caregiver may ask you:  What your birth plan is.  How you are feeling.  If you  are feeling the baby move.  If you have had any abnormal symptoms, such as leaking fluid, bleeding, severe headaches, or abdominal cramping.  If you have any questions. Other tests or screenings that may be performed during your third trimester include:  Blood tests that check for low iron levels (anemia).  Fetal testing to check the health, activity level, and growth of the fetus. Testing is done if you have certain medical conditions or if there are problems during the pregnancy. FALSE LABOR You may feel small, irregular contractions that eventually go away. These are called Braxton Hicks contractions, or false labor. Contractions may last for hours, days, or even weeks before true labor sets in. If contractions come at regular intervals, intensify, or become painful, it is best to be seen by your caregiver.  SIGNS OF LABOR   Menstrual-like cramps.  Contractions that are 5 minutes apart or less.  Contractions that start on the top of the uterus and spread down to the lower abdomen and back.  A sense of increased pelvic pressure or back pain.  A watery or bloody mucus discharge that comes from the vagina. If you have any of these signs before the 37th week of pregnancy, call your caregiver right away. You need to go to the hospital to get checked immediately. HOME CARE INSTRUCTIONS   Avoid all smoking, herbs, alcohol, and unprescribed drugs. These chemicals affect the formation and growth of the baby.  Follow your caregiver's instructions regarding medicine use. There are medicines that are either safe or unsafe to take during pregnancy.  Exercise only as directed by your caregiver. Experiencing uterine cramps is a good sign to stop exercising.  Continue to eat regular, healthy meals.  Wear a good support bra for breast tenderness.  Do not use hot tubs, steam rooms, or saunas.  Wear your seat belt at all times when driving.  Avoid raw meat, uncooked cheese, cat litter boxes,  and soil used by cats. These carry germs that can cause birth defects in the baby.  Take your prenatal vitamins.  Try taking a stool softener (if your caregiver approves) if you develop constipation. Eat more high-fiber foods, such as fresh vegetables or  fruit and whole grains. Drink plenty of fluids to keep your urine clear or pale yellow.  Take warm sitz baths to soothe any pain or discomfort caused by hemorrhoids. Use hemorrhoid cream if your caregiver approves.  If you develop varicose veins, wear support hose. Elevate your feet for 15 minutes, 3-4 times a day. Limit salt in your diet.  Avoid heavy lifting, wear low heal shoes, and practice good posture.  Rest a lot with your legs elevated if you have leg cramps or low back pain.  Visit your dentist if you have not gone during your pregnancy. Use a soft toothbrush to brush your teeth and be gentle when you floss.  A sexual relationship may be continued unless your caregiver directs you otherwise.  Do not travel far distances unless it is absolutely necessary and only with the approval of your caregiver.  Take prenatal classes to understand, practice, and ask questions about the labor and delivery.  Make a trial run to the hospital.  Pack your hospital bag.  Prepare the baby's nursery.  Continue to go to all your prenatal visits as directed by your caregiver. SEEK MEDICAL CARE IF:  You are unsure if you are in labor or if your water has broken.  You have dizziness.  You have mild pelvic cramps, pelvic pressure, or nagging pain in your abdominal area.  You have persistent nausea, vomiting, or diarrhea.  You have a bad smelling vaginal discharge.  You have pain with urination. SEEK IMMEDIATE MEDICAL CARE IF:   You have a fever.  You are leaking fluid from your vagina.  You have spotting or bleeding from your vagina.  You have severe abdominal cramping or pain.  You have rapid weight loss or gain.  You have  shortness of breath with chest pain.  You notice sudden or extreme swelling of your face, hands, ankles, feet, or legs.  You have not felt your baby move in over an hour.  You have severe headaches that do not go away with medicine.  You have vision changes. Document Released: 12/26/2000 Document Revised: 01/06/2013 Document Reviewed: 03/04/2012 [facility] Patient Information 2015 [place], [place]. This information is not intended to replace advice given to you by your health care provider. Make sure you discuss any questions you have with your health care provider.  PROTECT YOURSELF & YOUR BABY FROM THE FLU! Because you are pregnant, we at [facility], along with the Centers for Disease Control (CDC), recommend that you receive the flu vaccine to protect yourself and your baby from the flu. The flu is more likely to cause severe illness in pregnant women than in women of reproductive age who are not pregnant. Changes in the immune system, heart, and lungs during pregnancy make pregnant women (and women up to two weeks postpartum) more prone to severe illness from flu, including illness resulting in hospitalization. Flu also may be harmful for a pregnant woman's developing baby. A common flu symptom is fever, which may be associated with neural tube defects and other adverse outcomes for a developing baby. Getting vaccinated can also help protect a baby after birth from flu. (Mom passes antibodies onto the developing baby during her pregnancy.)  A Flu Vaccine is the Best Protection Against Flu Getting a flu vaccine is the first and most important step in protecting against flu. Pregnant women should get a flu shot and not the live attenuated influenza vaccine (LAIV), also known as nasal spray flu vaccine. Flu vaccines given during pregnancy help  protect both the mother and her baby from flu. Vaccination has been shown to reduce the risk of flu-associated acute respiratory infection in pregnant women by  up to one-half. A 2018 study showed that getting a flu shot reduced a pregnant woman's risk of being hospitalized with flu by an average of 40 percent. Pregnant women who get a flu vaccine are also helping to protect their babies from flu illness for the first several months after their birth, when they are too young to get vaccinated.   A Long Record of Safety for Flu Shots in Pregnant Women Flu shots have been given to millions of pregnant women over many years with a good safety record. There is a lot of evidence that flu vaccines can be given safely during pregnancy; though these data are limited for the first trimester. The CDC recommends that pregnant women get vaccinated during any trimester of their pregnancy. It is very important for pregnant women to get the flu shot.   Other Preventive Actions In addition to getting a flu shot, pregnant women should take the same everyday preventive actions the CDC recommends of everyone, including covering coughs, washing hands often, and avoiding people who are sick.  Symptoms and Treatment If you get sick with flu symptoms call your doctor right away. There are antiviral drugs that can treat flu illness and prevent serious flu complications. The CDC recommends prompt treatment for people who have influenza infection or suspected influenza infection and who are at high risk of serious flu complications, such as people with asthma, diabetes (including gestational diabetes), or heart disease. Early treatment of influenza in hospitalized pregnant women has been shown to reduce the length of the hospital stay.  Symptoms Flu symptoms include fever, cough, sore throat, runny or stuffy nose, body aches, headache, chills and fatigue. Some people may also have vomiting and diarrhea. People may be infected with the flu and have respiratory symptoms without a fever.  Early Treatment is Important for Pregnant Women Treatment should begin as soon as possible because  antiviral drugs work best when started early (within 48 hours after symptoms start). Antiviral drugs can make your flu illness milder and make you feel better faster. They may also prevent serious health problems that can result from flu illness. Oral oseltamivir (Tamiflu) is the preferred treatment for pregnant women because it has the most studies available to suggest that it is safe and beneficial. Antiviral drugs require a prescription from your provider. Having a fever caused by flu infection or other infections early in pregnancy may be linked to birth defects in a baby. In addition to taking antiviral drugs, pregnant women who get a fever should treat their fever with Tylenol (acetaminophen) and contact their provider immediately.  When to Seek Emergency Medical Care If you are pregnant and have any of these signs, seek care immediately:  Difficulty breathing or shortness of breath  Pain or pressure in the chest or abdomen  Sudden dizziness  Confusion  Severe or persistent vomiting  High fever that is not responding to Tylenol (or store brand equivalent)  Decreased or no movement of your baby  [email or web address]

## 2019-05-04 NOTE — Progress Notes (Signed)
INITIAL OBSTETRICAL VISIT Patient name: Sara Carpenter MRN [id number]  Date of birth: [birth year] Chief Complaint:   Initial Prenatal Visit (PN2, U/S)  History of Present Illness:   KEARIA YIN is a 29 y.o. G76P1021 Caucasian female at [redacted]w[redacted]d by 10wk u/s, with an Estimated Date of Delivery: 08/14/19 being seen today for her initial obstetrical visit with [place], she is transferring from [place].   Her obstetrical history is significant for SAB x 2, then term uncomplicated SVB x 1 @ [place].   Today she reports bp's have been high since early pregnancy. Bumps in vulvar area x 37mth. 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  Depression screen [facility] 2/9 05/04/2019  Decreased Interest 0  Down, Depressed, Hopeless 0  PHQ - 2 Score 0  Altered sleeping 0  Tired, decreased energy 0  Change in appetite 0  Feeling bad or failure about yourself  0  Trouble concentrating 0  Moving slowly or fidgety/restless 0  Suicidal thoughts 0  PHQ-9 Score 0  Difficult doing work/chores Not difficult at all    Patient's last menstrual period was 10/21/2018. Last pap 04/09/17. Results were: abnormal and has been avoiding f/u, states mom had cervical cancer Review of Systems:   Pertinent items are noted in HPI Denies cramping/contractions, leakage of fluid, vaginal bleeding, abnormal vaginal discharge w/ itching/odor/irritation, headaches, visual changes, shortness of breath, chest pain, abdominal pain, severe nausea/vomiting, or problems with urination or bowel movements unless otherwise stated above.  Pertinent History Reviewed:  Reviewed past medical,surgical, social, obstetrical and family history.  Reviewed problem list, medications and allergies. OB History  Gravida Para Term Preterm AB Living  4 1 1   2 1   SAB TAB Ectopic Multiple Live Births  2       1    # Outcome Date GA Lbr Len/2nd Weight Sex Delivery Anes PTL Lv  4 Current             3 Term 05/14/14 [redacted]w[redacted]d  8 lb 7 oz (3.827 kg) M Vag-Spont EPI N LIV  2 SAB           1 SAB            Physical Assessment:   Vitals:   05/04/19 1108  BP: (!) 131/92  Weight: (!) 331 lb 3.2 oz (150.2 kg)  Body mass index is 51.87 kg/m.       Physical Examination:  General appearance - well appearing, and in no distress  Mental status - alert, oriented to person, place, and time  Psych:  She has a normal mood and affect  Skin - warm and dry, normal color, no suspicious lesions noted  Chest - effort normal, all lung fields clear to auscultation bilaterally  Heart - normal rate and regular rhythm  Abdomen - soft, nontender  Extremities:  No swelling or varicosities noted  Pelvic - VULVA: 3 small soft round flesh colored bumps Lt labia, non-tender. Skin appears peeling on bumps. VAGINA: normal appearing vagina with normal color and discharge, no lesions  CERVIX: normal appearing cervix without discharge or lesions, no CMT  Thin prep pap is done w/ HR HPV cotesting      TODAY'S ANATOMY U/S: [place] 25+3 wks,breech,anterior placenta gr 0,normal ovaries,cx 4.5 cm,svp of fluid 4.7 cm, fhr 148 bpm,efw 893 g 68 %,anatomy complete,limited view because of pt body habitus,no obvious abnormalities   Results for orders placed or performed in  visit on 05/04/19 (from the past 24 hour(s))  POC Urinalysis Dipstick OB   Collection Time: 05/04/19 12:14 PM  Result Value Ref Range   Color, UA     Clarity, UA     Glucose, UA Negative Negative   Bilirubin, UA     Ketones, UA neg    Spec Grav, UA     Blood, UA neg    pH, UA     POC,PROTEIN,UA Trace Negative, Trace, Small (1+), Moderate (2+), Large (3+), 4+   Urobilinogen, UA     Nitrite, UA neg    Leukocytes, UA Negative Negative   Appearance     Odor      Assessment & Plan:  1) Low-Risk Pregnancy [id number] at [redacted]w[redacted]d with an Estimated Date of Delivery: 08/14/19   2) Initial OB visit  3) CHTN>dx today, has been high since early pregnancy, start  ASA, get baseline labs, reviewed pre-e s/s  4) H/O abnormal pap> w/o f/u, pap done today  5) 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 Blood Pressure Monitor MISC    Sig: For regular home bp monitoring during pregnancy    Dispense:  1 each    Refill:  0    Z34.80    Initial labs obtained Continue prenatal vitamins Reviewed n/v relief measures and warning s/s to report Reviewed recommended weight gain based on pre-gravid BMI Encouraged well-balanced diet Genetic & carrier screening discussed: declines Panorama and Horizon 14  Ultrasound discussed; fetal survey: results reviewed CCNC completed> form faxed if has or is planning to apply for medicaid The nature of [facility] [employer] with multiple MDs and other Advanced Practice Providers was explained to patient; also emphasized that fellows, residents, and students are part of our team.  Follow-up: Return in about 4 weeks (around 06/01/2019) for HROB, US:EFW, in person, MD or CNM.   Orders Placed This Encounter  Procedures  . Urine Culture  . US OB Follow Up  . Glucose Tolerance, 2 Hours w/1 Hour  . Pain Management Screening Profile (10S)  . Obstetric Panel, Including HIV  . Hepatitis C antibody  . Hgb Fractionation Cascade  . Comprehensive metabolic panel  . Protein / creatinine ratio, urine  . POC Urinalysis Dipstick OB    Cheral Marker CNM, [facility] 05/04/2019 1:17 PM

## 2019-05-05 ENCOUNTER — Other Ambulatory Visit: Payer: Self-pay | Admitting: Women's Health

## 2019-05-05 ENCOUNTER — Encounter: Payer: Self-pay | Admitting: Women's Health

## 2019-05-05 DIAGNOSIS — F1290 Cannabis use, unspecified, uncomplicated: Secondary | ICD-10-CM | POA: Insufficient documentation

## 2019-05-05 LAB — PMP SCREEN PROFILE (10S), URINE
Amphetamine Scrn, Ur: NEGATIVE ng/mL
BARBITURATE SCREEN URINE: NEGATIVE ng/mL
BENZODIAZEPINE SCREEN, URINE: NEGATIVE ng/mL
CANNABINOIDS UR QL SCN: POSITIVE ng/mL — AB
Cocaine (Metab) Scrn, Ur: NEGATIVE ng/mL
Creatinine(Crt), U: 274.1 mg/dL (ref 20.0–300.0)
Methadone Screen, Urine: NEGATIVE ng/mL
OXYCODONE+OXYMORPHONE UR QL SCN: NEGATIVE ng/mL
Opiate Scrn, Ur: NEGATIVE ng/mL
Ph of Urine: 6.4 (ref 4.5–8.9)
Phencyclidine Qn, Ur: NEGATIVE ng/mL
Propoxyphene Scrn, Ur: NEGATIVE ng/mL

## 2019-05-05 LAB — GLUCOSE TOLERANCE, 2 HOURS W/ 1HR
Glucose, 1 hour: 120 mg/dL (ref 65–179)
Glucose, 2 hour: 104 mg/dL (ref 65–152)
Glucose, Fasting: 87 mg/dL (ref 65–91)

## 2019-05-05 LAB — SPECIMEN STATUS REPORT

## 2019-05-05 MED ORDER — PANTOPRAZOLE SODIUM 20 MG PO TBEC: 20.0000 mg | DELAYED_RELEASE_TABLET | Freq: Every day | ORAL | 6 refills | Status: DC

## 2019-05-06 LAB — URINE CULTURE

## 2019-05-06 LAB — OBSTETRIC PANEL, INCLUDING HIV
Antibody Screen: NEGATIVE
Basophils Absolute: 0.1 10*3/uL (ref 0.0–0.2)
Basos: 1 %
EOS (ABSOLUTE): 0.3 10*3/uL (ref 0.0–0.4)
Eos: 2 %
HIV Screen 4th Generation wRfx: NONREACTIVE
Hematocrit: 35.1 % (ref 34.0–46.6)
Hemoglobin: 11.7 g/dL (ref 11.1–15.9)
Hepatitis B Surface Ag: NEGATIVE
Immature Grans (Abs): 0.2 10*3/uL — ABNORMAL HIGH (ref 0.0–0.1)
Immature Granulocytes: 1 %
Lymphocytes Absolute: 2.1 10*3/uL (ref 0.7–3.1)
Lymphs: 15 %
MCH: 30.5 pg (ref 26.6–33.0)
MCHC: 33.3 g/dL (ref 31.5–35.7)
MCV: 92 fL (ref 79–97)
Monocytes Absolute: 0.7 10*3/uL (ref 0.1–0.9)
Monocytes: 5 %
Neutrophils Absolute: 11 10*3/uL — ABNORMAL HIGH (ref 1.4–7.0)
Neutrophils: 76 %
Platelets: 342 10*3/uL (ref 150–450)
RBC: 3.83 x10E6/uL (ref 3.77–5.28)
RDW: 13.2 % (ref 11.7–15.4)
RPR Ser Ql: NONREACTIVE
Rh Factor: POSITIVE
Rubella Antibodies, IGG: 1.93 index (ref >0.99)
WBC: 14.4 10*3/uL — ABNORMAL HIGH (ref 3.4–10.8)

## 2019-05-06 LAB — HGB FRACTIONATION CASCADE
Hgb A2: 2.9 % (ref 1.8–3.2)
Hgb A: 97.1 % (ref 96.4–98.8)
Hgb F: 0 % (ref 0.0–2.0)
Hgb S: 0 %

## 2019-05-06 LAB — HEPATITIS C ANTIBODY: Hep C Virus Ab: <0.1 s/co ratio (ref 0.0–0.9)

## 2019-05-07 LAB — COMPREHENSIVE METABOLIC PANEL
ALT: 5 IU/L (ref 0–32)
AST: 10 IU/L (ref 0–40)
Albumin/Globulin Ratio: 1.4 (ref 1.2–2.2)
Albumin: 3.4 g/dL — ABNORMAL LOW (ref 3.9–5.0)
Alkaline Phosphatase: 76 IU/L (ref 39–117)
BUN/Creatinine Ratio: 7 — ABNORMAL LOW (ref 9–23)
BUN: 4 mg/dL — ABNORMAL LOW (ref 6–20)
Bilirubin Total: <0.2 mg/dL (ref 0.0–1.2)
CO2: 17 mmol/L — ABNORMAL LOW (ref 20–29)
Calcium: 9 mg/dL (ref 8.7–10.2)
Chloride: 105 mmol/L (ref 96–106)
Creatinine, Ser: 0.55 mg/dL — ABNORMAL LOW (ref 0.57–1.00)
GFR calc Af Amer: 149 mL/min/{1.73_m2} (ref >59)
GFR calc non Af Amer: 129 mL/min/{1.73_m2} (ref >59)
Globulin, Total: 2.4 g/dL (ref 1.5–4.5)
Glucose: 110 mg/dL — ABNORMAL HIGH (ref 65–99)
Potassium: 3.9 mmol/L (ref 3.5–5.2)
Sodium: 136 mmol/L (ref 134–144)
Total Protein: 5.8 g/dL — ABNORMAL LOW (ref 6.0–8.5)

## 2019-05-07 LAB — CYTOLOGY - PAP
Chlamydia: NEGATIVE
Comment: NEGATIVE
Comment: NEGATIVE
Comment: NEGATIVE
Comment: NEGATIVE
Comment: NORMAL
HPV 16: NEGATIVE
HPV 18 / 45: POSITIVE — AB
High risk HPV: POSITIVE — AB
Neisseria Gonorrhea: NEGATIVE

## 2019-05-07 LAB — SPECIMEN STATUS REPORT

## 2019-05-11 ENCOUNTER — Encounter: Payer: Self-pay | Admitting: Women's Health

## 2019-05-11 ENCOUNTER — Other Ambulatory Visit: Payer: Self-pay | Admitting: Women's Health

## 2019-05-11 DIAGNOSIS — O0990 Supervision of high risk pregnancy, unspecified, unspecified trimester: Secondary | ICD-10-CM

## 2019-05-11 DIAGNOSIS — R87619 Unspecified abnormal cytological findings in specimens from cervix uteri: Secondary | ICD-10-CM | POA: Insufficient documentation

## 2019-05-11 DIAGNOSIS — R8271 Bacteriuria: Secondary | ICD-10-CM

## 2019-05-11 MED ORDER — AMOXICILLIN 500 MG PO CAPS: 500.0000 mg | ORAL_CAPSULE | Freq: Two times a day (BID) | ORAL | 0 refills | Status: DC

## 2019-05-28 ENCOUNTER — Telehealth: Payer: Self-pay | Admitting: Women's Health

## 2019-05-28 ENCOUNTER — Telehealth: Payer: Self-pay | Admitting: *Deleted

## 2019-05-28 NOTE — Telephone Encounter (Signed)
Pt called and stated that her BP has been elevated this afternoon. About an hour ago it was 153/89, then 30 min ago it was 145/85. She denies headaches. She is not currently on any meds for BP but takes baby aspirin. Pt rechecked BP while we were on the phone and go 150/88. She reports that she feels like her heart is beating fast, it was 120 per BP monitor. She feels tired. Advised patient I would discuss with a provider and call her back before I leave today.

## 2019-05-28 NOTE — Telephone Encounter (Signed)
Discussed with Joellyn Haff. She recommends that patient check BP twice a day and bring log with her to visit next week. Patient to go to [facility] if she develops headaches, blurry vision or decreased fetal movement. Pt aware and had no other questions at this time.

## 2019-05-28 NOTE — Telephone Encounter (Signed)
error 

## 2019-06-01 ENCOUNTER — Other Ambulatory Visit: Payer: Self-pay

## 2019-06-01 ENCOUNTER — Encounter: Payer: Self-pay | Admitting: Obstetrics & Gynecology

## 2019-06-01 ENCOUNTER — Ambulatory Visit (INDEPENDENT_AMBULATORY_CARE_PROVIDER_SITE_OTHER): Payer: Medicaid Other | Admitting: Obstetrics & Gynecology

## 2019-06-01 ENCOUNTER — Ambulatory Visit (INDEPENDENT_AMBULATORY_CARE_PROVIDER_SITE_OTHER): Payer: Medicaid Other

## 2019-06-01 VITALS — BP 111/75 | HR 105 | Wt 332.5 lb

## 2019-06-01 DIAGNOSIS — O0990 Supervision of high risk pregnancy, unspecified, unspecified trimester: Secondary | ICD-10-CM

## 2019-06-01 DIAGNOSIS — O26893 Other specified pregnancy related conditions, third trimester: Secondary | ICD-10-CM

## 2019-06-01 DIAGNOSIS — O1093 Unspecified pre-existing hypertension complicating the puerperium: Secondary | ICD-10-CM

## 2019-06-01 DIAGNOSIS — Z1389 Encounter for screening for other disorder: Secondary | ICD-10-CM

## 2019-06-01 DIAGNOSIS — O0993 Supervision of high risk pregnancy, unspecified, third trimester: Secondary | ICD-10-CM

## 2019-06-01 DIAGNOSIS — O2343 Unspecified infection of urinary tract in pregnancy, third trimester: Secondary | ICD-10-CM

## 2019-06-01 DIAGNOSIS — O10919 Unspecified pre-existing hypertension complicating pregnancy, unspecified trimester: Secondary | ICD-10-CM

## 2019-06-01 DIAGNOSIS — R87612 Low grade squamous intraepithelial lesion on cytologic smear of cervix (LGSIL): Secondary | ICD-10-CM | POA: Diagnosis not present

## 2019-06-01 DIAGNOSIS — R896 Abnormal cytological findings in specimens from other organs, systems and tissues: Secondary | ICD-10-CM

## 2019-06-01 DIAGNOSIS — O10913 Unspecified pre-existing hypertension complicating pregnancy, third trimester: Secondary | ICD-10-CM

## 2019-06-01 DIAGNOSIS — O0992 Supervision of high risk pregnancy, unspecified, second trimester: Secondary | ICD-10-CM

## 2019-06-01 DIAGNOSIS — Z3A29 29 weeks gestation of pregnancy: Secondary | ICD-10-CM

## 2019-06-01 DIAGNOSIS — R87810 Cervical high risk human papillomavirus (HPV) DNA test positive: Secondary | ICD-10-CM

## 2019-06-01 DIAGNOSIS — O3663X0 Maternal care for excessive fetal growth, third trimester, not applicable or unspecified: Secondary | ICD-10-CM

## 2019-06-01 DIAGNOSIS — Z331 Pregnant state, incidental: Secondary | ICD-10-CM

## 2019-06-01 LAB — POCT URINALYSIS DIPSTICK OB
Glucose, UA: NEGATIVE
Nitrite, UA: NEGATIVE

## 2019-06-01 NOTE — Progress Notes (Signed)
[place] 29+3 wks,breech,cx 3.6 cm,anterior placenta gr 2,AFI 16.8 cm,fhr 138 bpm,EFW 1796 g 96%, AC 99%

## 2019-06-01 NOTE — Progress Notes (Signed)
HIGH-RISK PREGNANCY VISIT Patient name: Sara Carpenter MRN [id number]  Date of birth: 05-Jul-1990 Chief Complaint:   High Risk Gestation ([place] & colpo; LGSIL pap; + high risk HPV; + HPV 18/45)  History of Present Illness:   Sara Carpenter is a 29 y.o. [id number] female at [redacted]w[redacted]d with an Estimated Date of Delivery: 08/14/19 being seen today for ongoing management of a high-risk pregnancy complicated by [facility], no meds, HSV positive serology.  Today she reports no complaints.  Depression screen [facility] 2/9 05/04/2019  Decreased Interest 0  Down, Depressed, Hopeless 0  PHQ - 2 Score 0  Altered sleeping 0  Tired, decreased energy 0  Change in appetite 0  Feeling bad or failure about yourself  0  Trouble concentrating 0  Moving slowly or fidgety/restless 0  Suicidal thoughts 0  PHQ-9 Score 0  Difficult doing work/chores Not difficult at all    Contractions: Not present. Vag. Bleeding: None.  Movement: Present. denies leaking of fluid.  Review of Systems:   Pertinent items are noted in HPI Denies abnormal vaginal discharge w/ itching/odor/irritation, headaches, visual changes, shortness of breath, chest pain, abdominal pain, severe nausea/vomiting, or problems with urination or bowel movements unless otherwise stated above. Pertinent History Reviewed:  Reviewed past medical,surgical, social, obstetrical and family history.  Reviewed problem list, medications and allergies. Physical Assessment:   Vitals:   06/01/19 0949  BP: 111/75  Pulse: (!) 105  Weight: (!) 332 lb 8 oz (150.8 kg)  Body mass index is 52.08 kg/m.           Physical Examination:   General appearance: alert, well appearing, and in no distress  Mental status: alert, oriented to person, place, and time  Skin: warm & dry   Extremities: Edema: None    Cardiovascular: normal heart rate noted  Respiratory: normal respiratory effort, no distress  Abdomen: gravid, soft, non-tender  Pelvic: Cervical exam deferred          Fetal Status:     Movement: Present    Fetal Surveillance Testing today: sonogram   Chaperone: Levy Pupa    Results for orders placed or performed in visit on 06/01/19 (from the past 24 hour(s))  POC Urinalysis Dipstick OB   Collection Time: 06/01/19  9:50 AM  Result Value Ref Range   Color, UA     Clarity, UA     Glucose, UA Negative Negative   Bilirubin, UA     Ketones, UA 1+    Spec Grav, UA     Blood, UA trace    pH, UA     POC,PROTEIN,UA Trace Negative, Trace, Small (1+), Moderate (2+), Large (3+), 4+   Urobilinogen, UA     Nitrite, UA neg    Leukocytes, UA Moderate (2+) (A) Negative   Appearance     Odor      Assessment & Plan:  1) High-risk pregnancy [id number] at [redacted]w[redacted]d with an Estimated Date of Delivery: 08/14/19   2) CHTN, stable, no meds  3) LGA fetus, stable, normal glucola testing  Meds: No orders of the defined types were placed in this encounter.   Labs/procedures today: colposcopy  Colposcopy Procedure Note:  Colposcopy Procedure Note  Indications:  2021 LSIL/+18/45 HR HPV   2019 ASCCP recommendation: colposcopy  Smoker:  No. New sexual partner:  No.  : time frame:  No.  History of abnormal Pap: yes  Procedure Details  The risks and benefits of the procedure and Written informed consent obtained.  Speculum placed in vagina and excellent visualization of cervix achieved, cervix swabbed x 3 with acetic acid solution.  Findings: Cervix: no visible lesions, no mosaicism, no punctation and no abnormal vasculature; SCJ visualized 360 degrees without lesions and no biopsies taken. Vaginal inspection: normal without visible lesions. Vulvar colposcopy: vulvar colposcopy not performed.  Specimens: none today  Complications: none.  Plan(Based on 2019 ASCCP recommendations) Repeat HPV based cytology in 1year     Treatment Plan:  Follow as a CHTN, no med patient, weekly BPP begininning at 32 weeks, if requires meds go to twice weekly  testing  Reviewed: Preterm labor symptoms and general obstetric precautions including but not limited to vaginal bleeding, contractions, leaking of fluid and fetal movement were reviewed in detail with the patient.  All questions were answered. Has home bp cuff. Rx faxed to . Check bp weekly, let us know if >140/90.   Follow-up: Return in about 2 weeks (around 06/15/2019) for HROB.  Orders Placed This Encounter  Procedures  . Urine Culture  . POC Urinalysis Dipstick OB   Sara Carpenter  06/01/2019 10:18 AM

## 2019-06-02 ENCOUNTER — Other Ambulatory Visit: Payer: Self-pay | Admitting: Women's Health

## 2019-06-02 MED ORDER — TERCONAZOLE 0.4 % VA CREA: 1.0000 | TOPICAL_CREAM | Freq: Every day | VAGINAL | 0 refills | Status: DC

## 2019-06-03 ENCOUNTER — Other Ambulatory Visit: Payer: Self-pay

## 2019-06-03 ENCOUNTER — Inpatient Hospital Stay (HOSPITAL_COMMUNITY)
Admission: AD | Admit: 2019-06-03 | Discharge: 2019-06-04 | Disposition: A | Discharge status: Home / Self Care | Payer: Medicaid Other | Attending: Obstetrics and Gynecology | Admitting: Obstetrics and Gynecology

## 2019-06-03 ENCOUNTER — Encounter (HOSPITAL_COMMUNITY): Payer: Self-pay | Admitting: Obstetrics and Gynecology

## 2019-06-03 DIAGNOSIS — F319 Bipolar disorder, unspecified: Secondary | ICD-10-CM | POA: Insufficient documentation

## 2019-06-03 DIAGNOSIS — O10919 Unspecified pre-existing hypertension complicating pregnancy, unspecified trimester: Secondary | ICD-10-CM

## 2019-06-03 DIAGNOSIS — O99343 Other mental disorders complicating pregnancy, third trimester: Secondary | ICD-10-CM | POA: Insufficient documentation

## 2019-06-03 DIAGNOSIS — G43809 Other migraine, not intractable, without status migrainosus: Secondary | ICD-10-CM | POA: Insufficient documentation

## 2019-06-03 DIAGNOSIS — Z7982 Long term (current) use of aspirin: Secondary | ICD-10-CM | POA: Insufficient documentation

## 2019-06-03 DIAGNOSIS — K209 Esophagitis, unspecified without bleeding: Secondary | ICD-10-CM | POA: Diagnosis not present

## 2019-06-03 DIAGNOSIS — Z79899 Other long term (current) drug therapy: Secondary | ICD-10-CM | POA: Diagnosis not present

## 2019-06-03 DIAGNOSIS — O26893 Other specified pregnancy related conditions, third trimester: Secondary | ICD-10-CM | POA: Insufficient documentation

## 2019-06-03 DIAGNOSIS — Z3A29 29 weeks gestation of pregnancy: Secondary | ICD-10-CM | POA: Insufficient documentation

## 2019-06-03 DIAGNOSIS — O99613 Diseases of the digestive system complicating pregnancy, third trimester: Secondary | ICD-10-CM | POA: Diagnosis not present

## 2019-06-03 DIAGNOSIS — R12 Heartburn: Secondary | ICD-10-CM | POA: Insufficient documentation

## 2019-06-03 DIAGNOSIS — Z87891 Personal history of nicotine dependence: Secondary | ICD-10-CM | POA: Insufficient documentation

## 2019-06-03 DIAGNOSIS — R112 Nausea with vomiting, unspecified: Secondary | ICD-10-CM | POA: Diagnosis not present

## 2019-06-03 DIAGNOSIS — O0990 Supervision of high risk pregnancy, unspecified, unspecified trimester: Secondary | ICD-10-CM

## 2019-06-03 DIAGNOSIS — E669 Obesity, unspecified: Secondary | ICD-10-CM | POA: Insufficient documentation

## 2019-06-03 DIAGNOSIS — O212 Late vomiting of pregnancy: Secondary | ICD-10-CM | POA: Insufficient documentation

## 2019-06-03 DIAGNOSIS — O10913 Unspecified pre-existing hypertension complicating pregnancy, third trimester: Secondary | ICD-10-CM

## 2019-06-03 DIAGNOSIS — O99213 Obesity complicating pregnancy, third trimester: Secondary | ICD-10-CM | POA: Diagnosis not present

## 2019-06-03 DIAGNOSIS — O10013 Pre-existing essential hypertension complicating pregnancy, third trimester: Secondary | ICD-10-CM | POA: Insufficient documentation

## 2019-06-03 DIAGNOSIS — O99353 Diseases of the nervous system complicating pregnancy, third trimester: Secondary | ICD-10-CM | POA: Diagnosis not present

## 2019-06-03 LAB — URINALYSIS, ROUTINE W REFLEX MICROSCOPIC
Bilirubin Urine: NEGATIVE
Glucose, UA: NEGATIVE mg/dL
Hgb urine dipstick: NEGATIVE
Ketones, ur: NEGATIVE mg/dL
Nitrite: NEGATIVE
Protein, ur: 100 mg/dL — AB
Specific Gravity, Urine: 1.032 — ABNORMAL HIGH (ref 1.005–1.030)
pH: 5 (ref 5.0–8.0)

## 2019-06-03 LAB — URINE CULTURE

## 2019-06-03 MED ORDER — SUCRALFATE 1 GM/10ML PO SUSP
1.0000 g | Freq: Once | ORAL | Status: AC
  Administered 2019-06-03: 1 g via ORAL
  Filled 2019-06-03: qty 10

## 2019-06-03 MED ORDER — LACTATED RINGERS IV BOLUS
1000.0000 mL | Freq: Once | INTRAVENOUS | Status: AC
  Administered 2019-06-03: 1000 mL via INTRAVENOUS

## 2019-06-03 MED ORDER — SUCRALFATE 1 GM/10ML PO SUSP: 1.0000 g | Freq: Three times a day (TID) | ORAL | 0 refills | Status: DC | PRN

## 2019-06-03 MED ORDER — ONDANSETRON HCL 4 MG/2ML IJ SOLN
4.0000 mg | Freq: Once | INTRAMUSCULAR | Status: AC
  Administered 2019-06-03: 4 mg via INTRAVENOUS
  Filled 2019-06-03: qty 2

## 2019-06-03 MED ORDER — PANTOPRAZOLE SODIUM 20 MG PO TBEC: 20.0000 mg | DELAYED_RELEASE_TABLET | Freq: Every day | ORAL | 0 refills | Status: DC

## 2019-06-03 MED ORDER — PANTOPRAZOLE SODIUM 40 MG IV SOLR
40.0000 mg | Freq: Once | INTRAVENOUS | Status: AC
  Administered 2019-06-03: 40 mg via INTRAVENOUS
  Filled 2019-06-03: qty 40

## 2019-06-03 NOTE — Discharge Instructions (Signed)
Esophagitis  Esophagitis is inflammation of the esophagus. The esophagus is the tube that carries food from your mouth to your stomach. Esophagitis can cause soreness or pain in the esophagus. This condition can make it difficult and painful to swallow. What are the causes? Most causes of esophagitis are not serious. Common causes of this condition include:  Gastroesophageal reflux disease (GERD). This is when stomach contents move back up into the esophagus (reflux).  Repeated vomiting.  An allergic reaction, especially caused by food allergies (eosinophilic esophagitis).  Injury to the esophagus by swallowing large pills with or without water, or swallowing certain types of medicines.  Swallowing (ingesting) harmful chemicals, such as household cleaning products.  Heavy alcohol use.  An infection of the esophagus. This most often occurs in people who have a weakened immune system.  Radiation or chemotherapy treatment for cancer.  Certain diseases such as sarcoidosis, Crohn's disease, and scleroderma. What are the signs or symptoms? Symptoms of this condition include:  Difficult or painful swallowing.  Pain with swallowing acidic liquids, such as citrus juices.  Pain with burping.  Chest pain.  Difficulty breathing.  Nausea.  Vomiting.  Pain in the abdomen.  Weight loss.  Ulcers in the mouth.  Patches of white material in the mouth (candidiasis).  Fever.  Coughing up blood or vomiting blood.  Stool that is black, tarry, or bright red. How is this diagnosed? Your health care provider will take a medical history and perform a physical exam. You may also have other tests, including:  An endoscopy to examine your esophagus and stomach with a small flexible tube with a camera.  A test that measures the acidity level in your esophagus.  A test that measures how much pressure is on your esophagus.  A barium swallow or modified barium swallow to show the shape,  size, and functioning of your esophagus.  Allergy tests. How is this treated? Treatment for this condition depends on the cause of your esophagitis. In some cases, steroids or other medicines may be given to help relieve your symptoms or to treat the underlying cause of your condition. You may have to make some lifestyle changes, such as:  Avoiding alcohol.  Quitting smoking.  Changing your diet.  Exercising.  Changing your sleep habits and your sleep environment. Follow these instructions at home: Medicines  Take over-the-counter and prescription medicines only as told by your health care provider.  Do not take aspirin, ibuprofen, or other NSAIDs unless your health care provider told you to do so.  If you have trouble taking pills: ? Use a pill splitter to decrease the size of the pill. This will decrease the chance of the pill getting stuck or injuring your esophagus. ? Drink water after you take a pill. Eating and drinking   Avoid foods and drinks that seem to make your symptoms worse.  Follow a diet as recommended by your health care provider. This may involve avoiding foods and drinks such as: ? Coffee and tea (with or without caffeine). ? Drinks that contain alcohol. ? Energy drinks and sports drinks. ? Carbonated drinks or sodas. ? Chocolate and cocoa. ? Peppermint and mint flavorings. ? Garlic and onions. ? Horseradish. ? Spicy and acidic foods, including peppers, chili powder, curry powder, vinegar, hot sauces, and barbecue sauce. ? Citrus fruit juices and citrus fruits, such as oranges, lemons, and limes. ? Tomato-based foods, such as red sauce, chili, salsa, and pizza with red sauce. ? Fried and fatty foods, such as   donuts, french fries, potato chips, and high-fat dressings. ? High-fat meats, such as hot dogs and fatty cuts of red and white meats, such as rib eye steak, sausage, ham, and bacon. ? High-fat dairy items, such as whole milk, butter, and cream  cheese. Lifestyle  Eat small, frequent meals instead of large meals.  Avoid drinking large amounts of liquid with your meals.  Avoid eating meals during the 2-3 hours before bedtime.  Avoid lying down right after you eat.  Do not exercise right after you eat.  Do not use any products that contain nicotine or tobacco, such as cigarettes and e-cigarettes. If you need help quitting, ask your health care provider. General instructions   Pay attention to any changes in your symptoms. Let your health care provider know about them.  Wear loose-fitting clothing. Do not wear anything tight around your waist that causes pressure on your abdomen.  Raise (elevate) the head of your bed about 6 inches (15 cm).  Try relaxation strategies such as yoga, deep breathing, or meditation to manage stress. If you need help reducing stress, ask your health care provider.  If you are overweight, reduce your weight to an amount that is healthy for you. Ask your health care provider for guidance about a safe weight loss goal.  Keep all follow-up visits as told by your health care provider. This is important. Contact a health care provider if:  You have new symptoms.  You have unexplained weight loss.  You have difficulty swallowing, or it hurts to swallow.  You have wheezing or a cough that does not go away.  Your symptoms do not improve with treatment.  You have frequent heartburn for more than two weeks. Get help right away if:  You have severe pain in your arms, neck, jaw, teeth, or back.  You feel sweaty, dizzy, or light-headed.  You have chest pain or shortness of breath.  You vomit and your vomit looks like blood or coffee grounds.  Your stool is bloody or black.  You have a fever.  You cannot swallow, drink, or eat. Summary  Esophagitis is inflammation of the esophagus.  Most causes of esophagitis are not serious.  Follow your health care provider's instructions about eating  and drinking. Follow instructions on medicines.  Contact a health care provider if you have new symptoms, have weight loss, or coughing that does not stop.  Get help right away if you have severe pain in the arms, neck, jaw, teeth or back, or if you have chest pain, shortness of breath, or fever. This information is not intended to replace advice given to you by your health care provider. Make sure you discuss any questions you have with your health care provider. Document Revised: 08/23/2017 Document Reviewed: 08/23/2017 Elsevier Patient Education  [place].   Hypertension During Pregnancy Hypertension is also called high blood pressure. High blood pressure means that the force of your blood moving in your body is too strong. It can cause problems for you and your baby. Different types of high blood pressure can happen during pregnancy. The types are:  High blood pressure before you got pregnant. This is called chronic hypertension.  This can continue during your pregnancy. Your doctor will want to keep checking your blood pressure. You may need medicine to keep your blood pressure under control while you are pregnant. You will need follow-up visits after you have your baby.  High blood pressure that goes up during pregnancy when it was  normal before. This is called gestational hypertension. It will usually get better after you have your baby, but your doctor will need to watch your blood pressure to make sure that it is getting better.  Very high blood pressure during pregnancy. This is called preeclampsia. Very high blood pressure is an emergency that needs to be checked and treated right away.  You may develop very high blood pressure after giving birth. This is called postpartum preeclampsia. This usually occurs within 48 hours after childbirth but may occur up to 6 weeks after giving birth. This is rare. How does this affect me? If you have high blood pressure during pregnancy,  you have a higher chance of developing high blood pressure:  As you get older.  If you get pregnant again. In some cases, high blood pressure during pregnancy can cause:  Stroke.  Heart attack.  Damage to the kidneys, lungs, or liver.  Preeclampsia.  Jerky movements you cannot control (convulsions or seizures).  Problems with the placenta. How does this affect my baby? Your baby may:  Be born early.  Not weigh as much as he or she should.  Not handle labor well, leading to a c-section birth. What are the risks?  Having high blood pressure during a past pregnancy.  Being overweight.  Being 27 years old or older.  Being pregnant for the first time.  Being pregnant with more than one baby.  Becoming pregnant using fertility methods, such as IVF.  Having other problems, such as diabetes, or kidney disease.  Having family members who have high blood pressure. What can I do to lower my risk?   Keep a healthy weight.  Eat a healthy diet.  Follow what your doctor tells you about treating any medical problems that you had before becoming pregnant. It is very important to go to all of your doctor visits. Your doctor will check your blood pressure and make sure that your pregnancy is progressing as it should. Treatment should start early if a problem is found. How is this treated? Treatment for high blood pressure during pregnancy can differ depending on the type of high blood pressure you have and how serious it is.  You may need to take blood pressure medicine.  If you have been taking medicine for your blood pressure, you may need to change the medicine during pregnancy if it is not safe for your baby.  If your doctor thinks that you could get very high blood pressure, he or she may tell you to take a low-dose aspirin during your pregnancy.  If you have very high blood pressure, you may need to stay in the hospital so you and your baby can be watched closely. You  may also need to take medicine to lower your blood pressure. This medicine may be given by mouth or through an IV tube.  In some cases, if your condition gets worse, you may need to have your baby early. Follow these instructions at home: Eating and drinking   Drink enough fluid to keep your pee (urine) pale yellow.  Avoid caffeine. Lifestyle  Do not use any products that contain nicotine or tobacco, such as cigarettes, e-cigarettes, and chewing tobacco. If you need help quitting, ask your doctor.  Do not use alcohol or drugs.  Avoid stress.  Rest and get plenty of sleep.  Regular exercise can help. Ask your doctor what kinds of exercise are best for you. General instructions  Take over-the-counter and prescription medicines only as  told by your doctor.  Keep all prenatal and follow-up visits as told by your doctor. This is important. Contact a doctor if:  You have symptoms that your doctor told you to watch for, such as: ? Headaches. ? Nausea. ? Vomiting. ? Belly (abdominal) pain. ? Dizziness. ? Light-headedness. Get help right away if:  You have: ? Very bad belly pain that does not get better with treatment. ? A very bad headache that does not get better. ? Vomiting that does not get better. ? Sudden, fast weight gain. ? Sudden swelling in your hands, ankles, or face. ? Bleeding from your vagina. ? Blood in your pee. ? Blurry vision. ? Double vision. ? Shortness of breath. ? Chest pain. ? Weakness on one side of your body. ? Trouble talking.  Your baby is not moving as much as usual. Summary  High blood pressure is also called hypertension.  High blood pressure means that the force of your blood moving in your body is too strong.  High blood pressure can cause problems for you and your baby.  Keep all follow-up visits as told by your doctor. This is important. This information is not intended to replace advice given to you by your health care provider.  Make sure you discuss any questions you have with your health care provider. Document Revised: 04/24/2018 Document Reviewed: 01/28/2018 Elsevier Patient Education  2020 [employer].

## 2019-06-03 NOTE — MAU Provider Note (Addendum)
Chief Complaint:  Hypertension, Headache, and Emesis During Pregnancy   First Provider Initiated Contact with Patient 06/03/19 2108      HPI: Sara Carpenter is a 29 y.o. [id number] at [redacted]w[redacted]d who presents to maternity admissions reporting 7/10 headache nausea, vomiting, worsening heartburn and seeing flecks of blood in the emesis since 7:30 PM.  Headache is consistent with migraine headaches that she has had in the past.  Took 1 g of Tylenol.  No improvement.  Usually takes ibuprofen for her migraines, but did not take it due to pregnancy.  History of CHTN, no meds.  Has not taken anything for nausea or heartburn.  Frequently takes Tums for heartburn but says they do not help.  Location: Generalized headache Quality: Throbbing Severity: 7/10 in pain scale Duration: 1 hour prior to MAU arrival Context: [redacted] weeks gestation.  History of migraines. Timing: Constant Modifying factors: No improvement with Tylenol. Associated signs and symptoms: Nausea, vomiting, heartburn, dizziness.  Denies contractions, leakage of fluid or vaginal bleeding. Good fetal movement.     Past Medical History:  Diagnosis Date  . Anxiety   . Bipolar 1 disorder (HCC)   . Depression    is fine  . Infected tooth   . Infection    UTI  . Obesity   . PCOS (polycystic ovarian syndrome)    "When I was young". No medical intervention.  . Vaginal Pap smear, abnormal    did not f/u   OB History  Gravida Para Term Preterm AB Living  4 1 1   2 1   SAB TAB Ectopic Multiple Live Births  2       1    # Outcome Date GA Lbr Len/2nd Weight Sex Delivery Anes PTL Lv  4 Current           3 Term 05/14/14 [redacted]w[redacted]d  3827 g M Vag-Spont EPI N LIV  2 SAB           1 SAB            Past Surgical History:  Procedure Laterality Date  . DILATION AND CURETTAGE OF UTERUS N/A 09/03/2012   Procedure: SUCTION DILATATION AND CURETTAGE;  Surgeon: 09/05/2012, MD;  Location: [facility];  Service: Gynecology;  Laterality: N/A;  . TONSILLECTOMY      Family History  Problem Relation Age of Onset  . Diabetes Paternal Grandmother   . Diabetes Paternal Grandfather   . Cancer Mother        cervical  . Cancer Paternal Uncle        lung  . Cancer Maternal Grandmother        lung  . Cancer Maternal Grandfather        throat  . Healthy Father   . Kidney disease Sister    Social History   Tobacco Use  . Smoking status: Former Smoker    Packs/day: 0.00    Years: 2.00    Pack years: 0.00    Types: Cigarettes    Quit date: 08/25/2012    Years since quitting: 6.7  . Smokeless tobacco: Never Used  Substance Use Topics  . Alcohol use: Not Currently    Comment: occas  . Drug use: Yes    Types: Marijuana    Comment: Everyday   Allergies  Allergen Reactions  . Carbamazepine Nausea And Vomiting   Medications Prior to Admission  Medication Sig Dispense Refill Last Dose  . acetaminophen (TYLENOL) 500 MG tablet Take 1,000 mg by  mouth every 6 (six) hours as needed.   06/03/2019 at Unknown time  . amoxicillin (AMOXIL) 500 MG capsule Take 1 capsule (500 mg total) by mouth 2 (two) times daily. X 7 days 14 capsule 0 Past Week at Unknown time  . aspirin EC 81 MG tablet Take 162 mg by mouth daily.   06/02/2019 at Unknown time  . Prenatal Vit-DSS-Fe Cbn-FA (PRENATAL AD PO) Take by mouth.   06/02/2019 at Unknown time  . terconazole (TERAZOL 7) 0.4 % vaginal cream Place 1 applicator vaginally at bedtime. 45 g 0 06/02/2019 at Unknown time  . Blood Pressure Monitor MISC For regular home bp monitoring during pregnancy 1 each 0 Unknown at Unknown time    I have reviewed patient's Past Medical Hx, Surgical Hx, Family Hx, Social Hx, medications and allergies.   ROS:  Review of Systems  Constitutional: Negative for chills and fever.  Eyes: Negative for visual disturbance.  Gastrointestinal: Positive for nausea and vomiting. Negative for abdominal pain and diarrhea.       Scant hemoptysis  Genitourinary: Negative for vaginal bleeding.   Musculoskeletal: Negative for neck pain and neck stiffness.  Neurological: Positive for dizziness and headaches. Negative for speech difficulty and numbness.  Psychiatric/Behavioral: Negative for confusion.    Physical Exam   Patient Vitals for the past 24 hrs:  BP Temp Pulse Resp SpO2 Height Weight  06/03/19 2100 128/74 -- (!) 108 -- -- -- --  06/03/19 2038 118/83 98.4 F (36.9 C) (!) 112 20 100 % -- --  06/03/19 2033 -- -- -- -- -- 5\' 7"  (1.702 m) (!) 151 kg   Constitutional: Well-developed, well-nourished female in mild distress.  Actively vomiting. Cardiovascular: Mild tachycardia Respiratory: normal effort GI: Abd soft, non-tender, gravid appropriate for gestational age. MS: Deferred Neurologic: Alert and oriented x 4.  GU: Deferred FHT:  Baseline 140, moderate variability, no accelerations present, no decelerations Contractions: None   MAU Course: Offered patient migraine cocktail, but she does not have a ride.  Think she will feel better if she stops vomiting and get fluids.  Zofran, IV fluids, Protonix and Carafate ordered. Orders Placed This Encounter  Procedures  . Urinalysis, Routine w reflex microscopic  . Insert peripheral IV   Meds ordered this encounter  Medications  . lactated ringers bolus 1,000 mL  . pantoprazole (PROTONIX) injection 40 mg  . ondansetron (ZOFRAN) injection 4 mg  . sucralfate (CARAFATE) 1 GM/10ML suspension 1 g   Care of patient turned over to Hansel Feinstein, CNM at 9:32 PM  Tamala Julian, [place], [place] 06/03/2019 9:31 PM   Got complete relief of esophagitis from Carafate Got complete relief of headache with meds Discharge home Rx Carafate prn esophagitis Rx Protonix for GERD Decided not to Rx Fioricet due to intolerance of related drug HTN precautions reviewed Encouraged to return here or to other Urgent Care/ED if she develops worsening of symptoms, increase in pain, fever, or other concerning symptoms.    Seabron Spates, CNM

## 2019-06-03 NOTE — MAU Note (Addendum)
Hx migraines. About an hour ago head started hurting out of the blue and then started throwing up. Saw blood in emesis Vision is blurry. Took 2 500mg  Tylenol and did not help. CHTN but no meds. Currently using OTC med for yeast infection

## 2019-06-10 ENCOUNTER — Telehealth: Payer: Self-pay | Admitting: Obstetrics & Gynecology

## 2019-06-10 NOTE — Telephone Encounter (Signed)
Patient feels she lost some of her mucous plug last night she wants to know if its normal and what it means at this stage in pregnancy.

## 2019-06-10 NOTE — Telephone Encounter (Signed)
Responded to via my chart.

## 2019-06-16 ENCOUNTER — Encounter: Payer: Medicaid Other | Admitting: Obstetrics & Gynecology

## 2019-06-22 ENCOUNTER — Ambulatory Visit (INDEPENDENT_AMBULATORY_CARE_PROVIDER_SITE_OTHER): Payer: Medicaid Other | Admitting: Obstetrics & Gynecology

## 2019-06-22 ENCOUNTER — Encounter: Payer: Self-pay | Admitting: Obstetrics & Gynecology

## 2019-06-22 VITALS — BP 151/97 | HR 109 | Wt 332.0 lb

## 2019-06-22 DIAGNOSIS — Z1389 Encounter for screening for other disorder: Secondary | ICD-10-CM

## 2019-06-22 DIAGNOSIS — O0993 Supervision of high risk pregnancy, unspecified, third trimester: Secondary | ICD-10-CM

## 2019-06-22 DIAGNOSIS — Z331 Pregnant state, incidental: Secondary | ICD-10-CM

## 2019-06-22 DIAGNOSIS — O10913 Unspecified pre-existing hypertension complicating pregnancy, third trimester: Secondary | ICD-10-CM

## 2019-06-22 DIAGNOSIS — O10919 Unspecified pre-existing hypertension complicating pregnancy, unspecified trimester: Secondary | ICD-10-CM

## 2019-06-22 DIAGNOSIS — Z3A32 32 weeks gestation of pregnancy: Secondary | ICD-10-CM

## 2019-06-22 DIAGNOSIS — O0990 Supervision of high risk pregnancy, unspecified, unspecified trimester: Secondary | ICD-10-CM

## 2019-06-22 LAB — POCT URINALYSIS DIPSTICK OB
Blood, UA: NEGATIVE
Glucose, UA: NEGATIVE
Ketones, UA: NEGATIVE
Leukocytes, UA: NEGATIVE
Nitrite, UA: NEGATIVE
POC,PROTEIN,UA: NEGATIVE

## 2019-06-22 MED ORDER — LABETALOL HCL 200 MG PO TABS: 200.0000 mg | ORAL_TABLET | Freq: Two times a day (BID) | ORAL | 3 refills | Status: DC

## 2019-06-22 MED ORDER — ACYCLOVIR 400 MG PO TABS: 400.0000 mg | ORAL_TABLET | Freq: Three times a day (TID) | ORAL | 2 refills | Status: DC

## 2019-06-22 NOTE — Progress Notes (Signed)
HIGH-RISK PREGNANCY VISIT Patient name: Sara Carpenter MRN [id number]  Date of birth: 02/14/1990 Chief Complaint:   Routine Prenatal Visit  History of Present Illness:   Sara Carpenter is a 29 y.o. G81P1021 female at [redacted]w[redacted]d with an Estimated Date of Delivery: 08/14/19 being seen today for ongoing management of a high-risk pregnancy complicated by [facility] started on labetalol 200 BID today.  Today she reports no complaints.  Depression screen [facility] 2/9 05/04/2019  Decreased Interest 0  Down, Depressed, Hopeless 0  PHQ - 2 Score 0  Altered sleeping 0  Tired, decreased energy 0  Change in appetite 0  Feeling bad or failure about yourself  0  Trouble concentrating 0  Moving slowly or fidgety/restless 0  Suicidal thoughts 0  PHQ-9 Score 0  Difficult doing work/chores Not difficult at all    Contractions: Not present. Vag. Bleeding: None.  Movement: Present. denies leaking of fluid.  Review of Systems:   Pertinent items are noted in HPI Denies abnormal vaginal discharge w/ itching/odor/irritation, headaches, visual changes, shortness of breath, chest pain, abdominal pain, severe nausea/vomiting, or problems with urination or bowel movements unless otherwise stated above. Pertinent History Reviewed:  Reviewed past medical,surgical, social, obstetrical and family history.  Reviewed problem list, medications and allergies. Physical Assessment:   Vitals:   06/22/19 1006  BP: (!) 151/97  Pulse: (!) 109  Weight: (!) 332 lb (150.6 kg)  Body mass index is 52 kg/m.           Physical Examination:   General appearance: alert, well appearing, and in no distress  Mental status: alert, oriented to person, place, and time  Skin: warm & dry   Extremities: Edema: None    Cardiovascular: normal heart rate noted  Respiratory: normal respiratory effort, no distress  Abdomen: gravid, soft, non-tender  Pelvic: Cervical exam deferred         Fetal Status:     Movement: Present    Fetal  Surveillance Testing today: FHR 134   Chaperone: n/a    Results for orders placed or performed in visit on 06/22/19 (from the past 24 hour(s))  POC Urinalysis Dipstick OB   Collection Time: 06/22/19 10:04 AM  Result Value Ref Range   Color, UA     Clarity, UA     Glucose, UA Negative Negative   Bilirubin, UA     Ketones, UA n    Spec Grav, UA     Blood, UA n    pH, UA     POC,PROTEIN,UA Negative Negative, Trace, Small (1+), Moderate (2+), Large (3+), 4+   Urobilinogen, UA     Nitrite, UA n    Leukocytes, UA Negative Negative   Appearance     Odor      Assessment & Plan:  1) High-risk pregnancy G4P1021 at [redacted]w[redacted]d with an Estimated Date of Delivery: 08/14/19   2) CHTN, stable, begun on labetalol 200 mg BID today  3) Hx of HSV, , 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 labetalol (NORMODYNE) 200 MG tablet    Sig: Take 1 tablet (200 mg total) by mouth 2 (two) times daily.    Dispense:  60 tablet    Refill:  3  . acyclovir (ZOVIRAX) 400 MG tablet    Sig: Take 1 tablet (400 mg total) by mouth 3 (three) times daily.    Dispense:  90 tablet    Refill:  2    Labs/procedures today:  Treatment Plan:  Begin twice weekly surveillance NST alternating with sonogram  Reviewed: Preterm labor symptoms and general obstetric precautions including but not limited to vaginal bleeding, contractions, leaking of fluid and fetal movement were reviewed in detail with the patient.  All questions were answered. Has home bp cuff. Rx faxed to . Check bp weekly, let us know if >140/90.   Follow-up: Return in about 3 days (around 06/25/2019) for NST, Nurse only.  Orders Placed This Encounter  Procedures  . POC Urinalysis Dipstick OB   Florian Buff  06/22/2019 10:29 AM

## 2019-06-23 ENCOUNTER — Encounter (HOSPITAL_COMMUNITY): Payer: Self-pay | Admitting: Family Medicine

## 2019-06-23 ENCOUNTER — Inpatient Hospital Stay (HOSPITAL_COMMUNITY)
Admission: AD | Admit: 2019-06-23 | Discharge: 2019-06-23 | Disposition: A | Discharge status: Home / Self Care | Payer: Medicaid Other | Attending: Family Medicine | Admitting: Family Medicine

## 2019-06-23 ENCOUNTER — Other Ambulatory Visit: Payer: Self-pay

## 2019-06-23 DIAGNOSIS — E669 Obesity, unspecified: Secondary | ICD-10-CM | POA: Insufficient documentation

## 2019-06-23 DIAGNOSIS — Z3689 Encounter for other specified antenatal screening: Secondary | ICD-10-CM | POA: Diagnosis not present

## 2019-06-23 DIAGNOSIS — Z87891 Personal history of nicotine dependence: Secondary | ICD-10-CM | POA: Diagnosis not present

## 2019-06-23 DIAGNOSIS — O99213 Obesity complicating pregnancy, third trimester: Secondary | ICD-10-CM | POA: Insufficient documentation

## 2019-06-23 DIAGNOSIS — O26893 Other specified pregnancy related conditions, third trimester: Secondary | ICD-10-CM | POA: Diagnosis not present

## 2019-06-23 DIAGNOSIS — O368130 Decreased fetal movements, third trimester, not applicable or unspecified: Secondary | ICD-10-CM

## 2019-06-23 DIAGNOSIS — Z7982 Long term (current) use of aspirin: Secondary | ICD-10-CM | POA: Diagnosis not present

## 2019-06-23 DIAGNOSIS — M549 Dorsalgia, unspecified: Secondary | ICD-10-CM | POA: Insufficient documentation

## 2019-06-23 DIAGNOSIS — Z3A32 32 weeks gestation of pregnancy: Secondary | ICD-10-CM | POA: Insufficient documentation

## 2019-06-23 DIAGNOSIS — Z79899 Other long term (current) drug therapy: Secondary | ICD-10-CM | POA: Insufficient documentation

## 2019-06-23 NOTE — MAU Provider Note (Signed)
History     CSN: [id number]  Arrival date and time: 06/23/19 1703   First Provider Initiated Contact with Patient 06/23/19 1824      Chief Complaint  Patient presents with  . Decreased Fetal Movement   [name] is a 29 y.o. year old G58P1021 female at [redacted]w[redacted]d weeks gestation who presents to MAU reporting she has been laying down all day and has not had much fetal movement.  She reports the last time she felt the baby move was around 4 PM.  She does state that since being in maternity assessment unit that she has felt some movement from the baby just not that much.  She states she can hear the movement more on the monitor.  She denies vaginal bleeding or loss of fluid. She also reports some intermittent back pain when she moves and she has not taken anything for the pain. She receives [facility] with [facility]. Her next appt is Thursday 06/25/2019 for NST.   OB History    Gravida  4   Para  1   Term  1   Preterm      AB  2   Living  1     SAB  2   TAB      Ectopic      Multiple      Live Births  1           Past Medical History:  Diagnosis Date  . Anxiety   . Bipolar 1 disorder (HCC)   . Depression    is fine  . Infected tooth   . Infection    UTI  . Obesity   . PCOS (polycystic ovarian syndrome)    "When I was young". No medical intervention.  . Vaginal Pap smear, abnormal    did not f/u    Past Surgical History:  Procedure Laterality Date  . DILATION AND CURETTAGE OF UTERUS N/A 09/03/2012   Procedure: SUCTION DILATATION AND CURETTAGE;  Surgeon: Lazaro Arms, MD;  Location: [facility];  Service: Gynecology;  Laterality: N/A;  . Migraine headache    . TONSILLECTOMY      Family History  Problem Relation Age of Onset  . Diabetes Paternal Grandmother   . Diabetes Paternal Grandfather   . Cancer Mother        cervical  . Cancer Paternal Uncle        lung  . Cancer Maternal Grandmother        lung  . Cancer Maternal Grandfather        throat  .  Healthy Father   . Kidney disease Sister     Social History   Tobacco Use  . Smoking status: Former Smoker    Packs/day: 0.00    Years: 2.00    Pack years: 0.00    Types: Cigarettes    Quit date: 08/25/2012    Years since quitting: 6.8  . Smokeless tobacco: Never Used  Substance Use Topics  . Alcohol use: Not Currently    Comment: occas  . Drug use: Yes    Types: Marijuana    Comment: Everyday    Allergies:  Allergies  Allergen Reactions  . Carbamazepine Nausea And Vomiting    Medications Prior to Admission  Medication Sig Dispense Refill Last Dose  . acetaminophen (TYLENOL) 500 MG tablet Take 1,000 mg by mouth every 6 (six) hours as needed.   06/23/2019 at Unknown time  . aspirin EC 81 MG tablet Take  162 mg by mouth daily.   06/22/2019 at Unknown time  . Prenatal Vit-DSS-Fe Cbn-FA (PRENATAL AD PO) Take by mouth.   06/23/2019 at Unknown time  . acyclovir (ZOVIRAX) 400 MG tablet Take 1 tablet (400 mg total) by mouth 3 (three) times daily. 90 tablet 2   . Blood Pressure Monitor MISC For regular home bp monitoring during pregnancy 1 each 0   . labetalol (NORMODYNE) 200 MG tablet Take 1 tablet (200 mg total) by mouth 2 (two) times daily. 60 tablet 3   . pantoprazole (PROTONIX) 20 MG tablet Take 1 tablet (20 mg total) by mouth daily. (Patient not taking: Reported on 06/22/2019) 30 tablet 0   . sucralfate (CARAFATE) 1 GM/10ML suspension Take 10 mLs (1 g total) by mouth 3 (three) times daily as needed (acid reflux). (Patient not taking: Reported on 06/22/2019) 60 mL 0     Review of Systems  Constitutional: Negative.   HENT: Negative.   Eyes: Negative.   Respiratory: Negative.   Cardiovascular: Negative.   Gastrointestinal: Negative.   Endocrine: Negative.   Genitourinary: Negative.   Musculoskeletal: Positive for back pain (intermittent with movement).  Skin: Negative.   Allergic/Immunologic: Negative.   Neurological: Negative.   Hematological: Negative.   Psychiatric/Behavioral:  Negative.    Physical Exam   Blood pressure 128/77, pulse (!) 111, temperature 98.9 F (37.2 C), temperature source Oral, resp. rate 20, weight (!) 151.3 kg, last menstrual period 10/21/2018, SpO2 97 %, unknown if currently breastfeeding.  Physical Exam  Nursing note and vitals reviewed. Constitutional: She is oriented to person, place, and time. She appears well-developed and well-nourished.  HENT:  Head: Normocephalic and atraumatic.  Eyes: Pupils are equal, round, and reactive to light.  Cardiovascular: Normal rate.  Respiratory: Effort normal.  GI: Soft.  Musculoskeletal:        General: Normal range of motion.     Cervical back: Normal range of motion.  Neurological: She is alert and oriented to person, place, and time.  Skin: Skin is warm and dry.  Psychiatric: She has a normal mood and affect. Her behavior is normal. Judgment and thought content normal.    REACTIVE NST - FHR: 150 bpm / moderate variability / accels present / decels absent / TOCO:Occ UI noted  MAU Course  Procedures  MDM CEFM  Assessment and Plan  NST (non-stress test) reactive - Plan: Discharge patient - Reassurance given that NST was reactive - Explained that baby is moving more than she is perceiving - Information provided on [facility] - Keep scheduled appt with [facility] on Thursday 06/25/2019 - Patient verbalized an understanding of the plan of care and agrees.    Laury Deep, MSN, CNM 06/23/2019, 6:24 PM

## 2019-06-23 NOTE — MAU Note (Signed)
Pt states that she was laying down all day today and that her baby has not been as active as he normally is.   Pt last felt the baby move about 1.5 hours ago.   Denies vaginal bleeding or LOF.   Reports constant heart burn, pt reports that she was given a prescription yesterday, but it was not ready for pick up when she went to get it so she still needs to pick it up.   Pt reports intermittent back pain with movement.

## 2019-06-23 NOTE — Discharge Instructions (Signed)
Your non-stress test was reactive today. It showed that your baby is moving around and doing well. Please go home and have dinner and stay well-hydrated.

## 2019-06-25 ENCOUNTER — Ambulatory Visit (INDEPENDENT_AMBULATORY_CARE_PROVIDER_SITE_OTHER): Payer: Medicaid Other | Admitting: *Deleted

## 2019-06-25 VITALS — BP 105/71 | HR 105

## 2019-06-25 DIAGNOSIS — O0990 Supervision of high risk pregnancy, unspecified, unspecified trimester: Secondary | ICD-10-CM

## 2019-06-25 DIAGNOSIS — Z1389 Encounter for screening for other disorder: Secondary | ICD-10-CM

## 2019-06-25 DIAGNOSIS — Z3A32 32 weeks gestation of pregnancy: Secondary | ICD-10-CM

## 2019-06-25 DIAGNOSIS — Z331 Pregnant state, incidental: Secondary | ICD-10-CM

## 2019-06-25 LAB — POCT URINALYSIS DIPSTICK OB
Blood, UA: NEGATIVE
Glucose, UA: NEGATIVE
Ketones, UA: NEGATIVE
Leukocytes, UA: NEGATIVE
Nitrite, UA: NEGATIVE
POC,PROTEIN,UA: NEGATIVE

## 2019-06-25 NOTE — Progress Notes (Signed)
° °  NURSE VISIT- NST  SUBJECTIVE:  Sara Carpenter is a 29 y.o. [phone] female at [redacted]w[redacted]d, here for a NST for pregnancy complicated by [facility].  She reports active fetal movement, contractions: none, vaginal bleeding: none, membranes: intact.   OBJECTIVE:  LMP 10/21/2018   Appears well, no apparent distress  Results for orders placed or performed in visit on 06/25/19 (from the past 24 hour(s))  POC Urinalysis Dipstick OB   Collection Time: 06/25/19 10:42 AM  Result Value Ref Range   Color, UA     Clarity, UA     Glucose, UA Negative Negative   Bilirubin, UA     Ketones, UA neg    Spec Grav, UA     Blood, UA neg    pH, UA     POC,PROTEIN,UA Negative Negative, Trace, Small (1+), Moderate (2+), Large (3+), 4+   Urobilinogen, UA     Nitrite, UA neg    Leukocytes, UA Negative Negative   Appearance     Odor      NST: FHR baseline 152 bpm, Variability: moderate, Accelerations:present, Decelerations:  Absent= Cat 1/Reactive Toco: none   ASSESSMENT: [id number] at [redacted]w[redacted]d with CHTN NST reactive  PLAN: EFM strip reviewed by Dr. Despina Hidden   Recommendations: keep next appointment as scheduled   Pt brought home bp cuff to compare with ours pt cuff reading was 105/71 ours was 105/61. Advised patient that her cuff was close in range with ours.   Annamarie Dawley  06/25/2019 11:54 AM

## 2019-06-29 ENCOUNTER — Ambulatory Visit (INDEPENDENT_AMBULATORY_CARE_PROVIDER_SITE_OTHER): Payer: Medicaid Other | Admitting: Obstetrics and Gynecology

## 2019-06-29 VITALS — BP 135/75 | HR 114 | Wt 333.4 lb

## 2019-06-29 DIAGNOSIS — O99323 Drug use complicating pregnancy, third trimester: Secondary | ICD-10-CM

## 2019-06-29 DIAGNOSIS — O0993 Supervision of high risk pregnancy, unspecified, third trimester: Secondary | ICD-10-CM

## 2019-06-29 DIAGNOSIS — L723 Sebaceous cyst: Secondary | ICD-10-CM

## 2019-06-29 DIAGNOSIS — O1093 Unspecified pre-existing hypertension complicating the puerperium: Secondary | ICD-10-CM

## 2019-06-29 DIAGNOSIS — Z3A33 33 weeks gestation of pregnancy: Secondary | ICD-10-CM | POA: Diagnosis not present

## 2019-06-29 DIAGNOSIS — F1290 Cannabis use, unspecified, uncomplicated: Secondary | ICD-10-CM

## 2019-06-29 DIAGNOSIS — O99820 Streptococcus B carrier state complicating pregnancy: Secondary | ICD-10-CM

## 2019-06-29 DIAGNOSIS — Z331 Pregnant state, incidental: Secondary | ICD-10-CM

## 2019-06-29 DIAGNOSIS — Z1389 Encounter for screening for other disorder: Secondary | ICD-10-CM

## 2019-06-29 LAB — POCT URINALYSIS DIPSTICK OB
Blood, UA: NEGATIVE
Glucose, UA: NEGATIVE
Ketones, UA: NEGATIVE
Nitrite, UA: NEGATIVE
POC,PROTEIN,UA: NEGATIVE

## 2019-06-29 NOTE — Progress Notes (Signed)
Patient ID: Sara Carpenter, female   DOB: 12/10/1990, 29 y.o.   MRN: [id number]    [facility] PREGNANCY VISIT Patient name: Sara Carpenter MRN [id number]  Date of birth: 1990/06/28 Chief Complaint:   Routine Prenatal Visit (NST)  History of Present Illness:   Sara Carpenter is a 29 y.o. [phone] female at [redacted]w[redacted]d with an Estimated Date of Delivery: 08/14/19 being seen today for ongoing management of a high-risk pregnancy complicated by [facility] currently on labetalol 200 mg BID Today she reports a rash on her underarms. She reports that she does not have much of an appetite at this time.  Contractions: Not present. Vag. Bleeding: None.  Movement: Present. denies leaking of fluid.  Review of Systems:   Pertinent items are noted in HPI Denies abnormal vaginal discharge w/ itching/odor/irritation, headaches, visual changes, shortness of breath, chest pain, abdominal pain, severe nausea/vomiting, or problems with urination or bowel movements unless otherwise stated above. Pertinent History Reviewed:  Reviewed past medical,surgical, social, obstetrical and family history.  Reviewed problem list, medications and allergies. Physical Assessment:   Vitals:   06/29/19 1043  BP: 135/75  Pulse: (!) 114  Weight: (!) 333 lb 6.4 oz (151.2 kg)  Body mass index is 52.22 kg/m.           Physical Examination:   General appearance: alert, well appearing, and in no distress and oriented to person, place, and time markedly obese.  Mental status: alert, oriented to person, place, and time, normal mood, behavior, speech, dress, motor activity, and thought processes, affect appropriate to mood  Skin: warm & dry   Extremities: Edema: None    Cardiovascular: normal heart rate noted  Respiratory: normal respiratory effort, no distress  Abdomen: gravid, soft, non-tender  Pelvic: Cervical exam deferred         Fetal Status: Fetal Heart Rate (bpm): 130 NST with accels to 155   Movement: Present    Fetal  Surveillance Testing today: NST  Results for orders placed or performed in visit on 06/29/19 (from the past 24 hour(s))  POC Urinalysis Dipstick OB   Collection Time: 06/29/19 10:46 AM  Result Value Ref Range   Color, UA     Clarity, UA     Glucose, UA Negative Negative   Bilirubin, UA     Ketones, UA neg    Spec Grav, UA     Blood, UA neg    pH, UA     POC,PROTEIN,UA Negative Negative, Trace, Small (1+), Moderate (2+), Large (3+), 4+   Urobilinogen, UA     Nitrite, UA neg    Leukocytes, UA Small (1+) (A) Negative   Appearance     Odor      Assessment & Plan:  1) High-risk pregnancy [id number] at [redacted]w[redacted]d with an Estimated Date of Delivery: 08/14/19   2) CHTN, stable with labetalol 200 mg BID  3) Hx of HSV, will begin taking Acyclovir at 34 weeks  4) Marijuana use during pregnancy, uses it less than before, but has not completely stopped  5) + GBS  Meds: No orders of the defined types were placed in this encounter.  Labs/procedures today: none  Treatment Plan:  Twice weekly surveillance, alternating BPP with NST  Reviewed: Preterm labor symptoms and general obstetric precautions including but not limited to vaginal bleeding, contractions, leaking of fluid and fetal movement were reviewed in detail with the patient.  All questions were answered. Has home bp cuff. Check bp weekly, let us know if >140/90.  Follow-up: Return in 3 days (on 07/02/2019) for NST.  By signing my name below, I, De Burrs, attest that this documentation has been prepared under the direction and in the presence of Jonnie Kind, MD. Electronically Signed: De Burrs, Medical Scribe. 06/29/19. 11:29 AM.  I personally performed the services described in this documentation, which was SCRIBED in my presence. The recorded information has been reviewed and considered accurate. It has been edited as necessary during review. Jonnie Kind, MD

## 2019-07-02 ENCOUNTER — Ambulatory Visit (INDEPENDENT_AMBULATORY_CARE_PROVIDER_SITE_OTHER): Payer: Medicaid Other | Admitting: Obstetrics and Gynecology

## 2019-07-02 VITALS — BP 127/84 | HR 115 | Wt 334.2 lb

## 2019-07-02 DIAGNOSIS — Z23 Encounter for immunization: Secondary | ICD-10-CM | POA: Diagnosis not present

## 2019-07-02 DIAGNOSIS — Z3A33 33 weeks gestation of pregnancy: Secondary | ICD-10-CM

## 2019-07-02 DIAGNOSIS — Z1389 Encounter for screening for other disorder: Secondary | ICD-10-CM

## 2019-07-02 DIAGNOSIS — O0993 Supervision of high risk pregnancy, unspecified, third trimester: Secondary | ICD-10-CM

## 2019-07-02 DIAGNOSIS — Z331 Pregnant state, incidental: Secondary | ICD-10-CM

## 2019-07-02 DIAGNOSIS — O0990 Supervision of high risk pregnancy, unspecified, unspecified trimester: Secondary | ICD-10-CM

## 2019-07-02 LAB — POCT URINALYSIS DIPSTICK OB
Blood, UA: NEGATIVE
Glucose, UA: NEGATIVE
Ketones, UA: NEGATIVE
Leukocytes, UA: NEGATIVE
Nitrite, UA: NEGATIVE
POC,PROTEIN,UA: NEGATIVE

## 2019-07-02 MED ORDER — KETOCONAZOLE 2 % EX CREA
1.0000 | TOPICAL_CREAM | Freq: Every day | CUTANEOUS | 1 refills | Status: DC
Start: 2019-07-02 — End: 2019-07-30

## 2019-07-02 NOTE — Progress Notes (Signed)
Ketoconazole applied to hyperkeratotic erythematous skin in both axillae.

## 2019-07-06 ENCOUNTER — Encounter: Payer: Self-pay | Admitting: Women's Health

## 2019-07-06 ENCOUNTER — Ambulatory Visit (INDEPENDENT_AMBULATORY_CARE_PROVIDER_SITE_OTHER): Payer: Medicaid Other | Admitting: Women's Health

## 2019-07-06 VITALS — BP 134/86 | HR 106 | Wt 334.0 lb

## 2019-07-06 DIAGNOSIS — R8271 Bacteriuria: Secondary | ICD-10-CM

## 2019-07-06 DIAGNOSIS — O10913 Unspecified pre-existing hypertension complicating pregnancy, third trimester: Secondary | ICD-10-CM | POA: Diagnosis not present

## 2019-07-06 DIAGNOSIS — R768 Other specified abnormal immunological findings in serum: Secondary | ICD-10-CM

## 2019-07-06 DIAGNOSIS — Z3A34 34 weeks gestation of pregnancy: Secondary | ICD-10-CM

## 2019-07-06 DIAGNOSIS — O10919 Unspecified pre-existing hypertension complicating pregnancy, unspecified trimester: Secondary | ICD-10-CM

## 2019-07-06 DIAGNOSIS — O0990 Supervision of high risk pregnancy, unspecified, unspecified trimester: Secondary | ICD-10-CM

## 2019-07-06 DIAGNOSIS — O0993 Supervision of high risk pregnancy, unspecified, third trimester: Secondary | ICD-10-CM | POA: Diagnosis not present

## 2019-07-06 DIAGNOSIS — Z1389 Encounter for screening for other disorder: Secondary | ICD-10-CM

## 2019-07-06 DIAGNOSIS — Z331 Pregnant state, incidental: Secondary | ICD-10-CM

## 2019-07-06 LAB — POCT URINALYSIS DIPSTICK OB
Blood, UA: NEGATIVE
Glucose, UA: NEGATIVE
Ketones, UA: NEGATIVE
Leukocytes, UA: NEGATIVE
Nitrite, UA: NEGATIVE
POC,PROTEIN,UA: NEGATIVE

## 2019-07-06 NOTE — Progress Notes (Signed)
HIGH-RISK PREGNANCY VISIT Patient name: Sara Carpenter MRN [id number]  Date of birth: 1990/03/12 Chief Complaint:   Routine Prenatal Visit (NST)  History of Present Illness:   MYRELLA FAHS is a 29 y.o. [phone] female at [redacted]w[redacted]d with an Estimated Date of Delivery: 08/14/19 being seen today for ongoing management of a high-risk pregnancy complicated by chronic hypertension currently on Labetalol 200mg  BID.  Today she reports mucousy d/c, no itching/odor/irritation.  Depression screen [facility] 2/9 05/04/2019  Decreased Interest 0  Down, Depressed, Hopeless 0  PHQ - 2 Score 0  Altered sleeping 0  Tired, decreased energy 0  Change in appetite 0  Feeling bad or failure about yourself  0  Trouble concentrating 0  Moving slowly or fidgety/restless 0  Suicidal thoughts 0  PHQ-9 Score 0  Difficult doing work/chores Not difficult at all    Contractions: Not present. Vag. Bleeding: None.  Movement: Present. denies leaking of fluid.  Review of Systems:   Pertinent items are noted in HPI Denies abnormal vaginal discharge w/ itching/odor/irritation, headaches, visual changes, shortness of breath, chest pain, abdominal pain, severe nausea/vomiting, or problems with urination or bowel movements unless otherwise stated above. Pertinent History Reviewed:  Reviewed past medical,surgical, social, obstetrical and family history.  Reviewed problem list, medications and allergies. Physical Assessment:   Vitals:   07/06/19 1100  BP: 134/86  Pulse: (!) 106  Weight: (!) 334 lb (151.5 kg)  Body mass index is 52.31 kg/m.           Physical Examination:   General appearance: alert, well appearing, and in no distress  Mental status: alert, oriented to person, place, and time  Skin: warm & dry   Extremities: Edema: None    Cardiovascular: normal heart rate noted  Respiratory: normal respiratory effort, no distress  Abdomen: gravid, soft, non-tender  Pelvic: Cervical exam deferred         Fetal  Status:     Movement: Present    Fetal Surveillance Testing today: NST: FHR baseline 135 bpm, Variability: moderate, Accelerations:present, Decelerations:  Absent= Cat 1/Reactive Toco: none    Chaperone: n/a    Results for orders placed or performed in visit on 07/06/19 (from the past 24 hour(s))  POC Urinalysis Dipstick OB   Collection Time: 07/06/19 10:59 AM  Result Value Ref Range   Color, UA     Clarity, UA     Glucose, UA Negative Negative   Bilirubin, UA     Ketones, UA n    Spec Grav, UA     Blood, UA n    pH, UA     POC,PROTEIN,UA Negative Negative, Trace, Small (1+), Moderate (2+), Large (3+), 4+   Urobilinogen, UA     Nitrite, UA n    Leukocytes, UA Negative Negative   Appearance     Odor      Assessment & Plan:  1) High-risk pregnancy G4P1021 at [redacted]w[redacted]d with an Estimated Date of Delivery: 08/14/19   2) CHTN, stable on Labetalol 200mg  BID, ASA 162mg   Meds: No orders of the defined types were placed in this encounter.  Labs/procedures today: nst  Treatment Plan:  2x/wk testing, bpp/dopp alt w/ nst (no u/s available here until she is scheduled next week, note sent to [facility] to check w/MFM), EFW q4wks, IOL @ 39wks  Reviewed: Preterm labor symptoms and general obstetric precautions including but not limited to vaginal bleeding, contractions, leaking of fluid and fetal movement were reviewed in detail with the patient.  All questions were answered. Has home bp cuff. Check bp weekly, let us know if >140/90.   Follow-up: Return for As scheduled Thurs NST w/ nurse (no u/s available).  Orders Placed This Encounter  Procedures  . US OB Follow Up  . US FETAL BPP WO NON STRESS  . [place] UA Cord Doppler  . POC Urinalysis Dipstick OB   Roma Schanz CNM, [facility] 07/06/2019 11:33 AM

## 2019-07-06 NOTE — Patient Instructions (Addendum)
Lalla Brothers Agrawal, I greatly value your feedback.  If you receive a survey following your visit with [place] today, we appreciate you taking the time to fill it out.  Thanks, Joellyn Haff, CNM, WHNP-BC  [facility] ([address] [place], [place] [address]) Entrance C, located off of [address] [employer] valet parking   Go to [email or web address] to register for FREE online childbirth classes    Call the office [phone]) or go to [facility] if:  You begin to have strong, frequent contractions  Your water breaks.  Sometimes it is a big gush of fluid, sometimes it is just a trickle that keeps getting your panties wet or running down your legs  You have vaginal bleeding.  It is normal to have a small amount of spotting if your cervix was checked.   You don't feel your baby moving like normal.  If you don't, get you something to eat and drink and lay down and focus on feeling your baby move.  You should feel at least 10 movements in 2 hours.  If you don't, you should call the office or go to [facility].   Call the office [phone]) or go to [facility] hospital for these signs of pre-eclampsia:  Severe headache that does not go away with Tylenol  Visual changes- seeing spots, double, blurred vision  Pain under your right breast or upper abdomen that does not go away with Tums or heartburn medicine  Nausea and/or vomiting  Severe swelling in your hands, feet, and face    Home Blood Pressure Monitoring for Patients   Your provider has recommended that you check your blood pressure (BP) at least once a week at home. If you do not have a blood pressure cuff at home, one will be provided for you. Contact your provider if you have not received your monitor within 1 week. If top >=160 or bottom >=110, check again in , if still this high call us or go straight to [facility] hospital.   Helpful Tips for Accurate Home Blood Pressure Checks   Don't smoke,  exercise, or drink caffeine 30 minutes before checking your BP  Use the restroom before checking your BP (a full bladder can raise your pressure)  Relax in a comfortable upright chair  Feet on the ground  Left arm resting comfortably on a flat surface at the level of your heart  Legs uncrossed  Back supported  Sit quietly and don't talk  Place the cuff on your bare arm  Adjust snuggly, so that only two fingertips can fit between your skin and the top of the cuff  Check 2 readings separated by at least one minute  Keep a log of your BP readings  For a visual, please reference this diagram: http://ccnc.care/bpdiagram  Provider Name: [facility]     Phone: [phone]  Zone 1: ALL CLEAR  Continue to monitor your symptoms:   BP reading is less than 140 (top number) or less than 90 (bottom number)   No right upper stomach pain  No headaches or seeing spots  No feeling nauseated or throwing up  No swelling in face and hands  Zone 2: CAUTION Call your doctor's office for any of the following:   BP reading is greater than 140 (top number) or greater than 90 (bottom number)   Stomach pain under your ribs in the middle or right side  Headaches or seeing spots  Feeling nauseated or  throwing up  Swelling in face and hands  Zone 3: EMERGENCY  Seek immediate medical care if you have any of the following:   BP reading is greater than160 (top number) or greater than 110 (bottom number)  Severe headaches not improving with Tylenol  Serious difficulty catching your breath  Any worsening symptoms from Zone 2  Preterm Labor and Birth Information  The normal length of a pregnancy is 39-41 weeks. Preterm labor is when labor starts before 37 completed weeks of pregnancy. What are the risk factors for preterm labor? Preterm labor is more likely to occur in women who:  Have certain infections during pregnancy such as a bladder infection, sexually transmitted  infection, or infection inside the uterus (chorioamnionitis).  Have a shorter-than-normal cervix.  Have gone into preterm labor before.  Have had surgery on their cervix.  Are younger than age 17 or older than age 43.  Are African American.  Are pregnant with twins or multiple babies (multiple gestation).  Take street drugs or smoke while pregnant.  Do not gain enough weight while pregnant.  Became pregnant shortly after having been pregnant. What are the symptoms of preterm labor? Symptoms of preterm labor include:  Cramps similar to those that can happen during a menstrual period. The cramps may happen with diarrhea.  Pain in the abdomen or lower back.  Regular uterine contractions that may feel like tightening of the abdomen.  A feeling of increased pressure in the pelvis.  Increased watery or bloody mucus discharge from the vagina.  Water breaking (ruptured amniotic sac). Why is it important to recognize signs of preterm labor? It is important to recognize signs of preterm labor because babies who are born prematurely may not be fully developed. This can put them at an increased risk for:  Long-term (chronic) heart and lung problems.  Difficulty immediately after birth with regulating body systems, including blood sugar, body temperature, heart rate, and breathing rate.  Bleeding in the brain.  Cerebral palsy.  Learning difficulties.  Death. These risks are highest for babies who are born before 34 weeks of pregnancy. How is preterm labor treated? Treatment depends on the length of your pregnancy, your condition, and the health of your baby. It may involve: 1. Having a stitch (suture) placed in your cervix to prevent your cervix from opening too early (cerclage). 2. Taking or being given medicines, such as: ? Hormone medicines. These may be given early in pregnancy to help support the pregnancy. ? Medicine to stop contractions. ? Medicines to help mature the  babys lungs. These may be prescribed if the risk of delivery is high. ? Medicines to prevent your baby from developing cerebral palsy. If the labor happens before 34 weeks of pregnancy, you may need to stay in the hospital. What should I do if I think I am in preterm labor? If you think that you are going into preterm labor, call your health care provider right away. How can I prevent preterm labor in future pregnancies? To increase your chance of having a full-term pregnancy:  Do not use any tobacco products, such as cigarettes, chewing tobacco, and e-cigarettes. If you need help quitting, ask your health care provider.  Do not use street drugs or medicines that have not been prescribed to you during your pregnancy.  Talk with your health care provider before taking any herbal supplements, even if you have been taking them regularly.  Make sure you gain a healthy amount of weight during your pregnancy.  Watch for infection. If you think that you might have an infection, get it checked right away.  Make sure to tell your health care provider if you have gone into preterm labor before. This information is not intended to replace advice given to you by your health care provider. Make sure you discuss any questions you have with your health care provider. Document Revised: 04/25/2018 Document Reviewed: 05/25/2015 Elsevier Patient Education  2020 Elsevier Inc.  AREA PEDIATRIC/FAMILY PRACTICE PHYSICIANS  [facility] [address] [place], [place] [address] Phone - [phone]   Fax - [phone]  [facility] [address] [place], [place]  [address] Phone - [phone]   Fax - [phone]  [facility] [address] [place], [place]  [address] Phone - [phone]   Fax - [phone]  [facility] [address] [place], [place]  [address] Phone - [phone]   Fax - [phone]  [facility] [address] [place], [place]  [address] Phone - [phone]   Fax - [phone]  [facility] [address] [place], [place]  [address] Phone - [phone]   Fax - [phone]  [facility]  [address] [place], [place]  [address] Phone - [phone]   Fax - [phone]  [facility] AT [facility] [address] [place], [address] [place], [place]  [address] Phone - [phone]   Fax - [phone]  [facility] AT [facility] [address] [place], [place]  [address] Phone - [phone]   Fax - [phone] [facility] AT [place] [address] [place], [place]  [address] Phone - [phone]   Fax - [phone]  [facility] [address] [place], [place]  [address] Phone - [phone]   Fax - [phone]  [facility] [address] [place], [place]  [address] Phone - [phone]   Fax - [phone]  [facility] [place] [address] [place], [place]  [address] Phone - [phone]   Fax - [phone]  [facility] [address] [place], [place]  [address] Phone - [phone]  [facility] [address], [place]  [address] Phone - [phone]   Fax - [phone]  [facility] [address] [place], [place]  [address] Phone - [phone]   Fax - [phone]  [facility] [address] [place], [place]  [address] Phone - [phone]   Fax - [phone]  [facility] [address] [place], [place]  [address] Phone - [phone]   Fax - [phone]   [facility] [address] [place], [place]  [address] Phone - [phone]   Fax - [phone]  [facility] [address] [place], [place]  [address] Phone -  [phone]   Fax - [phone]  DAVID RUBIN [address] [place], [place]  [address] Phone - [phone]   Fax - [phone]  [facility] [address] [place], [place]  [address] Phone - [phone]   Fax - [phone]  [place] [facility] Alita Chyle [address] [place], [place]  [address] Phone - [phone]   Fax - [phone] Gerarda Fraction [address] [place], [place]  [address] Phone - [phone]   Fax - [phone]  [place]  [place], [place]  [address] Phone - [phone]   Fax - [place] [address] [place], [place]  [address] Phone - [phone]   Fax - [phone]

## 2019-07-09 ENCOUNTER — Ambulatory Visit (INDEPENDENT_AMBULATORY_CARE_PROVIDER_SITE_OTHER): Payer: Medicaid Other | Admitting: *Deleted

## 2019-07-09 ENCOUNTER — Other Ambulatory Visit: Payer: Self-pay

## 2019-07-09 VITALS — BP 139/88 | HR 109 | Wt 334.8 lb

## 2019-07-09 DIAGNOSIS — O0990 Supervision of high risk pregnancy, unspecified, unspecified trimester: Secondary | ICD-10-CM

## 2019-07-09 DIAGNOSIS — Z331 Pregnant state, incidental: Secondary | ICD-10-CM

## 2019-07-09 DIAGNOSIS — O10919 Unspecified pre-existing hypertension complicating pregnancy, unspecified trimester: Secondary | ICD-10-CM

## 2019-07-09 DIAGNOSIS — Z1389 Encounter for screening for other disorder: Secondary | ICD-10-CM

## 2019-07-09 DIAGNOSIS — O288 Other abnormal findings on antenatal screening of mother: Secondary | ICD-10-CM

## 2019-07-09 DIAGNOSIS — O10913 Unspecified pre-existing hypertension complicating pregnancy, third trimester: Secondary | ICD-10-CM

## 2019-07-09 DIAGNOSIS — Z3689 Encounter for other specified antenatal screening: Secondary | ICD-10-CM | POA: Diagnosis not present

## 2019-07-09 DIAGNOSIS — Z3A34 34 weeks gestation of pregnancy: Secondary | ICD-10-CM

## 2019-07-09 DIAGNOSIS — O0993 Supervision of high risk pregnancy, unspecified, third trimester: Secondary | ICD-10-CM

## 2019-07-09 LAB — POCT URINALYSIS DIPSTICK OB
Blood, UA: NEGATIVE
Glucose, UA: NEGATIVE
Ketones, UA: NEGATIVE
Leukocytes, UA: NEGATIVE
Nitrite, UA: NEGATIVE

## 2019-07-09 NOTE — Progress Notes (Signed)
   NURSE VISIT- NST  SUBJECTIVE:  Sara Carpenter is a 29 y.o. [phone] female at [redacted]w[redacted]d, here for a NST for pregnancy complicated by [facility].  She reports active fetal movement, contractions: none, vaginal bleeding: none, membranes: intact.   OBJECTIVE:  BP 139/88   Pulse (!) 109   Wt (!) 334 lb 12.8 oz (151.9 kg)   LMP 10/21/2018   BMI 52.44 kg/m   Appears well, no apparent distress  Results for orders placed or performed in visit on 07/09/19 (from the past 24 hour(s))  POC Urinalysis Dipstick OB   Collection Time: 07/09/19 11:08 AM  Result Value Ref Range   Color, UA     Clarity, UA     Glucose, UA Negative Negative   Bilirubin, UA     Ketones, UA neg    Spec Grav, UA     Blood, UA neg    pH, UA     POC,PROTEIN,UA Trace Negative, Trace, Small (1+), Moderate (2+), Large (3+), 4+   Urobilinogen, UA     Nitrite, UA neg    Leukocytes, UA Negative Negative   Appearance     Odor      NST: FHR baseline 140 bpm, Variability: moderate, Accelerations:present, Decelerations:  Absent= Cat 1/Reactive Toco: none   ASSESSMENT: [id number] at [redacted]w[redacted]d with CHTN NST reactive  PLAN: EFM strip reviewed by Dr. Despina Hidden   Recommendations: keep next appointment as scheduled    [name]  07/09/2019 12:20 PM

## 2019-07-13 ENCOUNTER — Encounter: Payer: Self-pay | Admitting: Obstetrics & Gynecology

## 2019-07-13 ENCOUNTER — Ambulatory Visit (INDEPENDENT_AMBULATORY_CARE_PROVIDER_SITE_OTHER): Payer: Medicaid Other

## 2019-07-13 ENCOUNTER — Ambulatory Visit (INDEPENDENT_AMBULATORY_CARE_PROVIDER_SITE_OTHER): Payer: Medicaid Other | Admitting: Obstetrics & Gynecology

## 2019-07-13 ENCOUNTER — Other Ambulatory Visit: Payer: Self-pay

## 2019-07-13 VITALS — BP 132/88 | HR 108 | Wt 334.0 lb

## 2019-07-13 DIAGNOSIS — Z3A35 35 weeks gestation of pregnancy: Secondary | ICD-10-CM

## 2019-07-13 DIAGNOSIS — O0993 Supervision of high risk pregnancy, unspecified, third trimester: Secondary | ICD-10-CM | POA: Diagnosis not present

## 2019-07-13 DIAGNOSIS — O10913 Unspecified pre-existing hypertension complicating pregnancy, third trimester: Secondary | ICD-10-CM | POA: Diagnosis not present

## 2019-07-13 DIAGNOSIS — O10919 Unspecified pre-existing hypertension complicating pregnancy, unspecified trimester: Secondary | ICD-10-CM

## 2019-07-13 DIAGNOSIS — Z1389 Encounter for screening for other disorder: Secondary | ICD-10-CM

## 2019-07-13 DIAGNOSIS — Z331 Pregnant state, incidental: Secondary | ICD-10-CM

## 2019-07-13 LAB — POCT URINALYSIS DIPSTICK OB
Blood, UA: NEGATIVE
Glucose, UA: NEGATIVE
Ketones, UA: NEGATIVE
Leukocytes, UA: NEGATIVE
Nitrite, UA: NEGATIVE
POC,PROTEIN,UA: NEGATIVE

## 2019-07-13 NOTE — Progress Notes (Signed)
[place] 35+3 wks,cephalic,BPP 8/8,anterior placenta gr 3,fhr 164 bpm,afi 17.6 cm,RI .54,.55,.55,.46=25%,limited view

## 2019-07-13 NOTE — Progress Notes (Signed)
House PREGNANCY VISIT Patient name: Sara Carpenter MRN [id number]  Date of birth: April 14, 1990 Chief Complaint:   Routine Prenatal Visit (Ultrasound)  History of Present Illness:   Sara Carpenter is a 29 y.o. [phone] female at [redacted]w[redacted]d with an Estimated Date of Delivery: 08/14/19 being seen today for ongoing management of a high-risk pregnancy complicated by [facility] and fetal macrosomia.  Today she reports no complaints.  Depression screen [facility] 2/9 05/04/2019  Decreased Interest 0  Down, Depressed, Hopeless 0  PHQ - 2 Score 0  Altered sleeping 0  Tired, decreased energy 0  Change in appetite 0  Feeling bad or failure about yourself  0  Trouble concentrating 0  Moving slowly or fidgety/restless 0  Suicidal thoughts 0  PHQ-9 Score 0  Difficult doing work/chores Not difficult at all    Contractions: Not present. Vag. Bleeding: None.  Movement: Present. denies leaking of fluid.  Review of Systems:   Pertinent items are noted in HPI Denies abnormal vaginal discharge w/ itching/odor/irritation, headaches, visual changes, shortness of breath, chest pain, abdominal pain, severe nausea/vomiting, or problems with urination or bowel movements unless otherwise stated above. Pertinent History Reviewed:  Reviewed past medical,surgical, social, obstetrical and family history.  Reviewed problem list, medications and allergies. Physical Assessment:   Vitals:   07/13/19 1142  BP: 132/88  Pulse: (!) 108  Weight: (!) 334 lb (151.5 kg)  Body mass index is 52.31 kg/m.           Physical Examination:   General appearance: alert, well appearing, and in no distress  Mental status: alert, oriented to person, place, and time  Skin: warm & dry   Extremities: Edema: None    Cardiovascular: normal heart rate noted  Respiratory: normal respiratory effort, no distress  Abdomen: gravid, soft, non-tender  Pelvic: Cervical exam deferred         Fetal Status:     Movement: Present    Fetal  Surveillance Testing today: BPP 8/8 Dopplers 25%   Chaperone: n/a    Results for orders placed or performed in visit on 07/13/19 (from the past 24 hour(s))  POC Urinalysis Dipstick OB   Collection Time: 07/13/19 11:41 AM  Result Value Ref Range   Color, UA     Clarity, UA     Glucose, UA Negative Negative   Bilirubin, UA     Ketones, UA n    Spec Grav, UA     Blood, UA n    pH, UA     POC,PROTEIN,UA Negative Negative, Trace, Small (1+), Moderate (2+), Large (3+), 4+   Urobilinogen, UA     Nitrite, UA n    Leukocytes, UA Negative Negative   Appearance     Odor      Assessment & Plan:  1) High-risk pregnancy [id number] at [redacted]w[redacted]d with an Estimated Date of Delivery: 08/14/19   2) CHTN on labetalol 200 BID, stable, normal Dopplers w/reassuring testing  3) Suspected fetal macrosomia, stable, 99.8%tile, extrapolates to almost 5000 grams at 39 weeks, depends on GA of labor and timing of delivery on recommendation for ode of delivery  Meds: No orders of the defined types were placed in this encounter.   Labs/procedures today: BPP 8/8 and normal Dopplers  Treatment Plan:  Continue twice weekly testing and delivery at 39 weeks unless otherwise indicated  Reviewed: Preterm labor symptoms and general obstetric precautions including but not limited to vaginal bleeding, contractions, leaking of fluid and fetal movement were reviewed in detail  with the patient.  All questions were answered. Has home bp cuff. Rx faxed to . Check bp weekly, let us know if >140/90.   Follow-up: Return in about 3 days (around 07/16/2019) for NST, HROB.  Orders Placed This Encounter  Procedures  . POC Urinalysis Dipstick OB   Lazaro Arms  07/13/2019 11:57 AM

## 2019-07-16 ENCOUNTER — Ambulatory Visit (INDEPENDENT_AMBULATORY_CARE_PROVIDER_SITE_OTHER): Payer: Medicaid Other | Admitting: *Deleted

## 2019-07-16 VITALS — BP 125/74 | HR 95 | Wt 336.8 lb

## 2019-07-16 DIAGNOSIS — O10919 Unspecified pre-existing hypertension complicating pregnancy, unspecified trimester: Secondary | ICD-10-CM

## 2019-07-16 DIAGNOSIS — Z3A35 35 weeks gestation of pregnancy: Secondary | ICD-10-CM

## 2019-07-16 DIAGNOSIS — Z1389 Encounter for screening for other disorder: Secondary | ICD-10-CM | POA: Diagnosis not present

## 2019-07-16 DIAGNOSIS — O0990 Supervision of high risk pregnancy, unspecified, unspecified trimester: Secondary | ICD-10-CM

## 2019-07-16 DIAGNOSIS — O10913 Unspecified pre-existing hypertension complicating pregnancy, third trimester: Secondary | ICD-10-CM

## 2019-07-16 DIAGNOSIS — Z331 Pregnant state, incidental: Secondary | ICD-10-CM | POA: Diagnosis not present

## 2019-07-16 LAB — POCT URINALYSIS DIPSTICK OB
Blood, UA: NEGATIVE
Glucose, UA: NEGATIVE
Ketones, UA: NEGATIVE
Leukocytes, UA: NEGATIVE
Nitrite, UA: NEGATIVE

## 2019-07-16 NOTE — Progress Notes (Signed)
   NURSE VISIT- NST  SUBJECTIVE:  Sara Carpenter is a 29 y.o. [phone] female at [redacted]w[redacted]d, here for a NST for pregnancy complicated by [facility].  She reports active fetal movement, contractions: none, vaginal bleeding: none, membranes: intact.   OBJECTIVE:  BP 125/74   Pulse 95   Wt (!) 336 lb 12.8 oz (152.8 kg)   LMP 10/21/2018   BMI 52.75 kg/m   Appears well, no apparent distress  Results for orders placed or performed in visit on 07/16/19 (from the past 24 hour(s))  POC Urinalysis Dipstick OB   Collection Time: 07/16/19 10:57 AM  Result Value Ref Range   Color, UA     Clarity, UA     Glucose, UA Negative Negative   Bilirubin, UA     Ketones, UA neg    Spec Grav, UA     Blood, UA neg    pH, UA     POC,PROTEIN,UA Trace Negative, Trace, Small (1+), Moderate (2+), Large (3+), 4+   Urobilinogen, UA     Nitrite, UA neg    Leukocytes, UA Negative Negative   Appearance     Odor      NST: FHR baseline 140 bpm, Variability: moderate, Accelerations:present, Decelerations:  Absent= Cat 1/Reactive Toco: none   ASSESSMENT: [id number] at [redacted]w[redacted]d with CHTN NST reactive  PLAN: EFM strip reviewed by Cathie Beams, CNM   Recommendations: keep next appointment as scheduled    [name]  07/16/2019 1:24 PM

## 2019-07-21 ENCOUNTER — Other Ambulatory Visit: Payer: Self-pay

## 2019-07-21 ENCOUNTER — Ambulatory Visit (INDEPENDENT_AMBULATORY_CARE_PROVIDER_SITE_OTHER): Payer: Medicaid Other | Admitting: *Deleted

## 2019-07-21 VITALS — BP 123/84 | HR 108 | Wt 337.0 lb

## 2019-07-21 DIAGNOSIS — O10913 Unspecified pre-existing hypertension complicating pregnancy, third trimester: Secondary | ICD-10-CM | POA: Diagnosis not present

## 2019-07-21 DIAGNOSIS — Z1389 Encounter for screening for other disorder: Secondary | ICD-10-CM

## 2019-07-21 DIAGNOSIS — Z3A36 36 weeks gestation of pregnancy: Secondary | ICD-10-CM

## 2019-07-21 DIAGNOSIS — O10919 Unspecified pre-existing hypertension complicating pregnancy, unspecified trimester: Secondary | ICD-10-CM

## 2019-07-21 DIAGNOSIS — Z331 Pregnant state, incidental: Secondary | ICD-10-CM

## 2019-07-21 DIAGNOSIS — O0990 Supervision of high risk pregnancy, unspecified, unspecified trimester: Secondary | ICD-10-CM

## 2019-07-21 LAB — POCT URINALYSIS DIPSTICK OB
Blood, UA: NEGATIVE
Glucose, UA: NEGATIVE
Nitrite, UA: NEGATIVE

## 2019-07-21 NOTE — Progress Notes (Addendum)
   NURSE VISIT- NST  SUBJECTIVE:  Sara Carpenter is a 29 y.o. [phone] female at [redacted]w[redacted]d, here for a NST for pregnancy complicated by [facility].  She reports active fetal movement, contractions: none, vaginal bleeding: none, membranes: intact.   OBJECTIVE:  BP 123/84   Pulse (!) 108   Wt (!) 337 lb (152.9 kg)   LMP 10/21/2018   BMI 52.78 kg/m   Appears well, no apparent distress  Results for orders placed or performed in visit on 07/21/19 (from the past 24 hour(s))  POC Urinalysis Dipstick OB   Collection Time: 07/21/19 10:59 AM  Result Value Ref Range   Color, UA     Clarity, UA     Glucose, UA Negative Negative   Bilirubin, UA     Ketones, UA trace    Spec Grav, UA     Blood, UA neg    pH, UA     POC,PROTEIN,UA Trace Negative, Trace, Small (1+), Moderate (2+), Large (3+), 4+   Urobilinogen, UA     Nitrite, UA neg    Leukocytes, UA Trace (A) Negative   Appearance     Odor      NST: FHR baseline 130 bpm, Variability: moderate, Accelerations:present, Decelerations:  Absent= Cat 1/Reactive Toco: none   ASSESSMENT: [id number] at [redacted]w[redacted]d with CHTN NST reactive  PLAN: EFM strip reviewed by Dr. Despina Hidden   Recommendations: keep next appointment as scheduled    [name]  07/21/2019 12:33 PM   Attestation of Attending Supervision of Nursing Visit Encounter: Evaluation and management procedures were performed by the nursing staff under my supervision and collaboration.  I have reviewed the nurse's note and chart, and I agree with the management and plan.  Rockne Coons MD Attending Physician for the Center for [facility] 07/21/2019 4:40 PM

## 2019-07-23 ENCOUNTER — Other Ambulatory Visit: Payer: Self-pay | Admitting: Obstetrics & Gynecology

## 2019-07-23 DIAGNOSIS — O10919 Unspecified pre-existing hypertension complicating pregnancy, unspecified trimester: Secondary | ICD-10-CM

## 2019-07-24 ENCOUNTER — Ambulatory Visit (INDEPENDENT_AMBULATORY_CARE_PROVIDER_SITE_OTHER): Payer: Medicaid Other | Admitting: Obstetrics & Gynecology

## 2019-07-24 ENCOUNTER — Other Ambulatory Visit (HOSPITAL_COMMUNITY)
Admission: RE | Admit: 2019-07-24 | Discharge: 2019-07-24 | Disposition: A | Discharge status: Home / Self Care | Payer: Medicaid Other | Source: Ambulatory Visit | Attending: Obstetrics & Gynecology | Admitting: Obstetrics & Gynecology

## 2019-07-24 ENCOUNTER — Encounter: Payer: Self-pay | Admitting: Obstetrics & Gynecology

## 2019-07-24 ENCOUNTER — Ambulatory Visit (INDEPENDENT_AMBULATORY_CARE_PROVIDER_SITE_OTHER): Payer: Medicaid Other

## 2019-07-24 ENCOUNTER — Other Ambulatory Visit: Payer: Self-pay | Admitting: Obstetrics & Gynecology

## 2019-07-24 VITALS — BP 120/74 | HR 111 | Wt 338.4 lb

## 2019-07-24 DIAGNOSIS — Z1389 Encounter for screening for other disorder: Secondary | ICD-10-CM | POA: Diagnosis not present

## 2019-07-24 DIAGNOSIS — O10919 Unspecified pre-existing hypertension complicating pregnancy, unspecified trimester: Secondary | ICD-10-CM

## 2019-07-24 DIAGNOSIS — O0990 Supervision of high risk pregnancy, unspecified, unspecified trimester: Secondary | ICD-10-CM

## 2019-07-24 DIAGNOSIS — Z3A37 37 weeks gestation of pregnancy: Secondary | ICD-10-CM

## 2019-07-24 DIAGNOSIS — O10913 Unspecified pre-existing hypertension complicating pregnancy, third trimester: Secondary | ICD-10-CM

## 2019-07-24 DIAGNOSIS — Z331 Pregnant state, incidental: Secondary | ICD-10-CM

## 2019-07-24 DIAGNOSIS — O0993 Supervision of high risk pregnancy, unspecified, third trimester: Secondary | ICD-10-CM

## 2019-07-24 LAB — POCT URINALYSIS DIPSTICK OB
Blood, UA: NEGATIVE
Glucose, UA: NEGATIVE
Leukocytes, UA: NEGATIVE
Nitrite, UA: NEGATIVE
POC,PROTEIN,UA: NEGATIVE

## 2019-07-24 NOTE — Progress Notes (Signed)
[place] 37 wks,cephalic,BPP 8/8,FHR 140 BPM,anterior placenta gr 3,AFI 17.8 cm,RI .68,.53,.51=49%

## 2019-07-24 NOTE — Progress Notes (Signed)
HIGH-RISK PREGNANCY VISIT Patient name: Sara Carpenter MRN [id number]  Date of birth: 08-15-1990 Chief Complaint:   High Risk Gestation (u/s today- gc-chl)  History of Present Illness:   Sara Carpenter is a 29 y.o. [phone] female at [redacted]w[redacted]d with an Estimated Date of Delivery: 08/14/19 being seen today for ongoing management of a high-risk pregnancy complicated by [facility].  Today she reports backache and pelvic pressure.  Depression screen [facility] 2/9 05/04/2019  Decreased Interest 0  Down, Depressed, Hopeless 0  PHQ - 2 Score 0  Altered sleeping 0  Tired, decreased energy 0  Change in appetite 0  Feeling bad or failure about yourself  0  Trouble concentrating 0  Moving slowly or fidgety/restless 0  Suicidal thoughts 0  PHQ-9 Score 0  Difficult doing work/chores Not difficult at all    Contractions: Not present.  .  Movement: Present. denies leaking of fluid.  Review of Systems:   Pertinent items are noted in HPI Denies abnormal vaginal discharge w/ itching/odor/irritation, headaches, visual changes, shortness of breath, chest pain, abdominal pain, severe nausea/vomiting, or problems with urination or bowel movements unless otherwise stated above. Pertinent History Reviewed:  Reviewed past medical,surgical, social, obstetrical and family history.  Reviewed problem list, medications and allergies. Physical Assessment:   Vitals:   07/24/19 1114  BP: 120/74  Pulse: (!) 111  Weight: (!) 338 lb 6.4 oz (153.5 kg)  Body mass index is 53 kg/m.           Physical Examination:   General appearance: alert, well appearing, and in no distress  Mental status: alert, oriented to person, place, and time  Skin: warm & dry   Extremities: Edema: None    Cardiovascular: normal heart rate noted  Respiratory: normal respiratory effort, no distress  Abdomen: gravid, soft, non-tender  Pelvic: Cervical exam performed         Fetal Status:     Movement: Present    Fetal Surveillance Testing  today: BPP 8/8 w/normal Dopplers   Chaperone: Malachy Mood    Results for orders placed or performed in visit on 07/24/19 (from the past 24 hour(s))  POC Urinalysis Dipstick OB   Collection Time: 07/24/19 11:27 AM  Result Value Ref Range   Color, UA     Clarity, UA     Glucose, UA Negative Negative   Bilirubin, UA     Ketones, UA small    Spec Grav, UA     Blood, UA neg    pH, UA     POC,PROTEIN,UA Negative Negative, Trace, Small (1+), Moderate (2+), Large (3+), 4+   Urobilinogen, UA     Nitrite, UA neg    Leukocytes, UA Negative Negative   Appearance     Odor      Assessment & Plan:  1) High-risk pregnancy [id number] at [redacted]w[redacted]d with an Estimated Date of Delivery: 08/14/19   2) CHTN, stable, on labetalol 200 mg BID, reassuring testing and good BP control today  Meds: No orders of the defined types were placed in this encounter.   Labs/procedures today: BPP  Treatment Plan:  Twice weekly surveillance, sonogram alternating with NST, induction at 39 weeks or as clinically indicated   Reviewed: Term labor symptoms and general obstetric precautions including but not limited to vaginal bleeding, contractions, leaking of fluid and fetal movement were reviewed in detail with the patient.  All questions were answered. Has home bp cuff. Rx faxed to . Check bp weekly, let us know if >  140/90.   Follow-up: No follow-ups on file.  Orders Placed This Encounter  Procedures  . POC Urinalysis Dipstick OB   Lazaro Arms  07/24/2019 11:34 AM

## 2019-07-27 ENCOUNTER — Ambulatory Visit (INDEPENDENT_AMBULATORY_CARE_PROVIDER_SITE_OTHER): Payer: Medicaid Other | Admitting: Obstetrics & Gynecology

## 2019-07-27 ENCOUNTER — Ambulatory Visit (INDEPENDENT_AMBULATORY_CARE_PROVIDER_SITE_OTHER): Payer: Medicaid Other

## 2019-07-27 ENCOUNTER — Encounter: Payer: Self-pay | Admitting: Obstetrics & Gynecology

## 2019-07-27 VITALS — BP 132/83 | HR 111 | Wt 340.0 lb

## 2019-07-27 DIAGNOSIS — Z331 Pregnant state, incidental: Secondary | ICD-10-CM

## 2019-07-27 DIAGNOSIS — O10919 Unspecified pre-existing hypertension complicating pregnancy, unspecified trimester: Secondary | ICD-10-CM

## 2019-07-27 DIAGNOSIS — Z3A37 37 weeks gestation of pregnancy: Secondary | ICD-10-CM

## 2019-07-27 DIAGNOSIS — O0990 Supervision of high risk pregnancy, unspecified, unspecified trimester: Secondary | ICD-10-CM

## 2019-07-27 DIAGNOSIS — O10913 Unspecified pre-existing hypertension complicating pregnancy, third trimester: Secondary | ICD-10-CM | POA: Diagnosis not present

## 2019-07-27 DIAGNOSIS — Z1389 Encounter for screening for other disorder: Secondary | ICD-10-CM

## 2019-07-27 DIAGNOSIS — O0993 Supervision of high risk pregnancy, unspecified, third trimester: Secondary | ICD-10-CM

## 2019-07-27 LAB — POCT URINALYSIS DIPSTICK OB
Blood, UA: NEGATIVE
Glucose, UA: NEGATIVE
Ketones, UA: NEGATIVE
Leukocytes, UA: NEGATIVE
Nitrite, UA: NEGATIVE
POC,PROTEIN,UA: NEGATIVE

## 2019-07-27 LAB — CERVICOVAGINAL ANCILLARY ONLY
Chlamydia: NEGATIVE
Comment: NEGATIVE
Comment: NORMAL
Neisseria Gonorrhea: NEGATIVE

## 2019-07-27 MED ORDER — PANTOPRAZOLE SODIUM 40 MG PO TBEC: 40.0000 mg | DELAYED_RELEASE_TABLET | Freq: Every day | ORAL | 1 refills | Status: DC

## 2019-07-27 NOTE — Progress Notes (Signed)
[place] 37+3 wks,cephalic,BPP 8/8,anterior placenta gr 3,fhr 152 bpm,afi 17 cm,RI .59,52,60=55%

## 2019-07-27 NOTE — Progress Notes (Signed)
HIGH-RISK PREGNANCY VISIT Patient name: Sara Carpenter MRN [id number]  Date of birth: [birth year] Chief Complaint:   High Risk Gestation (u/s today/ back pain)  History of Present Illness:   Sara Carpenter is a 29 y.o. [phone] female at [redacted]w[redacted]d with an Estimated Date of Delivery: 08/14/19 being seen today for ongoing management of a high-risk pregnancy complicated by chronic hypertension currently on labetalol 200 BID.  Today she reports no complaints.  Depression screen [facility] 2/9 05/04/2019  Decreased Interest 0  Down, Depressed, Hopeless 0  PHQ - 2 Score 0  Altered sleeping 0  Tired, decreased energy 0  Change in appetite 0  Feeling bad or failure about yourself  0  Trouble concentrating 0  Moving slowly or fidgety/restless 0  Suicidal thoughts 0  PHQ-9 Score 0  Difficult doing work/chores Not difficult at all    Contractions: Not present.  .  Movement: Present. denies leaking of fluid.  Review of Systems:   Pertinent items are noted in HPI Denies abnormal vaginal discharge w/ itching/odor/irritation, headaches, visual changes, shortness of breath, chest pain, abdominal pain, severe nausea/vomiting, or problems with urination or bowel movements unless otherwise stated above. Pertinent History Reviewed:  Reviewed past medical,surgical, social, obstetrical and family history.  Reviewed problem list, medications and allergies. Physical Assessment:   Vitals:   07/27/19 1108  BP: 132/83  Pulse: (!) 111  Weight: (!) 340 lb (154.2 kg)  Body mass index is 53.25 kg/m.           Physical Examination:   General appearance: alert, well appearing, and in no distress  Mental status: alert, oriented to person, place, and time  Skin: warm & dry   Extremities: Edema: None    Cardiovascular: normal heart rate noted  Respiratory: normal respiratory effort, no distress  Abdomen: gravid, soft, non-tender  Pelvic: Cervical exam deferred         Fetal Status:     Movement: Present     Fetal Surveillance Testing today: BPP 8/8 w/normal Dopplers   Chaperone: n/a    Results for orders placed or performed in visit on 07/27/19 (from the past 24 hour(s))  POC Urinalysis Dipstick OB   Collection Time: 07/27/19 11:13 AM  Result Value Ref Range   Color, UA     Clarity, UA     Glucose, UA Negative Negative   Bilirubin, UA     Ketones, UA neg    Spec Grav, UA     Blood, UA neg    pH, UA     POC,PROTEIN,UA Negative Negative, Trace, Small (1+), Moderate (2+), Large (3+), 4+   Urobilinogen, UA     Nitrite, UA neg    Leukocytes, UA Negative Negative   Appearance     Odor      Assessment & Plan:  1) High-risk pregnancy [id number] at [redacted]w[redacted]d with an Estimated Date of Delivery: 08/14/19   2) CHTN, stable, reassuring surveillance today  Meds:  Meds ordered this encounter  Medications  . pantoprazole (PROTONIX) 40 MG tablet    Sig: Take 1 tablet (40 mg total) by mouth daily.    Dispense:  30 tablet    Refill:  1    Labs/procedures today: sonogram  Treatment Plan:  Twice weekly surveillance, induction decision next week  Reviewed: Term labor symptoms and general obstetric precautions including but not limited to vaginal bleeding, contractions, leaking of fluid and fetal movement were reviewed in detail with the patient.  All questions were answered. Has  home bp cuff. Rx faxed to . Check bp weekly, let us know if >140/90.   Follow-up: No follow-ups on file.  Orders Placed This Encounter  Procedures  . POC Urinalysis Dipstick OB   Lazaro Arms  07/27/2019 12:16 PM

## 2019-07-30 ENCOUNTER — Ambulatory Visit (INDEPENDENT_AMBULATORY_CARE_PROVIDER_SITE_OTHER): Payer: Medicaid Other | Admitting: *Deleted

## 2019-07-30 VITALS — BP 133/82 | HR 103 | Wt 340.0 lb

## 2019-07-30 DIAGNOSIS — Z1389 Encounter for screening for other disorder: Secondary | ICD-10-CM

## 2019-07-30 DIAGNOSIS — Z331 Pregnant state, incidental: Secondary | ICD-10-CM

## 2019-07-30 DIAGNOSIS — Z3A37 37 weeks gestation of pregnancy: Secondary | ICD-10-CM

## 2019-07-30 DIAGNOSIS — O0990 Supervision of high risk pregnancy, unspecified, unspecified trimester: Secondary | ICD-10-CM

## 2019-07-30 LAB — POCT URINALYSIS DIPSTICK OB
Blood, UA: NEGATIVE
Glucose, UA: NEGATIVE
Ketones, UA: NEGATIVE
Leukocytes, UA: NEGATIVE
Nitrite, UA: NEGATIVE
POC,PROTEIN,UA: NEGATIVE

## 2019-07-30 NOTE — Progress Notes (Signed)
   NURSE VISIT- NST  SUBJECTIVE:  Sara Carpenter is a 29 y.o. [phone] female at [redacted]w[redacted]d, here for a NST for pregnancy complicated by [facility].  She reports active fetal movement, contractions: none, vaginal bleeding: none, membranes: intact.   OBJECTIVE:  BP 133/82   Pulse (!) 103   Wt (!) 340 lb (154.2 kg)   LMP 10/21/2018   BMI 53.25 kg/m   Appears well, no apparent distress  Results for orders placed or performed in visit on 07/30/19 (from the past 24 hour(s))  POC Urinalysis Dipstick OB   Collection Time: 07/30/19 10:46 AM  Result Value Ref Range   Color, UA     Clarity, UA     Glucose, UA Negative Negative   Bilirubin, UA     Ketones, UA neg    Spec Grav, UA     Blood, UA neg    pH, UA     POC,PROTEIN,UA Negative Negative, Trace, Small (1+), Moderate (2+), Large (3+), 4+   Urobilinogen, UA     Nitrite, UA neg    Leukocytes, UA Negative Negative   Appearance     Odor      NST: FHR baseline 140 bpm, Variability: moderate, Accelerations:present, Decelerations:  Absent= Cat 1/Reactive Toco: none   ASSESSMENT: [id number] at [redacted]w[redacted]d with CHTN NST reactive  PLAN: EFM strip reviewed by Dr. Despina Hidden   Recommendations: keep next appointment as scheduled    Annamarie Dawley  07/30/2019 5:22 PM

## 2019-07-31 ENCOUNTER — Other Ambulatory Visit: Payer: Self-pay | Admitting: Obstetrics & Gynecology

## 2019-07-31 DIAGNOSIS — O10919 Unspecified pre-existing hypertension complicating pregnancy, unspecified trimester: Secondary | ICD-10-CM

## 2019-08-03 ENCOUNTER — Ambulatory Visit (INDEPENDENT_AMBULATORY_CARE_PROVIDER_SITE_OTHER): Payer: Medicaid Other | Admitting: Obstetrics and Gynecology

## 2019-08-03 ENCOUNTER — Ambulatory Visit (INDEPENDENT_AMBULATORY_CARE_PROVIDER_SITE_OTHER): Payer: Medicaid Other

## 2019-08-03 ENCOUNTER — Telehealth (HOSPITAL_COMMUNITY): Payer: Self-pay | Admitting: *Deleted

## 2019-08-03 ENCOUNTER — Encounter: Payer: Self-pay | Admitting: Obstetrics and Gynecology

## 2019-08-03 VITALS — BP 137/91 | HR 110 | Wt 339.8 lb

## 2019-08-03 DIAGNOSIS — O10913 Unspecified pre-existing hypertension complicating pregnancy, third trimester: Secondary | ICD-10-CM

## 2019-08-03 DIAGNOSIS — O10919 Unspecified pre-existing hypertension complicating pregnancy, unspecified trimester: Secondary | ICD-10-CM

## 2019-08-03 DIAGNOSIS — Z3A38 38 weeks gestation of pregnancy: Secondary | ICD-10-CM

## 2019-08-03 DIAGNOSIS — Z331 Pregnant state, incidental: Secondary | ICD-10-CM

## 2019-08-03 DIAGNOSIS — O0993 Supervision of high risk pregnancy, unspecified, third trimester: Secondary | ICD-10-CM

## 2019-08-03 DIAGNOSIS — Z1389 Encounter for screening for other disorder: Secondary | ICD-10-CM

## 2019-08-03 DIAGNOSIS — O0990 Supervision of high risk pregnancy, unspecified, unspecified trimester: Secondary | ICD-10-CM

## 2019-08-03 LAB — POCT URINALYSIS DIPSTICK OB
Blood, UA: NEGATIVE
Glucose, UA: NEGATIVE
Ketones, UA: NEGATIVE
Leukocytes, UA: NEGATIVE
Nitrite, UA: NEGATIVE
POC,PROTEIN,UA: NEGATIVE

## 2019-08-03 NOTE — Progress Notes (Signed)
Patient ID: Sara Carpenter, female   DOB: [birth year], 29 y.o.   MRN: [id number]    [facility] PREGNANCY VISIT Patient name: Sara Carpenter MRN [id number]  Date of birth: Jul 24, 1990 Chief Complaint:   High Risk Gestation ([place] today)  History of Present Illness:   Sara Carpenter is a 29 y.o. [phone] female at [redacted]w[redacted]d with an Estimated Date of Delivery: 08/14/19 being seen today for ongoing management of a high-risk pregnancy complicated by [facility] currently on Labetalol 200 mg.  Today she reports no complaints. She ran out of her Labetalol yesterday and is going to pick it up today. She has not been checking her blood pressure at home because she has been coming in so often. She would like to use an IUD for birth control after delivery.  This is her second pregnancy and the EFW is 11 lbs 5 oz. She is nervous about delivering vaginally. Her first baby was 8.5 lbs and she was induced. She was in labor for 26 hours and pushed for 1.5 hours. The baby got stuck and "his head was lopsided for a while."  Contractions: Not present. Vag. Bleeding: None.  Movement: Present. denies leaking of fluid.  Review of Systems:   Pertinent items are noted in HPI Denies abnormal vaginal discharge w/ itching/odor/irritation, headaches, visual changes, shortness of breath, chest pain, abdominal pain, severe nausea/vomiting, or problems with urination or bowel movements unless otherwise stated above. Pertinent History Reviewed:  Reviewed past medical,surgical, social, obstetrical and family history.  Reviewed problem list, medications and allergies. Physical Assessment:   Vitals:   08/03/19 1118  BP: (!) 137/91  Pulse: (!) 110  Weight: (!) 339 lb 12.8 oz (154.1 kg)  Body mass index is 53.22 kg/m.           Physical Examination:   General appearance: alert, well appearing, and in no distress and oriented to person, place, and time  Mental status: alert, oriented to person, place, and time, normal mood,  behavior, speech, dress, motor activity, and thought processes, affect appropriate to mood  Skin: warm & dry   Extremities: Edema: Trace    Cardiovascular: normal heart rate noted  Respiratory: normal respiratory effort, no distress  Abdomen: gravid, soft, non-tender  Pelvic: Cervical exam performed  Dilation: 1      Fetal Status: Fetal Heart Rate (bpm): 138 u/s Fundal Height: 47 cm Movement: Present   FH: U + 25  Fetal Surveillance Testing today: [place] 38+3 wks,cephalic,BPP 8/8,RI .54,.53,.50,.61=50%,AFI 17 cm,fhr 138 bpm,anterior placenta gr 3  Results for orders placed or performed in visit on 08/03/19 (from the past 24 hour(s))  POC Urinalysis Dipstick OB   Collection Time: 08/03/19 11:20 AM  Result Value Ref Range   Color, UA     Clarity, UA     Glucose, UA Negative Negative   Bilirubin, UA     Ketones, UA neg    Spec Grav, UA     Blood, UA neg    pH, UA     POC,PROTEIN,UA Negative Negative, Trace, Small (1+), Moderate (2+), Large (3+), 4+   Urobilinogen, UA     Nitrite, UA neg    Leukocytes, UA Negative Negative   Appearance     Odor      Assessment & Plan:  1) High-risk pregnancy [id number] at [redacted]w[redacted]d with an Estimated Date of Delivery: 08/14/19   2) CHTN, stable on Labetalol 200 mg BID  3) Group B strep +, urine culture on 05/04/2019  4) C section  scheduled for Friday, 08/07/2019 at 2:00 pm, IUD insertion  Meds: No orders of the defined types were placed in this encounter.  Labs/procedures today: none  Treatment Plan: C section scheduled for Friday 08/07/2019 at 2:00 pm.  Reviewed: Term labor symptoms and general obstetric precautions including but not limited to vaginal bleeding, contractions, leaking of fluid and fetal movement were reviewed in detail with the patient.  All questions were answered.  Check bp weekly, let us know if >140/90.   Follow-up: Return in about 2 weeks (around 08/17/2019) for Postpartum chk.  By signing my name below, I, Pietro Cassis, attest  that this documentation has been prepared under the direction and in the presence of Tilda Burrow, MD. Electronically Signed: Pietro Cassis, Medical Scribe. 08/03/19. 12:05 PM.  I personally performed the services described in this documentation, which was SCRIBED in my presence. The recorded information has been reviewed and considered accurate. It has been edited as necessary during review. Tilda Burrow, MD

## 2019-08-03 NOTE — Anesthesia Preprocedure Evaluation (Addendum)
Anesthesia Evaluation  Patient identified by MRN, date of birth, ID band Patient awake    Reviewed: Allergy & Precautions, NPO status , Patient's Chart, lab work & pertinent test results, reviewed documented beta blocker date and time   History of Anesthesia Complications Negative for: history of anesthetic complications  Airway Mallampati: II  TM Distance: >3 FB Neck ROM: Full    Dental  (+) Missing,    Pulmonary former smoker,    Pulmonary exam normal        Cardiovascular hypertension, Pt. on medications and Pt. on home beta blockers Normal cardiovascular exam     Neuro/Psych  Headaches, Anxiety Depression Bipolar Disorder    GI/Hepatic Neg liver ROS, GERD  Medicated and Controlled,  Endo/Other  Morbid obesity (BMI 55)  Renal/GU negative Renal ROS  negative genitourinary   Musculoskeletal negative musculoskeletal ROS (+)   Abdominal   Peds  Hematology negative hematology ROS (+)   Anesthesia Other Findings Day of surgery medications reviewed with patient.  Reproductive/Obstetrics (+) Pregnancy (Suspected LGA fetus)                           Anesthesia Physical Anesthesia Plan  ASA: III  Anesthesia Plan: Spinal   Post-op Pain Management:    Induction:   PONV Risk Score and Plan: 4 or greater and Treatment may vary due to age or medical condition, Ondansetron and Dexamethasone  Airway Management Planned: Natural Airway  Additional Equipment: None  Intra-op Plan:   Post-operative Plan:   Informed Consent: I have reviewed the patients History and Physical, chart, labs and discussed the procedure including the risks, benefits and alternatives for the proposed anesthesia with the patient or authorized representative who has indicated his/her understanding and acceptance.       Plan Discussed with: CRNA  Anesthesia Plan Comments:        Anesthesia Quick Evaluation

## 2019-08-03 NOTE — Patient Instructions (Signed)
Saori Umholtz Sinha  08/03/2019   Your procedure is scheduled on:  08/07/2019  Arrive at 1215 at Entrance C on [employer] at [facility]  and [employer]. You are invited to use the FREE valet parking or use the Visitor's parking deck.  Pick up the phone at the desk and dial [phone].  Call this number if you have problems the morning of surgery: [phone]  Remember:   Do not eat food:(After Midnight) Desps de medianoche.  Do not drink clear liquids: (After Midnight) Desps de medianoche.  Take these medicines the morning of surgery with A SIP OF WATER:  Take labetalol and acyvlovir as prescribed   Do not wear jewelry, make-up or nail polish.  Do not wear lotions, powders, or perfumes. Do not wear deodorant.  Do not shave 48 hours prior to surgery.  Do not bring valuables to the hospital.  [facility] is not   responsible for any belongings or valuables brought to the hospital.  Contacts, dentures or bridgework may not be worn into surgery.  Leave suitcase in the car. After surgery it may be brought to your room.  For patients admitted to the hospital, checkout time is 11:00 AM the day of              discharge.      Please read over the following fact sheets that you were given:     Preparing for Surgery

## 2019-08-03 NOTE — Progress Notes (Signed)
[place] 38+3 wks,cephalic,BPP 8/8,RI .54,.53,.50,.61=50%,AFI 17 cm,fhr 138 bpm,anterior placenta gr 3

## 2019-08-03 NOTE — Telephone Encounter (Signed)
Preadmission screen Message sent via MyChart

## 2019-08-04 ENCOUNTER — Encounter (HOSPITAL_COMMUNITY): Payer: Self-pay

## 2019-08-04 ENCOUNTER — Other Ambulatory Visit (INDEPENDENT_AMBULATORY_CARE_PROVIDER_SITE_OTHER): Payer: Medicaid Other | Admitting: Obstetrics and Gynecology

## 2019-08-04 NOTE — H&P (Deleted)
  The note originally documented on this encounter has been moved the the encounter in which it belongs.  

## 2019-08-04 NOTE — H&P (Signed)
Obstetric Preoperative History and Physical  Sara Carpenter is a 29 y.o. [id number] with IUP at [redacted]w[redacted]d presenting for scheduled primary cesarean section for suspected fetal macrosomia.  Reports good fetal movement, no bleeding, no contractions, no leaking of fluid.  No acute preoperative concerns.    Cesarean Section Indication: macrosomia 1 Prenatal Course Source of Care: [facility]  with onset of care at 10 weeks Pregnancy complications or risks: Patient Active Problem List   Diagnosis Date Noted  . Supervision of high risk pregnancy, antepartum 05/04/2019    Priority: Low  . Chronic hypertension affecting pregnancy 05/04/2019    Priority: Low  . Morbid obesity (HCC)     Priority: Low  . GBS bacteriuria 05/11/2019  . Abnormal Pap smear of cervix 05/11/2019  . Marijuana use 05/05/2019  . 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 Bipolar disorder (HCC) 02/27/2016  . History of PCOS 02/27/2016  . Sebaceous cyst 12/10/2012  hx HSV not currently active She undecided She desires IUD for postpartum contraception.   Prenatal labs and studies: ABO, Rh: O/Positive/-- (04/19 1105) Antibody: Negative (04/19 1105) Rubella: 1.93 (04/19 1105) RPR: Non Reactive (04/19 1105)  HBsAg: Negative (04/19 1105)  HIV: Non Reactive (04/19 1105)  GBS:  1 hr Glucola  Normal 2 hr gtt.87/120/104 Genetic screening normal Anatomy US normal Growth u/s 36 wk projects to 11 lb 5 oz and pt had difficutly delivering her las 8 7 infant Prenatal Transfer Tool  Maternal Diabetes: No Genetic Screening: Normal Maternal Ultrasounds/Referrals: Other:macrosomia 99%ile plus Fetal Ultrasounds or other Referrals:  None Maternal Substance Abuse:  No Significant Maternal Medications:  None Significant Maternal Lab Results: None  Past Medical History:  Diagnosis Date  . Anxiety   . Bipolar 1 disorder (HCC)   . Depression    is fine  . Headache   . Infected tooth   . Infection    UTI  . Obesity   . PCOS  (polycystic ovarian syndrome)    "When I was young". No medical intervention.  . Vaginal Pap smear, abnormal    did not f/u    Past Surgical History:  Procedure Laterality Date  . DILATION AND CURETTAGE OF UTERUS N/A 09/03/2012   Procedure: SUCTION DILATATION AND CURETTAGE;  Surgeon: Lazaro Arms, MD;  Location: [facility];  Service: Gynecology;  Laterality: N/A;  . Migraine headache    . TONSILLECTOMY      OB History  Gravida Para Term Preterm AB Living  4 1 1   2 1   SAB TAB Ectopic Multiple Live Births  2       1    # Outcome Date GA Lbr Len/2nd Weight Sex Delivery Anes PTL Lv  4 Current           3 Term 05/14/14 [redacted]w[redacted]d  8 lb 7 oz (3.827 kg) M Vag-Spont EPI N LIV  2 SAB 2015          1 SAB 2014            Social History   Socioeconomic History  . Marital status: Single    Spouse name: Not on file  . Number of children: Not on file  . Years of education: Not on file  . Highest education level: Not on file  Occupational History  . Not on file  Tobacco Use  . Smoking status: Former Smoker    Packs/day: 0.00    Years: 2.00    Pack years: 0.00    Types: Cigarettes  Quit date: 08/25/2012    Years since quitting: 6.9  . Smokeless tobacco: Never Used  Vaping Use  . Vaping Use: Never used  Substance and Sexual Activity  . Alcohol use: Not Currently    Comment: occas  . Drug use: Yes    Types: Marijuana    Comment: week of 7/13  . Sexual activity: Yes    Birth control/protection: None    Comment: last intercourse 2 days ago  Other Topics Concern  . Not on file  Social History Narrative  . Not on file   Social Determinants of Health   Financial Resource Strain: Low Risk   . Difficulty of Paying Living Expenses: Not very hard  Food Insecurity: Food Insecurity Present  . Worried About Programme researcher, broadcasting/film/video in the Last Year: Sometimes true  . Ran Out of Food in the Last Year: Sometimes true  Transportation Needs: No Transportation Needs  . Lack of Transportation  (Medical): No  . Lack of Transportation (Non-Medical): No  Physical Activity: Inactive  . Days of Exercise per Week: 0 days  . Minutes of Exercise per Session: 0 min  Stress: No Stress Concern Present  . Feeling of Stress : Not at all  Social Connections: Socially Isolated  . Frequency of Communication with Friends and Family: Twice a week  . Frequency of Social Gatherings with Friends and Family: Never  . Attends Religious Services: Never  . Active Member of Clubs or Organizations: No  . Attends Banker Meetings: Never  . Marital Status: Living with partner    Family History  Problem Relation Age of Onset  . Diabetes Paternal Grandmother   . Diabetes Paternal Grandfather   . Cancer Mother        cervical  . Cancer Paternal Uncle        lung  . Cancer Maternal Grandmother        lung  . Cancer Maternal Grandfather        throat  . Healthy Father   . Kidney disease Sister     (Not in a hospital admission)   Allergies  Allergen Reactions  . Carbamazepine Nausea And Vomiting    Review of Systems: Pertinent items noted in HPI and remainder of comprehensive ROS otherwise negative.  Physical Exam: LMP 10/21/2018  FHR by Doppler: 138 bpm CONSTITUTIONAL: Well-developed, well-nourished female in no acute distress.  HENT:  Normocephalic, atraumatic, External right and left ear normal. Oropharynx is clear and moist EYES: Conjunctivae and EOM are normal. Pupils are equal, round, and reactive to light. No scleral icterus.  NECK: Normal range of motion, supple, no masses SKIN: Skin is warm and dry. No rash noted. Not diaphoretic. No erythema. No pallor. NEUROLGIC: Alert and oriented to person, place, and time. Normal reflexes, muscle tone coordination. No cranial nerve deficit noted. PSYCHIATRIC: Normal mood and affect. Normal behavior. Normal judgment and thought content. CARDIOVASCULAR: Normal heart rate noted, regular rhythm RESPIRATORY: Effort and breath sounds  normal, no problems with respiration noted ABDOMEN: Soft, nontender, nondistended, gravid. 45-47 cm FH  PELVIC: Deferred MUSCULOSKELETAL: Normal range of motion. No edema and no tenderness. 2+ distal pulses.  Pertinent Labs/Studies:   Results for orders placed or performed in visit on 08/03/19 (from the past 72 hour(s))  POC Urinalysis Dipstick OB     Status: None   Collection Time: 08/03/19 11:20 AM  Result Value Ref Range   Color, UA     Clarity, UA     Glucose, UA  Negative Negative   Bilirubin, UA     Ketones, UA neg    Spec Grav, UA     Blood, UA neg    pH, UA     POC,PROTEIN,UA Negative Negative, Trace, Small (1+), Moderate (2+), Large (3+), 4+   Urobilinogen, UA     Nitrite, UA neg    Leukocytes, UA Negative Negative   Appearance     Odor      Assessment and Plan: Sara Carpenter is a 29 y.o. [id number] at [redacted]w[redacted]d being admitted for scheduled cesarean section. The risks of surgery were discussed with the patient including but were not limited to: bleeding which may require transfusion or reoperation; infection which may require antibiotics; injury to bowel, bladder, ureters or other surrounding organs; injury to the fetus; need for additional procedures including hysterectomy in the event of a life-threatening hemorrhage; formation of adhesions; placental abnormalities wth subsequent pregnancies; incisional problems; thromboembolic phenomenon and other postoperative/anesthesia complications. The patient concurred with the proposed plan, giving informed written consent for the procedure. Patient has been NPO since last night she will remain NPO for procedure. Anesthesia and OR aware. Preoperative prophylactic antibiotics and SCDs ordered on call to the OR. To OR when ready.    Tilda Burrow, MD Obstetrician & Gynecologist, Faculty [facility] for [facility] at [facility], [facility]

## 2019-08-05 ENCOUNTER — Other Ambulatory Visit (HOSPITAL_COMMUNITY)
Admission: RE | Admit: 2019-08-05 | Discharge: 2019-08-05 | Disposition: A | Discharge status: Home / Self Care | Payer: Medicaid Other | Source: Ambulatory Visit | Attending: Obstetrics and Gynecology | Admitting: Obstetrics and Gynecology

## 2019-08-05 ENCOUNTER — Other Ambulatory Visit: Payer: Self-pay

## 2019-08-05 DIAGNOSIS — Z20822 Contact with and (suspected) exposure to covid-19: Secondary | ICD-10-CM | POA: Diagnosis present

## 2019-08-05 HISTORY — DX: Headache, unspecified: R51.9

## 2019-08-05 LAB — TYPE AND SCREEN
ABO/RH(D): O POS
Antibody Screen: NEGATIVE

## 2019-08-05 LAB — CBC
HCT: 34.4 % — ABNORMAL LOW (ref 36.0–46.0)
Hemoglobin: 11.2 g/dL — ABNORMAL LOW (ref 12.0–15.0)
MCH: 29.6 pg (ref 26.0–34.0)
MCHC: 32.6 g/dL (ref 30.0–36.0)
MCV: 91 fL (ref 80.0–100.0)
Platelets: 383 10*3/uL (ref 150–400)
RBC: 3.78 MIL/uL — ABNORMAL LOW (ref 3.87–5.11)
RDW: 14 % (ref 11.5–15.5)
WBC: 15.1 10*3/uL — ABNORMAL HIGH (ref 4.0–10.5)
nRBC: 0 % (ref 0.0–0.2)

## 2019-08-05 LAB — SARS CORONAVIRUS 2 (TAT 6-24 HRS): SARS Coronavirus 2: NEGATIVE

## 2019-08-05 LAB — RPR: RPR Ser Ql: NONREACTIVE

## 2019-08-05 NOTE — MAU Note (Signed)
Asymptomatic, swab collected. Waiting on lab 

## 2019-08-06 ENCOUNTER — Other Ambulatory Visit: Payer: Medicaid Other

## 2019-08-07 ENCOUNTER — Encounter (HOSPITAL_COMMUNITY)
Admission: RE | Disposition: A | Discharge status: Home / Self Care | Payer: Self-pay | Source: Home / Self Care | Attending: Obstetrics and Gynecology

## 2019-08-07 ENCOUNTER — Inpatient Hospital Stay (HOSPITAL_COMMUNITY): Payer: Medicaid Other | Admitting: Anesthesiology

## 2019-08-07 ENCOUNTER — Inpatient Hospital Stay (HOSPITAL_COMMUNITY): Admission: RE | Admit: 2019-08-07 | Payer: Medicaid Other

## 2019-08-07 ENCOUNTER — Inpatient Hospital Stay (HOSPITAL_COMMUNITY)
Admission: RE | Admit: 2019-08-07 | Discharge: 2019-08-09 | DRG: 787 | Disposition: A | Discharge status: Home / Self Care | Payer: Medicaid Other | Attending: Obstetrics and Gynecology | Admitting: Obstetrics and Gynecology

## 2019-08-07 ENCOUNTER — Encounter (HOSPITAL_COMMUNITY): Payer: Self-pay | Admitting: Obstetrics and Gynecology

## 2019-08-07 DIAGNOSIS — O9832 Other infections with a predominantly sexual mode of transmission complicating childbirth: Secondary | ICD-10-CM | POA: Diagnosis present

## 2019-08-07 DIAGNOSIS — O99214 Obesity complicating childbirth: Secondary | ICD-10-CM | POA: Diagnosis present

## 2019-08-07 DIAGNOSIS — R87619 Unspecified abnormal cytological findings in specimens from cervix uteri: Secondary | ICD-10-CM | POA: Diagnosis present

## 2019-08-07 DIAGNOSIS — O0990 Supervision of high risk pregnancy, unspecified, unspecified trimester: Secondary | ICD-10-CM

## 2019-08-07 DIAGNOSIS — O1002 Pre-existing essential hypertension complicating childbirth: Secondary | ICD-10-CM | POA: Diagnosis present

## 2019-08-07 DIAGNOSIS — Z30430 Encounter for insertion of intrauterine contraceptive device: Secondary | ICD-10-CM | POA: Diagnosis not present

## 2019-08-07 DIAGNOSIS — O3663X0 Maternal care for excessive fetal growth, third trimester, not applicable or unspecified: Principal | ICD-10-CM | POA: Diagnosis present

## 2019-08-07 DIAGNOSIS — Z87891 Personal history of nicotine dependence: Secondary | ICD-10-CM | POA: Diagnosis not present

## 2019-08-07 DIAGNOSIS — A6000 Herpesviral infection of urogenital system, unspecified: Secondary | ICD-10-CM | POA: Diagnosis present

## 2019-08-07 DIAGNOSIS — Z3A39 39 weeks gestation of pregnancy: Secondary | ICD-10-CM

## 2019-08-07 DIAGNOSIS — R768 Other specified abnormal immunological findings in serum: Secondary | ICD-10-CM | POA: Diagnosis present

## 2019-08-07 DIAGNOSIS — Z3A38 38 weeks gestation of pregnancy: Secondary | ICD-10-CM

## 2019-08-07 DIAGNOSIS — O10919 Unspecified pre-existing hypertension complicating pregnancy, unspecified trimester: Secondary | ICD-10-CM | POA: Diagnosis present

## 2019-08-07 DIAGNOSIS — Z975 Presence of (intrauterine) contraceptive device: Secondary | ICD-10-CM

## 2019-08-07 DIAGNOSIS — F319 Bipolar disorder, unspecified: Secondary | ICD-10-CM | POA: Diagnosis present

## 2019-08-07 DIAGNOSIS — R8271 Bacteriuria: Secondary | ICD-10-CM | POA: Diagnosis present

## 2019-08-07 LAB — ABO/RH: ABO/RH(D): O POS

## 2019-08-07 SURGERY — Surgical Case
Anesthesia: Spinal

## 2019-08-07 MED ORDER — NALOXONE HCL 4 MG/10ML IJ SOLN
1.0000 ug/kg/h | INTRAVENOUS | Status: DC | PRN
  Filled 2019-08-07: qty 5

## 2019-08-07 MED ORDER — NALBUPHINE HCL 10 MG/ML IJ SOLN
5.0000 mg | INTRAMUSCULAR | Status: DC | PRN
  Administered 2019-08-07 (×2): 5 mg via SUBCUTANEOUS

## 2019-08-07 MED ORDER — SENNOSIDES-DOCUSATE SODIUM 8.6-50 MG PO TABS
2.0000 | ORAL_TABLET | ORAL | Status: DC
  Administered 2019-08-07 – 2019-08-08 (×2): 2 via ORAL
  Filled 2019-08-07 (×2): qty 2

## 2019-08-07 MED ORDER — DEXAMETHASONE SODIUM PHOSPHATE 10 MG/ML IJ SOLN
INTRAMUSCULAR | Status: AC
  Filled 2019-08-07: qty 1

## 2019-08-07 MED ORDER — LABETALOL HCL 200 MG PO TABS: 200.0000 mg | ORAL_TABLET | Freq: Two times a day (BID) | ORAL | Status: DC

## 2019-08-07 MED ORDER — ENOXAPARIN SODIUM 80 MG/0.8ML ~~LOC~~ SOLN
80.0000 mg | SUBCUTANEOUS | Status: DC
  Administered 2019-08-08 – 2019-08-09 (×2): 80 mg via SUBCUTANEOUS
  Filled 2019-08-07 (×2): qty 0.8

## 2019-08-07 MED ORDER — PHENYLEPHRINE 40 MCG/ML (10ML) SYRINGE FOR IV PUSH (FOR BLOOD PRESSURE SUPPORT)
PREFILLED_SYRINGE | INTRAVENOUS | Status: AC
  Filled 2019-08-07: qty 10

## 2019-08-07 MED ORDER — LACTATED RINGERS IV SOLN: INTRAVENOUS | Status: DC

## 2019-08-07 MED ORDER — SODIUM CHLORIDE 0.9% FLUSH: 3.0000 mL | INTRAVENOUS | Status: DC | PRN

## 2019-08-07 MED ORDER — PHENYLEPHRINE HCL-NACL 20-0.9 MG/250ML-% IV SOLN
INTRAVENOUS | Status: DC | PRN
  Administered 2019-08-07: 110 ug/min via INTRAVENOUS

## 2019-08-07 MED ORDER — DEXTROSE 5 % IV SOLN
INTRAVENOUS | Status: AC
  Filled 2019-08-07: qty 3000

## 2019-08-07 MED ORDER — DEXAMETHASONE SODIUM PHOSPHATE 10 MG/ML IJ SOLN
INTRAMUSCULAR | Status: DC | PRN
  Administered 2019-08-07: 10 mg via INTRAVENOUS

## 2019-08-07 MED ORDER — DIPHENHYDRAMINE HCL 25 MG PO CAPS: 25.0000 mg | ORAL_CAPSULE | ORAL | Status: DC | PRN

## 2019-08-07 MED ORDER — DIPHENHYDRAMINE HCL 50 MG/ML IJ SOLN
INTRAMUSCULAR | Status: AC
  Filled 2019-08-07: qty 1

## 2019-08-07 MED ORDER — MORPHINE SULFATE (PF) 0.5 MG/ML IJ SOLN
INTRAMUSCULAR | Status: AC
  Filled 2019-08-07: qty 10

## 2019-08-07 MED ORDER — NALBUPHINE HCL 10 MG/ML IJ SOLN: 5.0000 mg | Freq: Once | INTRAMUSCULAR | Status: AC | PRN

## 2019-08-07 MED ORDER — SIMETHICONE 80 MG PO CHEW
80.0000 mg | CHEWABLE_TABLET | ORAL | Status: DC
  Administered 2019-08-07 – 2019-08-08 (×2): 80 mg via ORAL
  Filled 2019-08-07 (×2): qty 1

## 2019-08-07 MED ORDER — KETOROLAC TROMETHAMINE 30 MG/ML IJ SOLN
30.0000 mg | Freq: Four times a day (QID) | INTRAMUSCULAR | Status: AC
  Administered 2019-08-08: 30 mg via INTRAVENOUS
  Filled 2019-08-07 (×2): qty 1

## 2019-08-07 MED ORDER — NALOXONE HCL 0.4 MG/ML IJ SOLN: 0.4000 mg | INTRAMUSCULAR | Status: DC | PRN

## 2019-08-07 MED ORDER — EPHEDRINE 5 MG/ML INJ
INTRAVENOUS | Status: AC
  Filled 2019-08-07: qty 10

## 2019-08-07 MED ORDER — ACETAMINOPHEN 500 MG PO TABS
1000.0000 mg | ORAL_TABLET | Freq: Four times a day (QID) | ORAL | Status: DC
  Administered 2019-08-07 – 2019-08-09 (×6): 1000 mg via ORAL
  Filled 2019-08-07 (×6): qty 2

## 2019-08-07 MED ORDER — PROMETHAZINE HCL 25 MG/ML IJ SOLN: 6.2500 mg | INTRAMUSCULAR | Status: DC | PRN

## 2019-08-07 MED ORDER — NALBUPHINE HCL 10 MG/ML IJ SOLN
5.0000 mg | INTRAMUSCULAR | Status: DC | PRN
  Administered 2019-08-07: 5 mg via INTRAVENOUS
  Filled 2019-08-07: qty 1

## 2019-08-07 MED ORDER — MORPHINE SULFATE (PF) 0.5 MG/ML IJ SOLN
INTRAMUSCULAR | Status: DC | PRN
  Administered 2019-08-07: 150 ug via INTRATHECAL

## 2019-08-07 MED ORDER — ONDANSETRON HCL 4 MG/2ML IJ SOLN
INTRAMUSCULAR | Status: AC
  Filled 2019-08-07: qty 2

## 2019-08-07 MED ORDER — BUPIVACAINE IN DEXTROSE 0.75-8.25 % IT SOLN
INTRATHECAL | Status: DC | PRN
  Administered 2019-08-07: 1.7 mL via INTRATHECAL

## 2019-08-07 MED ORDER — KETOROLAC TROMETHAMINE 30 MG/ML IJ SOLN
30.0000 mg | Freq: Once | INTRAMUSCULAR | Status: AC
  Administered 2019-08-07: 30 mg via INTRAVENOUS

## 2019-08-07 MED ORDER — ONDANSETRON HCL 4 MG/2ML IJ SOLN: 4.0000 mg | Freq: Three times a day (TID) | INTRAMUSCULAR | Status: DC | PRN

## 2019-08-07 MED ORDER — MENTHOL 3 MG MT LOZG: 1.0000 | LOZENGE | OROMUCOSAL | Status: DC | PRN

## 2019-08-07 MED ORDER — KETOROLAC TROMETHAMINE 30 MG/ML IJ SOLN
INTRAMUSCULAR | Status: AC
  Filled 2019-08-07: qty 1

## 2019-08-07 MED ORDER — DIPHENHYDRAMINE HCL 50 MG/ML IJ SOLN
12.5000 mg | INTRAMUSCULAR | Status: DC | PRN
  Administered 2019-08-07: 12.5 mg via INTRAVENOUS

## 2019-08-07 MED ORDER — SIMETHICONE 80 MG PO CHEW: 80.0000 mg | CHEWABLE_TABLET | ORAL | Status: DC | PRN

## 2019-08-07 MED ORDER — ONDANSETRON HCL 4 MG/2ML IJ SOLN
INTRAMUSCULAR | Status: DC | PRN
  Administered 2019-08-07: 4 mg via INTRAVENOUS

## 2019-08-07 MED ORDER — WITCH HAZEL-GLYCERIN EX PADS: 1.0000 "application " | MEDICATED_PAD | CUTANEOUS | Status: DC | PRN

## 2019-08-07 MED ORDER — FENTANYL CITRATE (PF) 100 MCG/2ML IJ SOLN
INTRAMUSCULAR | Status: DC | PRN
  Administered 2019-08-07: 15 ug via INTRATHECAL

## 2019-08-07 MED ORDER — PRENATAL MULTIVITAMIN CH
1.0000 | ORAL_TABLET | Freq: Every day | ORAL | Status: DC
  Filled 2019-08-07: qty 1

## 2019-08-07 MED ORDER — IBUPROFEN 800 MG PO TABS
800.0000 mg | ORAL_TABLET | Freq: Four times a day (QID) | ORAL | Status: DC | PRN
  Administered 2019-08-08 (×2): 800 mg via ORAL
  Filled 2019-08-07 (×2): qty 1

## 2019-08-07 MED ORDER — PHENYLEPHRINE HCL (PRESSORS) 10 MG/ML IV SOLN
INTRAVENOUS | Status: DC | PRN
  Administered 2019-08-07: 120 ug via INTRAVENOUS
  Administered 2019-08-07: 80 ug via INTRAVENOUS
  Administered 2019-08-07: 160 ug via INTRAVENOUS

## 2019-08-07 MED ORDER — DIBUCAINE (PERIANAL) 1 % EX OINT: 1.0000 "application " | TOPICAL_OINTMENT | CUTANEOUS | Status: DC | PRN

## 2019-08-07 MED ORDER — SODIUM CHLORIDE 0.9 % IR SOLN
Status: DC | PRN
  Administered 2019-08-07: 1000 mL

## 2019-08-07 MED ORDER — POVIDONE-IODINE 10 % EX SWAB
2.0000 "application " | Freq: Once | CUTANEOUS | Status: AC
  Administered 2019-08-07: 2 via TOPICAL

## 2019-08-07 MED ORDER — OXYTOCIN-SODIUM CHLORIDE 30-0.9 UT/500ML-% IV SOLN
INTRAVENOUS | Status: AC
  Filled 2019-08-07: qty 500

## 2019-08-07 MED ORDER — DEXTROSE 5 % IV SOLN
3.0000 g | INTRAVENOUS | Status: AC
  Administered 2019-08-07: 3 g via INTRAVENOUS

## 2019-08-07 MED ORDER — DIPHENHYDRAMINE HCL 25 MG PO CAPS: 25.0000 mg | ORAL_CAPSULE | Freq: Four times a day (QID) | ORAL | Status: DC | PRN

## 2019-08-07 MED ORDER — COCONUT OIL OIL: 1.0000 "application " | TOPICAL_OIL | Status: DC | PRN

## 2019-08-07 MED ORDER — ACETAMINOPHEN 500 MG PO TABS: 1000.0000 mg | ORAL_TABLET | Freq: Once | ORAL | Status: DC

## 2019-08-07 MED ORDER — PHENYLEPHRINE HCL-NACL 20-0.9 MG/250ML-% IV SOLN
INTRAVENOUS | Status: AC
  Filled 2019-08-07: qty 500

## 2019-08-07 MED ORDER — NALBUPHINE HCL 10 MG/ML IJ SOLN
5.0000 mg | Freq: Once | INTRAMUSCULAR | Status: AC | PRN
  Administered 2019-08-07: 5 mg via INTRAVENOUS

## 2019-08-07 MED ORDER — OXYTOCIN-SODIUM CHLORIDE 30-0.9 UT/500ML-% IV SOLN: 2.5000 [IU]/h | INTRAVENOUS | Status: AC

## 2019-08-07 MED ORDER — ACETAMINOPHEN 500 MG PO TABS: 1000.0000 mg | ORAL_TABLET | Freq: Four times a day (QID) | ORAL | Status: DC

## 2019-08-07 MED ORDER — FENTANYL CITRATE (PF) 100 MCG/2ML IJ SOLN
INTRAMUSCULAR | Status: AC
  Filled 2019-08-07: qty 2

## 2019-08-07 MED ORDER — STERILE WATER FOR IRRIGATION IR SOLN
Status: DC | PRN
  Administered 2019-08-07: 1000 mL

## 2019-08-07 MED ORDER — LACTATED RINGERS IV SOLN: INTRAVENOUS | Status: DC | PRN

## 2019-08-07 MED ORDER — SIMETHICONE 80 MG PO CHEW
80.0000 mg | CHEWABLE_TABLET | Freq: Three times a day (TID) | ORAL | Status: DC
  Administered 2019-08-08 – 2019-08-09 (×4): 80 mg via ORAL
  Filled 2019-08-07 (×4): qty 1

## 2019-08-07 MED ORDER — FENTANYL CITRATE (PF) 100 MCG/2ML IJ SOLN: 25.0000 ug | INTRAMUSCULAR | Status: DC | PRN

## 2019-08-07 MED ORDER — KETOROLAC TROMETHAMINE 30 MG/ML IJ SOLN
30.0000 mg | Freq: Four times a day (QID) | INTRAMUSCULAR | Status: AC | PRN
  Administered 2019-08-07: 30 mg via INTRAVENOUS

## 2019-08-07 MED ORDER — TETANUS-DIPHTH-ACELL PERTUSSIS 5-2.5-18.5 LF-MCG/0.5 IM SUSP: 0.5000 mL | Freq: Once | INTRAMUSCULAR | Status: DC

## 2019-08-07 MED ORDER — LEVONORGESTREL 19.5 MCG/DAY IU IUD: INTRAUTERINE_SYSTEM | INTRAUTERINE | Status: AC

## 2019-08-07 MED ORDER — OXYCODONE HCL 5 MG PO TABS
5.0000 mg | ORAL_TABLET | ORAL | Status: DC | PRN
  Administered 2019-08-08 (×2): 10 mg via ORAL
  Administered 2019-08-09 (×2): 5 mg via ORAL
  Filled 2019-08-07: qty 2
  Filled 2019-08-07: qty 1
  Filled 2019-08-07: qty 2
  Filled 2019-08-07: qty 1

## 2019-08-07 MED ORDER — NALBUPHINE HCL 10 MG/ML IJ SOLN
INTRAMUSCULAR | Status: AC
  Filled 2019-08-07: qty 1

## 2019-08-07 MED ORDER — LEVONORGESTREL 19.5 MCG/DAY IU IUD
INTRAUTERINE_SYSTEM | INTRAUTERINE | Status: AC
  Filled 2019-08-07: qty 1

## 2019-08-07 MED ORDER — ACETAMINOPHEN 160 MG/5ML PO SOLN: 1000.0000 mg | Freq: Once | ORAL | Status: DC

## 2019-08-07 MED ORDER — OXYTOCIN-SODIUM CHLORIDE 30-0.9 UT/500ML-% IV SOLN
INTRAVENOUS | Status: DC | PRN
  Administered 2019-08-07 (×2): 30 [IU] via INTRAVENOUS

## 2019-08-07 MED ORDER — MEASLES, MUMPS & RUBELLA VAC IJ SOLR: 0.5000 mL | Freq: Once | INTRAMUSCULAR | Status: DC

## 2019-08-07 MED ORDER — KETOROLAC TROMETHAMINE 30 MG/ML IJ SOLN: 30.0000 mg | Freq: Four times a day (QID) | INTRAMUSCULAR | Status: AC | PRN

## 2019-08-07 SURGICAL SUPPLY — 41 items
BENZOIN TINCTURE PRP APPL 2/3 (GAUZE/BANDAGES/DRESSINGS) ×3 IMPLANT
CHLORAPREP W/TINT 26ML (MISCELLANEOUS) ×3 IMPLANT
CLAMP CORD UMBIL (MISCELLANEOUS) IMPLANT
CLOSURE STERI-STRIP 1/2X4 (GAUZE/BANDAGES/DRESSINGS) ×1
CLOSURE WOUND 1/2 X4 (GAUZE/BANDAGES/DRESSINGS)
CLOTH BEACON ORANGE TIMEOUT ST (SAFETY) ×3 IMPLANT
CLSR STERI-STRIP ANTIMIC 1/2X4 (GAUZE/BANDAGES/DRESSINGS) ×2 IMPLANT
DRSG OPSITE POSTOP 4X10 (GAUZE/BANDAGES/DRESSINGS) ×3 IMPLANT
ELECT REM PT RETURN 9FT ADLT (ELECTROSURGICAL) ×3
ELECTRODE REM PT RTRN 9FT ADLT (ELECTROSURGICAL) ×1 IMPLANT
EXTRACTOR VACUUM KIWI (MISCELLANEOUS) IMPLANT
GLOVE BIOGEL PI IND STRL 7.0 (GLOVE) ×1 IMPLANT
GLOVE BIOGEL PI IND STRL 9 (GLOVE) ×1 IMPLANT
GLOVE BIOGEL PI INDICATOR 7.0 (GLOVE) ×2
GLOVE BIOGEL PI INDICATOR 9 (GLOVE) ×2
GLOVE SS PI 9.0 STRL (GLOVE) ×3 IMPLANT
GOWN STRL REUS W/TWL 2XL LVL3 (GOWN DISPOSABLE) ×3 IMPLANT
GOWN STRL REUS W/TWL LRG LVL3 (GOWN DISPOSABLE) ×3 IMPLANT
NEEDLE HYPO 25X5/8 SAFETYGLIDE (NEEDLE) IMPLANT
NS IRRIG 1000ML POUR BTL (IV SOLUTION) ×3 IMPLANT
PACK C SECTION WH (CUSTOM PROCEDURE TRAY) ×3 IMPLANT
PAD ABD DERMACEA PRESS 5X9 (GAUZE/BANDAGES/DRESSINGS) ×3 IMPLANT
PAD OB MATERNITY 4.3X12.25 (PERSONAL CARE ITEMS) ×3 IMPLANT
PENCIL SMOKE EVAC W/HOLSTER (ELECTROSURGICAL) ×3 IMPLANT
RETAINER VISCERAL (MISCELLANEOUS) ×3 IMPLANT
RTRCTR C-SECT PINK 25CM LRG (MISCELLANEOUS) IMPLANT
RTRCTR C-SECT PINK 34CM XLRG (MISCELLANEOUS) IMPLANT
SPONGE LAP 18X18 RF (DISPOSABLE) ×6 IMPLANT
STRIP CLOSURE SKIN 1/2X4 (GAUZE/BANDAGES/DRESSINGS) IMPLANT
SUT MNCRL 0 VIOLET CTX 36 (SUTURE) ×3 IMPLANT
SUT MONOCRYL 0 CTX 36 (SUTURE) ×6
SUT PLAIN 2 0 XLH (SUTURE) ×3 IMPLANT
SUT VIC AB 0 CT1 27 (SUTURE) ×2
SUT VIC AB 0 CT1 27XBRD ANBCTR (SUTURE) ×1 IMPLANT
SUT VIC AB 2-0 CT1 27 (SUTURE) ×2
SUT VIC AB 2-0 CT1 TAPERPNT 27 (SUTURE) ×1 IMPLANT
SUT VIC AB 4-0 KS 27 (SUTURE) ×3 IMPLANT
SYR BULB IRRIGATION 50ML (SYRINGE) IMPLANT
TOWEL OR 17X24 6PK STRL BLUE (TOWEL DISPOSABLE) ×3 IMPLANT
TRAY FOLEY W/BAG SLVR 14FR LF (SET/KITS/TRAYS/PACK) ×3 IMPLANT
WATER STERILE IRR 1000ML POUR (IV SOLUTION) ×3 IMPLANT

## 2019-08-07 NOTE — Discharge Summary (Addendum)
Postpartum Discharge Summary     Patient Name: Sara Carpenter DOB: [birth year] MRN: [id number]  Date of admission: 08/07/2019 Delivery date:08/07/2019  Delivering provider: Jonnie Kind  Date of discharge: 08/09/2019  Admitting diagnosis: Cesarean delivery delivered [O82] Intrauterine pregnancy: [redacted]w[redacted]d    Secondary diagnosis:  Active Problems:   Morbid obesity (HClay   Bipolar disorder (HJameson   Supervision of high risk pregnancy, antepartum   Chronic hypertension affecting pregnancy   HSV-2 seropositive   GBS bacteriuria   Abnormal Pap smear of cervix   Cesarean delivery delivered   IUD (intrauterine device) in place  Additional problems: none    Discharge diagnosis: Term Pregnancy Delivered                                              Post partum procedures:ppIUD Augmentation: N/A Complications: None  Hospital course: Sceduled C/S   29y.o. yo [id number] admitted to the hospital 08/07/2019 for scheduled cesarean section with the following indication:Macrosomia.Delivery details are as follows:  Membrane Rupture Time/Date: 2:46 PM ,08/07/2019   Delivery Method:C-Section, Low Transverse  Details of operation can be found in separate operative note.  Patient had an uncomplicated postpartum course.  She is ambulating, tolerating a regular diet, passing flatus, and urinating well. Patient is discharged home in stable condition on  08/09/19        Newborn Data: Birth date:08/07/2019  Birth time:2:47 PM  Gender:Female  Living status:Living  Apgars:7 ,9  Weight:4425 g     Magnesium Sulfate received: No BMZ received: No Rhophylac:No MMR:Yes T-DaP:Given prenatally Flu: N/A Transfusion:No  Physical exam  Vitals:   08/08/19 0545 08/08/19 1108 08/08/19 2000 08/09/19 0607  BP: (!) 119/51 124/73 113/77 108/74  Pulse: 86 96 96 82  Resp: _0 Temp: 98.7 F (37.1 C) 99 F (37.2 C) 97.7 F (36.5 C) 97.9 F (36.6 C)  TempSrc: Oral Oral Oral Oral  SpO2:  100%  100% 100%  Weight:      Height:       General: alert, cooperative and no distress Lochia: appropriate Uterine Fundus: firm Incision: Healing well with no significant drainage, No significant erythema, Dressing is clean, dry, and intact DVT Evaluation: No evidence of DVT seen on physical exam. Negative Homan's sign. No cords or calf tenderness. No significant calf/ankle edema. Labs: Lab Results  Component Value Date   WBC 17.8 (H) 08/08/2019   HGB 9.1 (L) 08/08/2019   HCT 28.0 (L) 08/08/2019   MCV 91.5 08/08/2019   PLT 321 08/08/2019   CMP Latest Ref Rng & Units 05/04/2019  Glucose 65 - 99 mg/dL 110(H)  BUN 6 - 20 mg/dL 4(L)  Creatinine 0.57 - 1.00 mg/dL 0.55(L)  Sodium 134 - 144 mmol/L 136  Potassium 3.5 - 5.2 mmol/L 3.9  Chloride 96 - 106 mmol/L 105  CO2 20 - 29 mmol/L 17(L)  Calcium 8.7 - 10.2 mg/dL 9.0  Total Protein 6.0 - 8.5 g/dL 5.8(L)  Total Bilirubin 0.0 - 1.2 mg/dL <0.2  Alkaline Phos 39 - 117 IU/L 76  AST 0 - 40 IU/L 10  ALT 0 - 32 IU/L 5   Edinburgh Score: No flowsheet data found.   After visit meds:  Allergies as of 08/09/2019      Reactions   Carbamazepine Nausea And Vomiting  Medication List    STOP taking these medications   aspirin EC 81 MG tablet   labetalol 200 MG tablet Commonly known as: NORMODYNE     TAKE these medications   acetaminophen 500 MG tablet Commonly known as: TYLENOL Take 2 tablets (1,000 mg total) by mouth every 6 (six) hours. What changed:   when to take this  reasons to take this   acyclovir 400 MG tablet Commonly known as: ZOVIRAX Take 1 tablet (400 mg total) by mouth 3 (three) times daily.   Blood Pressure Monitor Misc For regular home bp monitoring during pregnancy   ferrous sulfate 325 (65 FE) MG tablet Take 1 tablet (325 mg total) by mouth daily with breakfast.   ibuprofen 800 MG tablet Commonly known as: ADVIL Take 1 tablet (800 mg total) by mouth every 6 (six) hours as needed for moderate  pain.   pantoprazole 40 MG tablet Commonly known as: Protonix Take 1 tablet (40 mg total) by mouth daily.   PRENATAL AD PO Take 1 tablet by mouth daily.        Discharge home in stable condition Infant Feeding: Breast Infant Disposition:home with mother Discharge instruction: per After Visit Summary and Postpartum booklet. Activity: Advance as tolerated. Pelvic rest for 6 weeks.  Diet: routine diet Future Appointments: Future Appointments  Date Time Provider [place]  08/17/2019 10:30 AM Florian Buff, MD [facility]   Follow up Visit:  Follow-up Information    [facility]. Schedule an appointment as soon as possible for a visit.   Specialty: Obstetrics and Gynecology Why: Please call to make a appointment for your 6 weeks postpartum visit.  Contact information: [address] [place] [phone]               Please schedule this patient for a In person postpartum visit in 4-6 weeks with the following provider: Any provider. Additional Postpartum F/U:Incision check 1 week and BP check High risk pregnancy complicated by: HTN and GBS bacteriuria, bipolar disorder (not on meds), morbid obesity Delivery mode:  C-Section, Low Transverse  Anticipated Birth Control:  PP IUD placed   08/09/2019 Carmelina Noun, MD   -Patient not in room when I attempted to assess her; however, patient has had uncomplicated PP course and has appropriate follow up scheduled. No barriers to discharge.   Maye Hides CNM

## 2019-08-07 NOTE — Transfer of Care (Signed)
Immediate Anesthesia Transfer of Care Note  Patient: Sara Carpenter  Procedure(s) Performed: CESAREAN SECTION (N/A )  Patient Location: PACU  Anesthesia Type:Spinal  Level of Consciousness: awake, alert  and oriented  Airway & Oxygen Therapy: Patient Spontanous Breathing  Post-op Assessment: Report given to RN and Post -op Vital signs reviewed and stable  Post vital signs: Reviewed and stable  Last Vitals:  Vitals Value Taken Time  BP 106/60 08/07/19 1556  Temp    Pulse 94 08/07/19 1600  Resp 20 08/07/19 1600  SpO2 100 % 08/07/19 1600  Vitals shown include unvalidated device data.  Last Pain:  Vitals:   08/07/19 1243  TempSrc: Oral  PainSc: 0-No pain      Patients Stated Pain Goal: 4 (08/07/19 1243)  Complications: No complications documented.

## 2019-08-07 NOTE — Anesthesia Procedure Notes (Signed)
Spinal  Patient location during procedure: OR Start time: 08/07/2019 2:09 PM End time: 08/07/2019 2:12 PM Staffing Performed: anesthesiologist  Anesthesiologist: Kaylyn Layer, MD Preanesthetic Checklist Completed: patient identified, IV checked, risks and benefits discussed, monitors and equipment checked, pre-op evaluation and timeout performed Spinal Block Patient position: sitting Prep: DuraPrep and site prepped and draped Patient monitoring: heart rate, continuous pulse ox and blood pressure Approach: midline Location: L3-4 Injection technique: single-shot Needle Needle type: Pencan  Needle gauge: 24 G Needle length: 10 cm Assessment Sensory level: T4 Additional Notes Risks, benefits, and alternative discussed. Patient gave consent to procedure. Prepped and draped in sitting position. Clear CSF obtained after one needle redirection. Positive terminal aspiration. No pain or paraesthesias with injection. Patient tolerated procedure well. Vital signs stable. Amalia Greenhouse, MD

## 2019-08-07 NOTE — Lactation Note (Signed)
This note was copied from a baby's chart. Lactation Consultation Note  Patient Name: Sara Carpenter [id number] Date: 08/07/2019 Reason for consult: Initial assessment;Term  P2 mother whose infant is now 54 hours old.  This is a term baby at 39+0 weeks.  Mother breast fed her first child( now 27 years old) for only a few days.  Mother's feeding preference is breast/bottle.   Mother informed me that she plans to only give him the colostrum in the hospital.  After that she will formula feed her baby.  Acknowledged her feeding plan and offered to assist with latching when baby is ready to feed.  Encouraged to feed 8-12 times/24 hours or sooner if baby shows feeding cues.  Reviewed cues.  Mother is familiar with hand expression and has been able to express drops.  She has had drops for a few weeks now.  Container provided and milk storage times reviewed.  Finger feeding demonstrated.  Mother does not have a DEBP for home use.  However, I suspect she will not need one if she plans to formula feed after discharge.  She has a [facility] appointment on Tuesday (4 days from now) and plans to discuss her options at that time.  Suggested she call her RN/LC for latch assistance with the next feeding.  Mother appreciative and planning on asking for help.  Mother's father is currently visiting with mother.  He is planning on leaving but will return if mother needs his help.  RN in room and aware of my visit.   Maternal Data Formula Feeding for Exclusion: Yes Reason for exclusion: Mother's choice to formula and breast feed on admission Has patient been taught Hand Expression?: Yes Does the patient have breastfeeding experience prior to this delivery?: Yes  Feeding Feeding Type: Breast Fed  LATCH Score                   Interventions    Lactation Tools Discussed/Used WIC Program: Yes   Consult Status Consult Status: Follow-up Date: 08/08/19 Follow-up type: In-patient    Dora Sims 08/07/2019, 6:12 PM

## 2019-08-07 NOTE — Op Note (Signed)
[name] PROCEDURE DATE: 08/07/2019  PREOPERATIVE DIAGNOSES: Intrauterine pregnancy at [redacted]w[redacted]d weeks gestation; macrosomia  POSTOPERATIVE DIAGNOSES: The same  PROCEDURE: Primary Low Transverse Cesarean Section  SURGEON:  Dr. Emelda Fear  ASSISTANT:  Dr. Germaine Pomfret, MD and Dr. Crissie Reese, MD  ANESTHESIOLOGY TEAM: Anesthesiologist: Kaylyn Layer, MD CRNA: Elgie Congo, CRNA  INDICATIONS: Sara Carpenter is a 29 y.o. [phone] at [redacted]w[redacted]d here for cesarean section secondary to the indications listed under preoperative diagnoses; please see preoperative note for further details.  The risks of surgery were discussed with the patient including but were not limited to: bleeding which may require transfusion or reoperation; infection which may require antibiotics; injury to bowel, bladder, ureters or other surrounding organs; injury to the fetus; need for additional procedures including hysterectomy in the event of a life-threatening hemorrhage; formation of adhesions; placental abnormalities wth subsequent pregnancies; incisional problems; thromboembolic phenomenon and other postoperative/anesthesia complications.  The patient concurred with the proposed plan, giving informed written consent for the procedure.    FINDINGS:  Viable female infant in cephalic presentation.  Apgars 7 and 9.  Clear amniotic fluid.  Intact placenta, three vessel cord.  Normal uterus, fallopian tubes and ovaries bilaterally.  ANESTHESIA: Spinal  INTRAVENOUS FLUIDS: 2000 ml   ESTIMATED BLOOD LOSS: 790 ml URINE OUTPUT:  75 ml SPECIMENS: none COMPLICATIONS: None immediate  Fetal weight: 4425g  PROCEDURE IN DETAIL:  The patient preoperatively received intravenous antibiotics and had sequential compression devices applied to her lower extremities.  She was then taken to the operating room where spinal anesthesia was administered and was found to be adequate. She was then placed in a dorsal supine position with a  leftward tilt, and prepped and draped in a sterile manner.  A foley catheter was placed into her bladder and attached to constant gravity.  After an adequate timeout was performed, a Pfannenstiel skin incision was made with scalpel and carried through to the underlying layer of fascia. The fascia was incised in the midline, and this incision was extended bilaterally using the Mayo scissors.  Kocher clamps were applied to the superior aspect of the fascial incision and the underlying rectus muscles were dissected off bluntly and sharply.  A similar process was carried out on the inferior aspect of the fascial incision. The rectus muscles were separated in the midline and the peritoneum was entered bluntly. The Alexis self-retaining retractor was introduced into the abdominal cavity.  Attention was turned to the lower uterine segment where a low transverse hysterotomy was made with a scalpel and extended bilaterally bluntly.  The infant was successfully delivered, the cord was clamped and cut after one minute, and the infant was handed over to the awaiting neonatology team. Uterine massage was then administered, and the placenta delivered intact with a three-vessel cord. The uterus was then cleared of clots and debris.  A Liletta IUD was placed at the level of the uterine fundus and the strings were placed through the cervical os with long kelly clamp. The hysterotomy was closed with 0 Monocryl in a running locked fashion, and an imbricating layer was also placed with 0 Monocryl.  The pelvis was cleared of all clot and debris. Hemostasis was confirmed on all surfaces.  The retractor was removed.  The peritoneum was closed with a 2-0 Vicryl running stitch. The fascia was then closed using 0 Vicryl in a running fashion.  The subcutaneous layer was irrigated, reapproximated with 2-0 plain gut interrupted stitches, and the skin was closed with a  4-0 Vicryl subcuticular stitch. The patient tolerated the procedure well.  Sponge, instrument and needle counts were correct x 3.  She was taken to the recovery room in stable condition.   Mart Piggs, MD OB Fellow, [facility] for [facility], [facility]

## 2019-08-07 NOTE — Anesthesia Postprocedure Evaluation (Signed)
Anesthesia Post Note  Patient: Neurosurgeon  Procedure(s) Performed: CESAREAN SECTION (N/A )     Patient location during evaluation: PACU Anesthesia Type: Spinal Level of consciousness: awake and alert and oriented Pain management: pain level controlled Vital Signs Assessment: post-procedure vital signs reviewed and stable Respiratory status: spontaneous breathing, nonlabored ventilation and respiratory function stable Cardiovascular status: blood pressure returned to baseline Postop Assessment: no apparent nausea or vomiting and spinal receding Anesthetic complications: no   No complications documented.  Last Vitals:  Vitals:   08/07/19 1700 08/07/19 1708  BP: 125/65 (!) 107/56  Pulse: 86 91  Resp: 15   Temp:  37.1 C  SpO2: 96% 99%    Last Pain:  Vitals:   08/07/19 1708  TempSrc: Axillary  PainSc:    Pain Goal: Patients Stated Pain Goal: 4 (08/07/19 1243)  LLE Motor Response: Purposeful movement (08/07/19 1700) LLE Sensation: Tingling (08/07/19 1700) RLE Motor Response: Purposeful movement (08/07/19 1700) RLE Sensation: Tingling (08/07/19 1700)     Epidural/Spinal Function Cutaneous sensation: Tingles (08/07/19 1700), Patient able to flex knees: Yes (08/07/19 1700), Patient able to lift hips off bed: Yes (08/07/19 1700), Back pain beyond tenderness at insertion site: No (08/07/19 1700), Progressively worsening motor and/or sensory loss: No (08/07/19 1700), Bowel and/or bladder incontinence post epidural: No (08/07/19 1700)  Kaylyn Layer

## 2019-08-08 ENCOUNTER — Encounter (HOSPITAL_COMMUNITY): Payer: Self-pay | Admitting: Obstetrics and Gynecology

## 2019-08-08 LAB — CBC
HCT: 28 % — ABNORMAL LOW (ref 36.0–46.0)
Hemoglobin: 9.1 g/dL — ABNORMAL LOW (ref 12.0–15.0)
MCH: 29.7 pg (ref 26.0–34.0)
MCHC: 32.5 g/dL (ref 30.0–36.0)
MCV: 91.5 fL (ref 80.0–100.0)
Platelets: 321 10*3/uL (ref 150–400)
RBC: 3.06 MIL/uL — ABNORMAL LOW (ref 3.87–5.11)
RDW: 14 % (ref 11.5–15.5)
WBC: 17.8 10*3/uL — ABNORMAL HIGH (ref 4.0–10.5)
nRBC: 0 % (ref 0.0–0.2)

## 2019-08-08 MED ORDER — FERROUS SULFATE 325 (65 FE) MG PO TABS
325.0000 mg | ORAL_TABLET | Freq: Every day | ORAL | Status: DC
  Administered 2019-08-08 – 2019-08-09 (×2): 325 mg via ORAL
  Filled 2019-08-08 (×2): qty 1

## 2019-08-08 NOTE — Clinical Social Work Maternal (Addendum)
CLINICAL SOCIAL WORK MATERNAL/CHILD NOTE  Patient Details  Name: Sara Carpenter MRN: [id number] Date of Birth: 05-03-1990  Date:  08/08/2019  Clinical Social Worker Initiating Note:  Sara Carpenter, MSW, LCSW Date/Time: Initiated:  08/08/19/1130     Child's Name:  Sara Carpenter   Biological Parents:  Mother, Father (FOB, Sara Carpenter, 12/02/1989)   Need for Interpreter:  None   Reason for Referral:  Behavioral Health Concerns, Current Substance Use/Substance Use During Pregnancy    Address:  [address] [place] [address]    Phone number:  [phone] (home)     Additional phone number: denied  Household Members/Support Persons (HM/SP):   Household Member/Support Person 1, Household Member/Support Person 2   HM/SP Name Relationship DOB or Age  HM/SP -1 Sara Carpenter FOB 12/02/1989  HM/SP -2 Sara Carpenter son 05/14/2014  HM/SP -3        HM/SP -4        HM/SP -5        HM/SP -6        HM/SP -7        HM/SP -8          Natural Supports (not living in the home):      Professional Supports:     Employment: Unemployed   Type of Work:     Education:  [place] arranged:    Museum/gallery curator Resources:  Medicaid   Other Resources:  Physicist, medical , [place] Considerations Which May Impact Care:  none stated  Strengths:  Ability to meet basic needs , Home prepared for child , Pediatrician chosen   Psychotropic Medications:         Pediatrician:    [place]olic[place] area  Lexicographer List:   [employer]  ([place])  [place]      Pediatrician Fax Number:    Risk Factors/Current Problems:  None   Cognitive State:  Insightful , Linear Thinking , Able to Concentrate , Alert    Mood/Affect:  Relaxed , Comfortable , Calm    CSW Assessment: CSW received consult for drug exposed infant.  CSW met with MOB at bedside to offer support  and complete assessment. On arrival, CSW introduced self and stated reason for visit. FOB and infant Sara Carpenter were present, however, after PPD/A and SIDS education, FOB stepped out of room to offer MOB privacy during assessment. MOB and FOB were pleasant and engaged during visit.   CSW provided education regarding the baby blues period vs. perinatal mood disorders, discussed treatment and gave resources for mental health follow up if concerns arise.  CSW recommends self-evaluation during the postpartum time period using the New Mom Checklist from Postpartum Progress and encouraged MOB and FOB to contact a medical professional if symptoms are noted at any time.  Both agreed and denied any questions.   CSW provided review of Sudden Infant Death Syndrome (SIDS) precautions. MOB and FOB stated understanding and denied any questions. MOB confirmed having all needed items for baby including car seat and crib for baby's safe sleep.   During assessment, MOB confired THC use during pregnancy and mental health dx of anxiety. MOB reported quitting marijuana use several weeks ago without plans to restart. MOB explained THC use was a negative coping skill for anxiety, however, she decided a few weeks ago to stop use and depend on  positive strategies such as; meditation, prayer, and focusing on positive things. CSW informed MOB of hospital's infant drug screen policy, infant's negative UDS results, pending CDS results, and CPS report if warranted. MOB stated understanding and denied any questions.  MOB denied any CPS hx regarding any other children.    Regarding mental health dx, MOB reported  bipolar disorder dx at the age of 61, but, stated she has not had any sx, therefore, believes it was misdiagnosed. MOB explains she struggles with generalized anxiety from time to time, however, feels she is currently coping well. MOB denied any other BH dx, SI, HI, or domestic violence. MOB identified FOB as support and declined any  additional resources or supports.    CSW Plan/Description:  No Further Intervention Required/No Barriers to Discharge, Sudden Infant Death Syndrome (SIDS) Education, Perinatal Mood and Anxiety Disorder (PMADs) Education, [place], CSW Will Continue to Monitor Umbilical Cord Tissue Drug Screen Results and Make Report if Warranted   Sara Carpenter D. Sara Carpenter, MSW, LCSW Clinical Social Worker [phone] 08/08/2019, 11:51 AM

## 2019-08-08 NOTE — Lactation Note (Addendum)
This note was copied from a baby's chart. Lactation Consultation Note  Patient Name: Sara Carpenter [id number] Date: 08/08/2019 Reason for consult: Follow-up assessment   P2, Baby 22 hours old.  Mother had planned to breast and formula feed but has been only breastfeeding. 4 voids/2 stools in the last 24 hours.  Praised her for her efforts.  Baby recently breastfed for 20 min. Mother is using NS on L breast occasionally to latch since nipple is inverted. Mother denies questions or concerns.   Feed on demand with cues.  Goal 8-12+ times per day after first 24 hrs.  Place baby STS if not cueing.  Discussed cluster feeding.  Encouraged parents to call if assistance is needed.    Maternal Data    Feeding Feeding Type: Breast Fed  LATCH Score                   Interventions Interventions: Breast feeding basics reviewed  Lactation Tools Discussed/Used     Consult Status Consult Status: Follow-up Date: 08/09/19 Follow-up type: In-patient    Dahlia Byes [facility] 08/08/2019, 1:43 PM

## 2019-08-08 NOTE — Progress Notes (Addendum)
Subjective: Postpartum Day 1 s/p Primary Cesarean Delivery 2/2 fetal macrosomia. Patient reports doing well and rates the pain a 6 out of 10. She denies N,V,CP,SOB and calf pain. She has been up to void without difficulty. Tolerating PO. Has not passed gas or bowel movement. She is breast feeding without difficulty. She got a ppIUD after her c-section. She would like her newborn to be circumcised.   Objective: Vital signs in last 24 hours: Temp:  [98.1 F (36.7 C)-99 F (37.2 C)] 98.7 F (37.1 C) (07/24 0545) Pulse Rate:  [83-100] 86 (07/24 0545) Resp:  [15-20] 18 (07/24 0545) BP: (106-137)/(43-86) 119/51 (07/24 0545) SpO2:  [95 %-100 %] 100 % (07/24 0545) Weight:  [153.8 kg] 153.8 kg (07/23 1243)  Physical Exam:  General: alert, cooperative, appears stated age and no distress Lochia: appropriate Uterine Fundus: firm Incision: healing well, no significant drainage, no dehiscence, no significant erythema DVT Evaluation: No evidence of DVT seen on physical exam. Negative Homan's sign. No cords or calf tenderness. No significant calf/ankle edema.  Recent Labs    08/05/19 0906 08/08/19 0430  HGB 11.2* 9.1*  HCT 34.4* 28.0*    Assessment/Plan: Status post Cesarean section. Doing well postoperatively.  Continue current care. Newborn needs circumcision before discharge. Likely discharge tomorrow.  Phill Myron 08/08/2019, 7:37 AM   I saw and evaluated the patient. I agree with the findings and the plan of care as documented in the resident's note. PO iron initiated. Hx of cHTN not on medications; BP's normal range currently.   Jerilynn Birkenhead, MD [employer] Family Medicine Fellow, [facility] for [employer], [facility]

## 2019-08-08 NOTE — Progress Notes (Signed)
Dr. Morene Antu states to discontinue the 200 mg of labetalol.

## 2019-08-09 ENCOUNTER — Other Ambulatory Visit: Payer: Self-pay | Admitting: Student

## 2019-08-09 ENCOUNTER — Telehealth: Payer: Self-pay | Admitting: Student

## 2019-08-09 MED ORDER — OXYCODONE-ACETAMINOPHEN 5-325 MG PO TABS: 1.0000 | ORAL_TABLET | ORAL | 0 refills | Status: DC | PRN

## 2019-08-09 MED ORDER — ACETAMINOPHEN 500 MG PO TABS: 1000.0000 mg | ORAL_TABLET | Freq: Four times a day (QID) | ORAL | 0 refills | Status: DC

## 2019-08-09 MED ORDER — FERROUS SULFATE 325 (65 FE) MG PO TABS: 325.0000 mg | ORAL_TABLET | Freq: Every day | ORAL | 3 refills | Status: DC

## 2019-08-09 MED ORDER — IBUPROFEN 800 MG PO TABS: 800.0000 mg | ORAL_TABLET | Freq: Four times a day (QID) | ORAL | 0 refills | Status: DC | PRN

## 2019-08-09 NOTE — Discharge Instructions (Signed)

## 2019-08-09 NOTE — Telephone Encounter (Signed)
Called patient and left voice mail stating that pain medicine has been sent to the pharmacy on file.   Sara Carpenter

## 2019-08-10 ENCOUNTER — Other Ambulatory Visit: Payer: Medicaid Other

## 2019-08-10 ENCOUNTER — Encounter: Payer: Medicaid Other | Admitting: Obstetrics & Gynecology

## 2019-08-13 ENCOUNTER — Other Ambulatory Visit: Payer: Medicaid Other

## 2019-08-17 ENCOUNTER — Ambulatory Visit (INDEPENDENT_AMBULATORY_CARE_PROVIDER_SITE_OTHER): Payer: Medicaid Other | Admitting: Obstetrics & Gynecology

## 2019-08-17 ENCOUNTER — Encounter: Payer: Self-pay | Admitting: Obstetrics & Gynecology

## 2019-08-17 VITALS — BP 141/95 | HR 106 | Ht 66.0 in | Wt 322.0 lb

## 2019-08-17 DIAGNOSIS — Z98891 History of uterine scar from previous surgery: Secondary | ICD-10-CM

## 2019-08-17 MED ORDER — AMLODIPINE BESYLATE 10 MG PO TABS: 10.0000 mg | ORAL_TABLET | Freq: Every day | ORAL | 1 refills | Status: DC

## 2019-08-17 NOTE — Progress Notes (Signed)
  HPI: Patient returns for routine postoperative follow-up having undergone primary C section on 7/25.  The patient's immediate postoperative recovery has been unremarkable. Since hospital discharge the patient reports headache for 3 days.   Current Outpatient Medications: acetaminophen (TYLENOL) 500 MG tablet, Take 2 tablets (1,000 mg total) by mouth every 6 (six) hours., Disp: 30 tablet, Rfl: 0 Blood Pressure Monitor MISC, For regular home bp monitoring during pregnancy, Disp: 1 each, Rfl: 0 ferrous sulfate 325 (65 FE) MG tablet, Take 1 tablet (325 mg total) by mouth daily with breakfast., Disp: 30 tablet, Rfl: 3 ibuprofen (ADVIL) 800 MG tablet, Take 1 tablet (800 mg total) by mouth every 6 (six) hours as needed for moderate pain., Disp: 30 tablet, Rfl: 0 oxyCODONE-acetaminophen (PERCOCET/ROXICET) 5-325 MG tablet, Take 1 tablet by mouth every 4 (four) hours as needed for severe pain., Disp: 20 tablet, Rfl: 0 pantoprazole (PROTONIX) 40 MG tablet, Take 1 tablet (40 mg total) by mouth daily., Disp: 30 tablet, Rfl: 1  No current facility-administered medications for this visit.    Blood pressure (!) 141/95, pulse (!) 106, height 5\' 6"  (1.676 m), weight (!) 322 lb (146.1 kg), not currently breastfeeding.  Physical Exam: Incision clean dry intact Abdomen soft normal post op  Diagnostic Tests:   Pathology: benign  Impression: Pp hypertension  Plan: Meds ordered this encounter  Medications  . amLODipine (NORVASC) 10 MG tablet    Sig: Take 1 tablet (10 mg total) by mouth daily.    Dispense:  30 tablet    Refill:  1     Follow up: 2  weeks  , MD

## 2019-08-31 ENCOUNTER — Ambulatory Visit (INDEPENDENT_AMBULATORY_CARE_PROVIDER_SITE_OTHER): Payer: Medicaid Other | Admitting: *Deleted

## 2019-08-31 ENCOUNTER — Other Ambulatory Visit: Payer: Self-pay

## 2019-08-31 VITALS — BP 132/86 | HR 89 | Wt 320.0 lb

## 2019-08-31 DIAGNOSIS — Z0130 Encounter for examination of blood pressure without abnormal findings: Secondary | ICD-10-CM

## 2019-08-31 NOTE — Progress Notes (Addendum)
   NURSE VISIT- BLOOD PRESSURE CHECK  SUBJECTIVE:  Sara Carpenter is a 29 y.o. [phone] female here for BP check. She is postpartum, delivery date 7/23    HYPERTENSION ROS:  Postpartum:  . Severe headaches that don't go away with tylenol/other medicines: No  . Visual changes (seeing spots/double/blurred vision) No  . Severe pain under right breast breast or in center of upper chest No  . Severe nausea/vomiting No  . Taking medicines as instructed yes  OBJECTIVE:  BP 132/86 (BP Location: Left Arm, Patient Position: Sitting, Cuff Size: Normal)   Pulse 89   Wt (!) 320 lb (145.2 kg)   Breastfeeding No   BMI 51.65 kg/m   Appearance alert, well appearing, and in no distress and oriented to person, place, and time.  ASSESSMENT: Postpartum  blood pressure check  PLAN: Discussed with Sara Carpenter, CNM, [facility]   Recommendations: stop medicine 2 days before next visit   Follow-up: as scheduled   Sara Carpenter  08/31/2019 12:31 PM   Chart reviewed for nurse visit. Agree with plan of care.  Sara Carpenter, [place] 08/31/2019 12:32 PM

## 2019-09-14 ENCOUNTER — Ambulatory Visit: Payer: Medicaid Other | Admitting: Women's Health

## 2019-09-16 ENCOUNTER — Ambulatory Visit: Payer: Medicaid Other | Admitting: Advanced Practice Midwife

## 2020-05-23 IMAGING — US US MFM OB LIMITED
1 series · 13 of 13 positions shown · non-contrast
Comparison: none

[Series 1: us mfm ob limited · 13 of 13 slices shown]
[im 1/13]
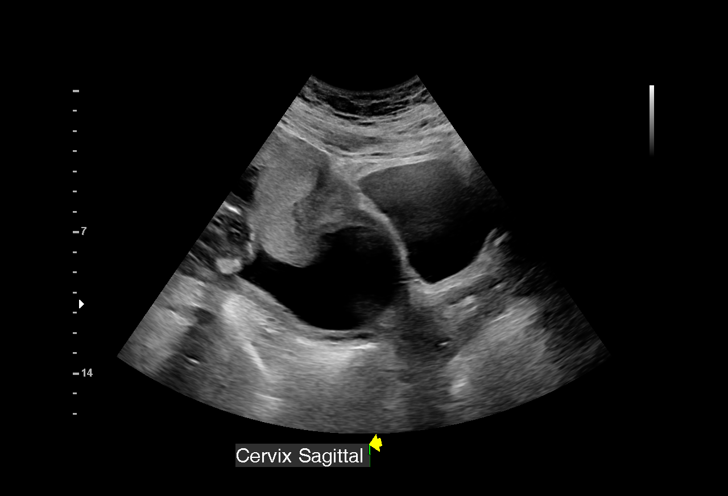
[im 2/13]
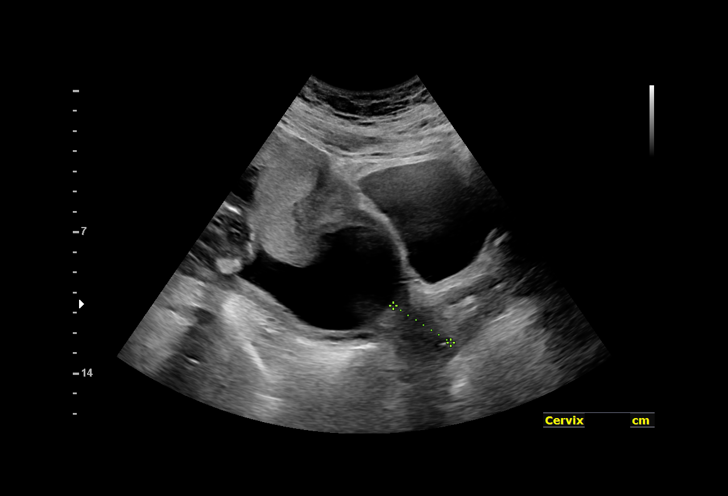
[im 3/13]
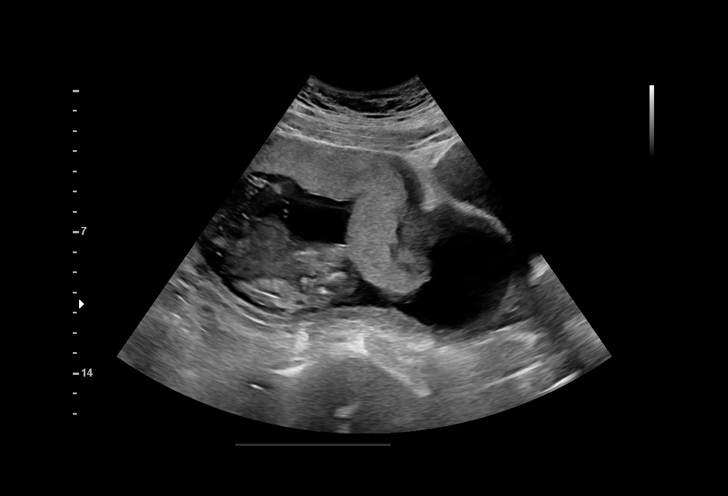
[im 4/13]
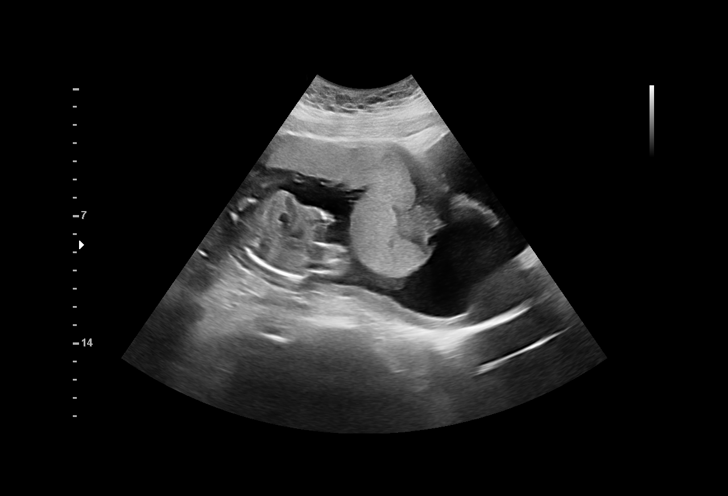
[im 5/13]
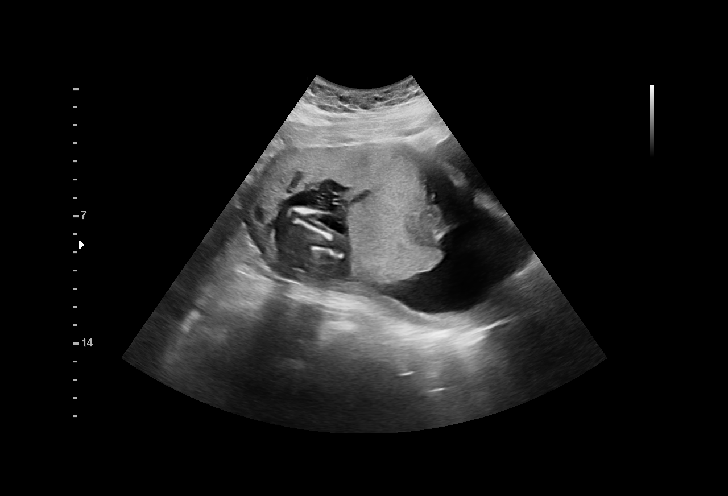
[im 6/13]
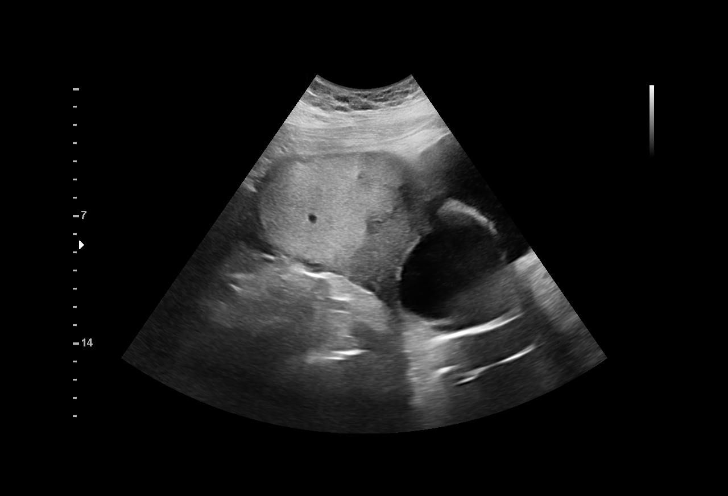
[im 7/13]
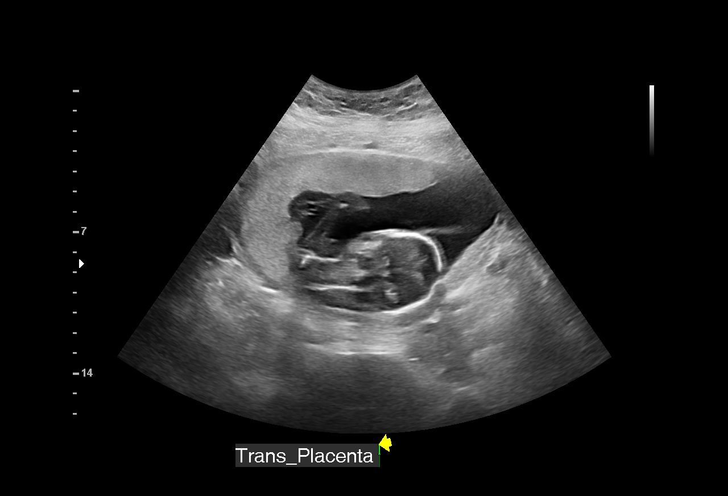
[im 8/13]
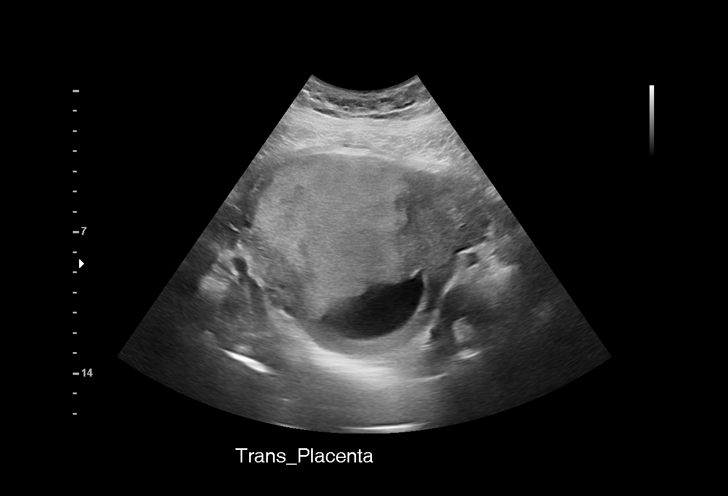
[im 9/13]
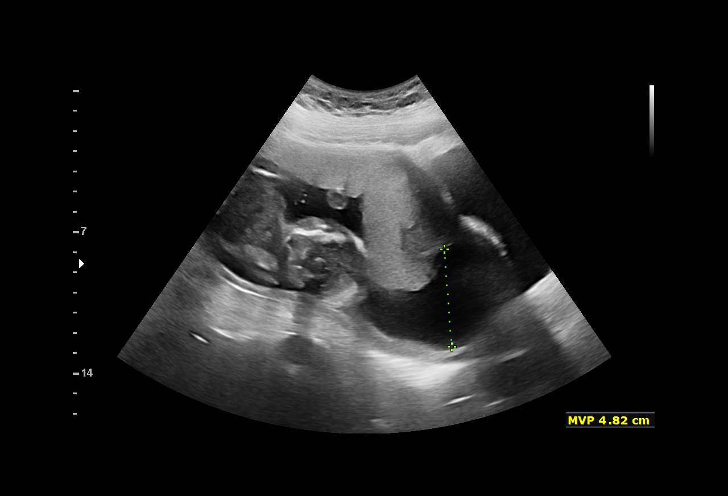
[im 10/13]
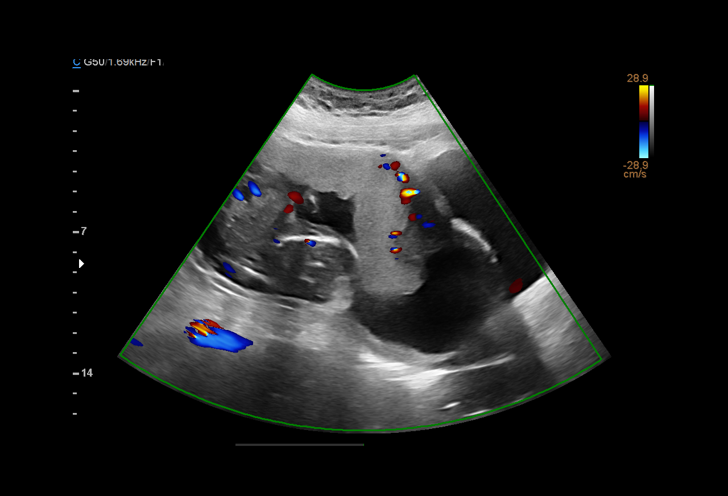
[im 11/13]
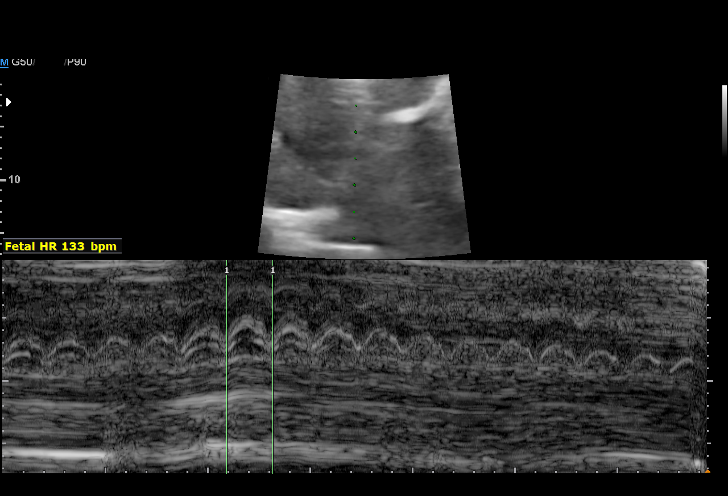
[im 12/13]
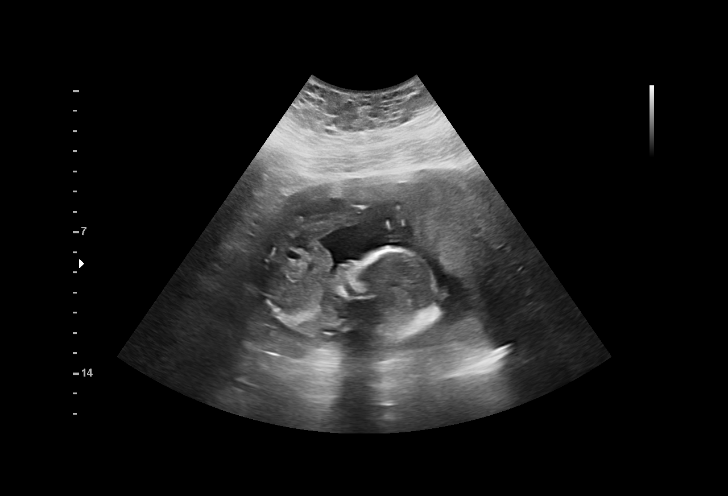
[im 13/13]
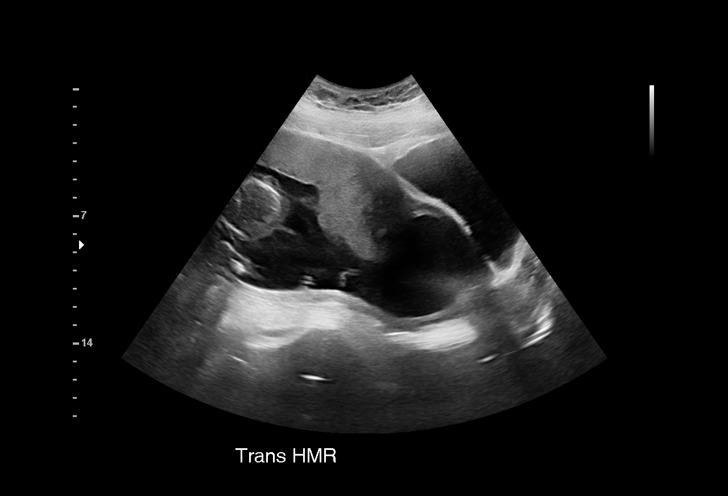

[13 of 13 positions shown; findings below may reference images not displayed]

CNM

  1  US MFM OB LIMITED                    76815.01     GOUAAN AMATO MAGRIN
 ----------------------------------------------------------------------

 ----------------------------------------------------------------------
Indications

  Abdominal pain in pregnancy ("cramps in
  uterus")
  19 weeks gestation of pregnancy
  Maternal morbid obesity
 ----------------------------------------------------------------------
Vital Signs

                                                Height:        5'7"
Fetal Evaluation

 Num Of Fetuses:          1
 Fetal Heart Rate(bpm):   133
 Cardiac Activity:        Observed
 Presentation:            Transverse, head to maternal right
 Placenta:                No abruption or previa seen, Anterior

 Amniotic Fluid
 AFI FV:      Within normal limits

                             Largest Pocket(cm)

OB History

 Gravidity:    4         Term:   1         SAB:   2
 Living:       1
Gestational Age

 LMP:           19w 3d        Date:  10/21/18                 EDD:   07/28/19
 Best:          19w 3d     Det. By:  LMP  (10/21/18)          EDD:   07/28/19
Cervix Uterus Adnexa

 Cervix
 Length:           3.38  cm.
 Normal appearance by transabdominal scan.
Impression

 Limited exam
 Maternal complaints of abdominal pain
 Good fetal movement and amniotic fluid volume
 no evidence of placental abruption or previa.
Recommendations

 Follow up anatomy at first available appointment if prior
 anatomy exam not performed.

## 2020-07-16 ENCOUNTER — Other Ambulatory Visit: Payer: Self-pay

## 2020-07-16 ENCOUNTER — Emergency Department (HOSPITAL_COMMUNITY)
Admission: EM | Admit: 2020-07-16 | Discharge: 2020-07-16 | Disposition: A | Discharge status: Home / Self Care | Payer: Medicaid Other | Attending: Emergency Medicine | Admitting: Emergency Medicine

## 2020-07-16 ENCOUNTER — Emergency Department (HOSPITAL_COMMUNITY): Payer: Medicaid Other

## 2020-07-16 ENCOUNTER — Encounter (HOSPITAL_COMMUNITY): Payer: Self-pay | Admitting: *Deleted

## 2020-07-16 DIAGNOSIS — Z87891 Personal history of nicotine dependence: Secondary | ICD-10-CM | POA: Diagnosis not present

## 2020-07-16 DIAGNOSIS — U071 COVID-19: Secondary | ICD-10-CM

## 2020-07-16 DIAGNOSIS — M545 Low back pain, unspecified: Secondary | ICD-10-CM | POA: Diagnosis present

## 2020-07-16 LAB — CBC WITH DIFFERENTIAL/PLATELET
Abs Immature Granulocytes: 0.07 10*3/uL (ref 0.00–0.07)
Basophils Absolute: 0.1 10*3/uL (ref 0.0–0.1)
Basophils Relative: 1 %
Eosinophils Absolute: 0.3 10*3/uL (ref 0.0–0.5)
Eosinophils Relative: 3 %
HCT: 40.2 % (ref 36.0–46.0)
Hemoglobin: 13.8 g/dL (ref 12.0–15.0)
Immature Granulocytes: 1 %
Lymphocytes Relative: 5 %
Lymphs Abs: 0.5 10*3/uL — ABNORMAL LOW (ref 0.7–4.0)
MCH: 30.1 pg (ref 26.0–34.0)
MCHC: 34.3 g/dL (ref 30.0–36.0)
MCV: 87.8 fL (ref 80.0–100.0)
Monocytes Absolute: 1.1 10*3/uL — ABNORMAL HIGH (ref 0.1–1.0)
Monocytes Relative: 10 %
Neutro Abs: 8.8 10*3/uL — ABNORMAL HIGH (ref 1.7–7.7)
Neutrophils Relative %: 80 %
Platelets: 332 10*3/uL (ref 150–400)
RBC: 4.58 MIL/uL (ref 3.87–5.11)
RDW: 13.6 % (ref 11.5–15.5)
WBC: 10.8 10*3/uL — ABNORMAL HIGH (ref 4.0–10.5)
nRBC: 0 % (ref 0.0–0.2)

## 2020-07-16 LAB — COMPREHENSIVE METABOLIC PANEL
ALT: 22 U/L (ref 0–44)
AST: 19 U/L (ref 15–41)
Albumin: 4.2 g/dL (ref 3.5–5.0)
Alkaline Phosphatase: 55 U/L (ref 38–126)
Anion gap: 8 (ref 5–15)
BUN: 9 mg/dL (ref 6–20)
CO2: 23 mmol/L (ref 22–32)
Calcium: 9.1 mg/dL (ref 8.9–10.3)
Chloride: 104 mmol/L (ref 98–111)
Creatinine, Ser: 0.79 mg/dL (ref 0.44–1.00)
GFR, Estimated: >60 mL/min (ref >60)
Glucose, Bld: 99 mg/dL (ref 70–99)
Potassium: 3.4 mmol/L — ABNORMAL LOW (ref 3.5–5.1)
Sodium: 135 mmol/L (ref 135–145)
Total Bilirubin: 0.4 mg/dL (ref 0.3–1.2)
Total Protein: 7.4 g/dL (ref 6.5–8.1)

## 2020-07-16 LAB — POC URINE PREG, ED: Preg Test, Ur: NEGATIVE

## 2020-07-16 LAB — URINALYSIS, ROUTINE W REFLEX MICROSCOPIC
Bilirubin Urine: NEGATIVE
Glucose, UA: NEGATIVE mg/dL
Hgb urine dipstick: NEGATIVE
Ketones, ur: 20 mg/dL — AB
Leukocytes,Ua: NEGATIVE
Nitrite: NEGATIVE
Protein, ur: NEGATIVE mg/dL
Specific Gravity, Urine: 1.021 (ref 1.005–1.030)
pH: 7 (ref 5.0–8.0)

## 2020-07-16 LAB — RESP PANEL BY RT-PCR (FLU A&B, COVID) ARPGX2
Influenza A by PCR: NEGATIVE
Influenza B by PCR: NEGATIVE
SARS Coronavirus 2 by RT PCR: POSITIVE — AB

## 2020-07-16 MED ORDER — NIRMATRELVIR/RITONAVIR (PAXLOVID)TABLET: 3.0000 | ORAL_TABLET | Freq: Two times a day (BID) | ORAL | 0 refills | Status: AC

## 2020-07-16 MED ORDER — ONDANSETRON HCL 4 MG/2ML IJ SOLN
4.0000 mg | Freq: Once | INTRAMUSCULAR | Status: AC
  Administered 2020-07-16: 4 mg via INTRAVENOUS
  Filled 2020-07-16: qty 2

## 2020-07-16 MED ORDER — PREDNISONE 10 MG PO TABS: 30.0000 mg | ORAL_TABLET | Freq: Every day | ORAL | 0 refills | Status: AC

## 2020-07-16 MED ORDER — HYDROMORPHONE HCL 1 MG/ML IJ SOLN
0.5000 mg | Freq: Once | INTRAMUSCULAR | Status: AC
  Administered 2020-07-16: 0.5 mg via INTRAVENOUS
  Filled 2020-07-16: qty 1

## 2020-07-16 MED ORDER — KETOROLAC TROMETHAMINE 30 MG/ML IJ SOLN
30.0000 mg | Freq: Once | INTRAMUSCULAR | Status: AC
  Administered 2020-07-16: 30 mg via INTRAVENOUS
  Filled 2020-07-16: qty 1

## 2020-07-16 MED ORDER — OXYCODONE-ACETAMINOPHEN 5-325 MG PO TABS: 1.0000 | ORAL_TABLET | Freq: Three times a day (TID) | ORAL | 0 refills | Status: AC | PRN

## 2020-07-16 MED ORDER — ALBUTEROL SULFATE HFA 108 (90 BASE) MCG/ACT IN AERS
2.0000 | INHALATION_SPRAY | Freq: Once | RESPIRATORY_TRACT | Status: AC
  Administered 2020-07-16: 2 via RESPIRATORY_TRACT
  Filled 2020-07-16: qty 6.7

## 2020-07-16 MED ORDER — ACETAMINOPHEN 325 MG PO TABS
650.0000 mg | ORAL_TABLET | Freq: Once | ORAL | Status: AC
  Administered 2020-07-16: 650 mg via ORAL
  Filled 2020-07-16: qty 2

## 2020-07-16 NOTE — ED Triage Notes (Signed)
C/o back pain states she is unable to walk, able to get on stretcher without assistance

## 2020-07-16 NOTE — ED Notes (Signed)
Pt. Ambulated with no assistance at the bedside. Pt. Said her legs felt weak. Vitals maintained at an spo2 of 98% and pulse of 105 bpm.

## 2020-07-16 NOTE — ED Provider Notes (Signed)
Patient was received at shift change from [facility], she provided HPI, current work-up, likely disposition.  Please see her note for full detail.  In short patient with lower back pain x40-month presents with worsening back pain states she has pain radiating down both her legs, with intermittent paresthesias in her feet's.  Patient is weightbearing, able to ambulate.  Work-up reveals slight leukocytosis of 10.8, CMP shows hypokalemia 3.4, UA negative for nitrites, slight hematuria, urine pregnancy negative, COVID-positive.  Lumbar x-ray negative for acute findings.  Per previous provider follow-up on imaging if negative patient can be discharged home with pain medications and discuss antivirals as she is obese, nonvaccinated.  I suspect patient suffering from muscular strain of her lower back, will provide her with a short course of narcotics, steroids follow-up with PCP as needed.  will also provide patient with antivirals for COVID infection as she is increased risk for adverse outcome.  Vital signs have remained stable, no indication for hospital admission.  Patient discussed with attending and they agreed with assessment and plan.  Patient given at home care as well strict return precautions.  Patient verbalized that they understood agreed to said plan.    Barnie Del 07/16/20 2037    Cheryll Cockayne, MD 07/22/20 [phone]

## 2020-07-16 NOTE — ED Provider Notes (Signed)
[facility] EMERGENCY DEPARTMENT Provider Note   CSN: [id number] Arrival date & time: 07/16/20  1454     History Chief Complaint  Patient presents with   Back Pain    Kyla BalzarineJuanita R Grassel is a 30 y.o. female.   Back Pain Associated symptoms: no abdominal pain, no chest pain, no dysuria, no fever, no headaches, no numbness and no weakness        Kyla BalzarineJuanita R Lembke is a 30 y.o. female who presents to the Emergency Department complaining of gradually worsening lower back pain x11 months.  She began noticing pain of her lower back after receiving a epidural injection during her childbirth.  She developed generalized body aches, dizziness, nausea and vomiting with diarrhea this morning upon waking.  She went to take a nap this afternoon and woke up with severe pain to her lower back.  She describes pain as sharp and radiating into both hips and down both legs.  She has pins and needles sensation of both her feet.  Pain is worse with movement and with weightbearing.  She denies fever, chills, dysuria, abdominal pain, chest pain or shortness of breath.  No recent illness or recent spinal procedures. No known covid exposures, she is not vaccinated.     Past Medical History:  Diagnosis Date   Anxiety    Bipolar 1 disorder (HCC)    Depression    is fine   Headache    Infected tooth    Infection    UTI   Obesity    PCOS (polycystic ovarian syndrome)    "When I was young". No medical intervention.   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   Bipolar disorder (HCC) 02/27/2016   History of PCOS  02/27/2016   Morbid obesity (HCC)    Sebaceous cyst 12/10/2012    Past Surgical History:  Procedure Laterality Date   CESAREAN SECTION N/A 08/07/2019   Procedure: CESAREAN SECTION;  Surgeon: Tilda BurrowFerguson, John V, MD;  Location: [facility] LD ORS;  Service: Obstetrics;  Laterality: N/A;   DILATION AND CURETTAGE OF UTERUS N/A 09/03/2012   Procedure: SUCTION DILATATION AND CURETTAGE;  Surgeon: Lazaro ArmsLuther H Eure, MD;  Location: [facility] ORS;  Service: Gynecology;  Laterality: N/A;   Migraine headache     TONSILLECTOMY       OB History     Gravida  4   Para  2   Term  2   Preterm      AB  2   Living  2      SAB  2   IAB      Ectopic      Multiple  0   Live Births  2           Family History  Problem Relation Age of Onset   Diabetes Paternal Grandmother    Diabetes Paternal Grandfather    Cancer Mother        cervical   Cancer Paternal Uncle        lung   Cancer Maternal Grandmother        lung  Cancer Maternal Grandfather        throat   Healthy Father    Kidney disease Sister     Social History   Tobacco Use   Smoking status: Former    Packs/day: 0.00    Years: 2.00    Pack years: 0.00    Types: Cigarettes    Quit date: 08/25/2012    Years since quitting: 7.8   Smokeless tobacco: Never  Vaping Use   Vaping Use: Never used  Substance Use Topics   Alcohol use: Not Currently    Comment: occas   Drug use: Yes    Types: Marijuana    Home Medications Prior to Admission medications   Medication Sig Start Date End Date Taking? Authorizing Provider  acetaminophen (TYLENOL) 500 MG tablet Take 2 tablets (1,000 mg total) by mouth every 6 (six) hours. Patient not taking: Reported on 08/31/2019 08/09/19   Phill Myron, MD  amLODipine (NORVASC) 10 MG tablet Take 1 tablet (10 mg total) by mouth daily. 08/17/19   Lazaro Arms, MD  Blood Pressure Monitor MISC For regular home bp monitoring during pregnancy 05/04/19   Cheral Marker, CNM  ferrous sulfate 325 (65 FE) MG  tablet Take 1 tablet (325 mg total) by mouth daily with breakfast. 08/09/19   Phill Myron, MD  ibuprofen (ADVIL) 800 MG tablet Take 1 tablet (800 mg total) by mouth every 6 (six) hours as needed for moderate pain. Patient not taking: Reported on 08/31/2019 08/09/19   Phill Myron, MD  oxyCODONE-acetaminophen (PERCOCET/ROXICET) 5-325 MG tablet Take 1 tablet by mouth every 4 (four) hours as needed for severe pain. Patient not taking: Reported on 08/31/2019 08/09/19   Marylene Land, CNM  pantoprazole (PROTONIX) 40 MG tablet Take 1 tablet (40 mg total) by mouth daily. 07/27/19   Lazaro Arms, MD    Allergies    Carbamazepine  Review of Systems   Review of Systems  Constitutional:  Negative for fever.  Respiratory:  Negative for shortness of breath.   Cardiovascular:  Negative for chest pain.  Gastrointestinal:  Positive for diarrhea, nausea and vomiting. Negative for abdominal pain and constipation.  Genitourinary:  Negative for decreased urine volume, difficulty urinating, dysuria, flank pain and hematuria.  Musculoskeletal:  Positive for back pain and myalgias. Negative for joint swelling, neck pain and neck stiffness.  Skin:  Negative for rash.  Neurological:  Positive for dizziness. Negative for syncope, speech difficulty, weakness, numbness and headaches.   Physical Exam Updated Vital Signs BP (!) 139/128   Pulse (!) 127   Temp 98.7 F (37.1 C) (Oral)   Resp (!) 24   Ht 5\' 6"  (1.676 m)   Wt (!) 145.2 kg   SpO2 98%   BMI 51.65 kg/m   Physical Exam Vitals and nursing note reviewed.  Constitutional:      General: She is not in acute distress.    Appearance: Normal appearance. She is obese. She is not toxic-appearing.  HENT:     Mouth/Throat:     Mouth: Mucous membranes are moist.  Eyes:     Conjunctiva/sclera: Conjunctivae normal.     Pupils: Pupils are equal, round, and reactive to light.  Cardiovascular:     Rate and Rhythm: Regular rhythm. Tachycardia  present.     Pulses: Normal pulses.  Pulmonary:     Effort: Pulmonary effort is normal.     Breath sounds: Normal breath sounds.  Chest:     Chest wall: No tenderness.  Abdominal:     General: There is no distension.     Palpations: Abdomen is soft.     Tenderness: There is no abdominal tenderness. There is no right CVA tenderness or left CVA tenderness.  Musculoskeletal:        General: Tenderness present. No signs of injury.     Cervical back: Normal range of motion. No tenderness.     Right lower leg: No edema.     Left lower leg: No edema.     Comments: Patient has tenderness to palpation of the lower lumbar spine and bilateral paraspinal muscles.  Hip flexors and extensors are intact.  5 out of 5 strength of the bilateral lower extremities.  Skin:    General: Skin is warm.     Capillary Refill: Capillary refill takes less than 2 seconds.     Findings: No erythema or rash.  Neurological:     General: No focal deficit present.     Mental Status: She is alert.     Sensory: No sensory deficit.     Motor: No weakness.    ED Results / Procedures / Treatments   Labs (all labs ordered are listed, but only abnormal results are displayed) Labs Reviewed  RESP PANEL BY RT-PCR (FLU A&B, COVID) ARPGX2 - Abnormal; Notable for the following components:      Result Value   SARS Coronavirus 2 by RT PCR POSITIVE (*)    All other components within normal limits  COMPREHENSIVE METABOLIC PANEL - Abnormal; Notable for the following components:   Potassium 3.4 (*)    All other components within normal limits  CBC WITH DIFFERENTIAL/PLATELET - Abnormal; Notable for the following components:   WBC 10.8 (*)    Neutro Abs 8.8 (*)    Lymphs Abs 0.5 (*)    Monocytes Absolute 1.1 (*)    All other components within normal limits  URINALYSIS, ROUTINE W REFLEX MICROSCOPIC - Abnormal; Notable for the following components:   Ketones, ur 20 (*)    All other components within normal limits  POC URINE  PREG, ED    EKG None  Radiology No results found.  Procedures Procedures   Medications Ordered in ED Medications  ketorolac (TORADOL) 30 MG/ML injection 30 mg (has no administration in time range)  HYDROmorphone (DILAUDID) injection 0.5 mg (0.5 mg Intravenous Given 07/16/20 1655)  ondansetron (ZOFRAN) injection 4 mg (4 mg Intravenous Given 07/16/20 1710)  acetaminophen (TYLENOL) tablet 650 mg (650 mg Oral Given 07/16/20 1840)    ED Course  I have reviewed the triage vital signs and the nursing notes.  Pertinent labs & imaging results that were available during my care of the patient were reviewed by me and considered in my medical decision making (see chart for details).    MDM Rules/Calculators/A&P                          Patient here with complaints of generalized body aches, nausea, vomiting, diarrhea.  Symptoms began this morning.  Reports gradually worsening lower back pain for 11 months and began after receiving an epidural for childbirth.  States she always has back pain but pain worse this afternoon.  Desribes a sharp pain to her lower back that radiates into both hips and down her legs.  She reports some pins and needle sensation to both feet.  She ambulated to the restroom with a steady gait.  She has 5 out of 5 strength of  bilateral lower extremities.  No red flags to suggest spinal abscess, cauda equina or emergent process.  Patient is nontoxic-appearing.  Mildly tachycardic but has low-grade temp.  Pain addressed with IV Dilaudid and Toradol.  She was given oral acetaminophen for frontal headache.  Chemistries without significant changes.  Mild leukocytosis.  No anemia.  Urinalysis without evidence of infection.  Patient is not currently pregnant.  COVID test is positive.  She is unvaccinated.  Patient getting plain film of L-spine.  If film without acute findings patient will likely be discharged home.  She will be given albuterol inhaler given that she has history of  asthma.  She will also be provided with a short course of pain medication and discuss treatment with Paxlovid.    Discussed findings with Berle Mull, PA-C who agrees to review images and arrange disposition.   Final Clinical Impression(s) / ED Diagnoses Final diagnoses:  None    Rx / DC Orders ED Discharge Orders     None        Rosey Bath 07/16/20 1935    Cheryll Cockayne, MD 07/22/20 [phone]

## 2020-07-16 NOTE — Discharge Instructions (Addendum)
You have been seen here for back pain.  Given you prescription for narcotics please use as needed for pain.  This medication can make you drowsy do not consume alcohol or operate heavy machinery when taking this medication.  This medication has Tylenol in it, do not take Tylenol when taking this medication. Started you  steroids please take as prescribed.  I recommend taking over-the-counter pain medications like ibuprofen every 6 as needed.  Please follow dosage and on the back of bottle.  I also recommend applying heat to the area and stretching out the muscles as this will help decrease stiffness and pain.  I have given you information on exercises please follow.  you are Covid positive you must self quarantine for 5 days starting on symptom onset, if at the end of those 5 days you are feeling better you may return back to school/work, if you continue to have symptoms you must self quarantine for additional 5 days.  I would like you to contact "post Covid care" as they will provide you with information how to manage your Covid symptoms   Come back to the emergency department if you develop chest pain, shortness of breath, severe abdominal pain, uncontrolled nausea, vomiting, diarrhea.     Come back to the emergency department if you develop chest pain, shortness of breath, severe abdominal pain, uncontrolled nausea, vomiting, diarrhea.

## 2020-07-16 NOTE — ED Notes (Signed)
Pt ambulated 

## 2020-10-03 ENCOUNTER — Ambulatory Visit
Admission: EM | Admit: 2020-10-03 | Discharge: 2020-10-03 | Disposition: A | Discharge status: Home / Self Care | Payer: Medicaid Other | Attending: Family Medicine | Admitting: Family Medicine

## 2020-10-03 DIAGNOSIS — J4541 Moderate persistent asthma with (acute) exacerbation: Secondary | ICD-10-CM

## 2020-10-03 DIAGNOSIS — J22 Unspecified acute lower respiratory infection: Secondary | ICD-10-CM

## 2020-10-03 DIAGNOSIS — R0602 Shortness of breath: Secondary | ICD-10-CM

## 2020-10-03 DIAGNOSIS — R062 Wheezing: Secondary | ICD-10-CM | POA: Diagnosis not present

## 2020-10-03 MED ORDER — PREDNISONE 20 MG PO TABS: 40.0000 mg | ORAL_TABLET | Freq: Every day | ORAL | 0 refills | Status: DC

## 2020-10-03 MED ORDER — PROMETHAZINE-DM 6.25-15 MG/5ML PO SYRP: 5.0000 mL | ORAL_SOLUTION | Freq: Four times a day (QID) | ORAL | 0 refills | Status: DC | PRN

## 2020-10-03 MED ORDER — ALBUTEROL SULFATE HFA 108 (90 BASE) MCG/ACT IN AERS: 1.0000 | INHALATION_SPRAY | Freq: Four times a day (QID) | RESPIRATORY_TRACT | 0 refills | Status: DC | PRN

## 2020-10-03 MED ORDER — AMOXICILLIN-POT CLAVULANATE 875-125 MG PO TABS: 1.0000 | ORAL_TABLET | Freq: Two times a day (BID) | ORAL | 0 refills | Status: DC

## 2020-10-03 NOTE — ED Provider Notes (Signed)
[facility]    CSN: [id number] Arrival date & time: 10/03/20  1336      History   Chief Complaint No chief complaint on file.   HPI Sara Carpenter is a 30 y.o. female.   Presenting today with about a week of cough, congestion that worsened a day or so ago and she now feels short of breath with chest tightness, wheezing that is worsening.  She states her inhaler has been helping up until today when it stopped helping so much.  She denies fever, chills, palpitations, chest pain, dizziness, syncope.  Has been using albuterol inhaler and allergy medication with minimal relief.  History of asthma, allergies.  Son sick with similar symptoms.   Past Medical History:  Diagnosis Date   Anxiety    Bipolar 1 disorder (HCC)    Depression    is fine   Headache    Infected tooth    Infection    UTI   Obesity    PCOS (polycystic ovarian syndrome)    "When I was young". No medical intervention.   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   Bipolar disorder (HCC) 02/27/2016   History of PCOS 02/27/2016   Morbid obesity (HCC)    Sebaceous cyst 12/10/2012    Past Surgical History:  Procedure Laterality Date   CESAREAN SECTION N/A 08/07/2019   Procedure: CESAREAN SECTION;  Surgeon: Tilda Burrow, MD;  Location: [facility];  Service: Obstetrics;  Laterality: N/A;   DILATION AND CURETTAGE OF UTERUS N/A 09/03/2012   Procedure: SUCTION DILATATION AND CURETTAGE;  Surgeon: Lazaro Arms, MD;  Location: [facility];  Service: Gynecology;  Laterality: N/A;   Migraine headache     TONSILLECTOMY       OB History     Gravida  4   Para  2   Term  2   Preterm      AB  2   Living  2      SAB  2   IAB      Ectopic      Multiple  0   Live Births  2            Home Medications    Prior to Admission medications   Medication Sig Start Date End Date Taking? Authorizing Provider  albuterol (VENTOLIN HFA) 108 (90 Base) MCG/ACT inhaler Inhale 1-2 puffs into the lungs every 6 (six) hours as needed for wheezing or shortness of breath. 10/03/20  Yes Particia Nearing, PA-C  amoxicillin-clavulanate (AUGMENTIN) 875-125 MG tablet Take 1 tablet by mouth every 12 (twelve) hours. 10/03/20  Yes Particia Nearing, PA-C  predniSONE (DELTASONE) 20 MG tablet Take 2 tablets (40 mg total) by mouth daily with breakfast. 10/03/20  Yes Particia Nearing, PA-C  promethazine-dextromethorphan (PROMETHAZINE-DM) 6.25-15 MG/5ML syrup Take 5 mLs by mouth 4 (four) times daily as needed for cough. 10/03/20  Yes Particia Nearing, PA-C  acetaminophen (TYLENOL) 500 MG tablet Take 2 tablets (1,000 mg total) by mouth every 6 (six) hours. 08/09/19   Phill Myron, MD  amLODipine (  NORVASC) 10 MG tablet Take 1 tablet (10 mg total) by mouth daily. Patient not taking: Reported on 07/16/2020 08/17/19   Lazaro Arms, MD  Blood Pressure Monitor MISC For regular home bp monitoring during pregnancy 05/04/19   Cheral Marker, CNM  ferrous sulfate 325 (65 FE) MG tablet Take 1 tablet (325 mg total) by mouth daily with breakfast. Patient not taking: Reported on 07/16/2020 08/09/19   Phill Myron, MD  ibuprofen (ADVIL) 800 MG tablet Take 1 tablet (800 mg total) by mouth every 6 (six) hours as needed for moderate pain. Patient not taking: No sig reported 08/09/19   Phill Myron, MD  oxyCODONE-acetaminophen (PERCOCET/ROXICET) 5-325 MG tablet Take 1 tablet by mouth every 4 (four) hours as needed for severe pain. Patient not taking: No sig reported 08/09/19   Marylene Land, CNM   pantoprazole (PROTONIX) 40 MG tablet Take 1 tablet (40 mg total) by mouth daily. Patient not taking: Reported on 07/16/2020 07/27/19   Lazaro Arms, MD    Family History Family History  Problem Relation Age of Onset   Diabetes Paternal Grandmother    Diabetes Paternal Grandfather    Cancer Mother        cervical   Cancer Paternal Uncle        lung   Cancer Maternal Grandmother        lung   Cancer Maternal Grandfather        throat   Healthy Father    Kidney disease Sister     Social History Social History   Tobacco Use   Smoking status: Former    Packs/day: 0.00    Years: 2.00    Pack years: 0.00    Types: Cigarettes    Quit date: 08/25/2012    Years since quitting: 8.1   Smokeless tobacco: Never  Vaping Use   Vaping Use: Never used  Substance Use Topics   Alcohol use: Not Currently    Comment: occas   Drug use: Yes    Types: Marijuana     Allergies   Carbamazepine   Review of Systems Review of Systems Per HPI  Physical Exam Triage Vital Signs ED Triage Vitals [10/03/20 1343]  Enc Vitals Group     BP 121/84     Pulse Rate (!) 131     Resp (!) 26     Temp 97.8 F (36.6 C)     Temp src      SpO2 97 %     Weight      Height      Head Circumference      Peak Flow      Pain Score 0     Pain Loc      Pain Edu?      Excl. in GC?    No data found.  Updated Vital Signs BP 121/84   Pulse (!) 101   Temp 97.8 F (36.6 C)   Resp 20   SpO2 96%   Visual Acuity Right Eye Distance:   Left Eye Distance:   Bilateral Distance:    Right Eye Near:   Left Eye Near:    Bilateral Near:     Physical Exam Vitals and nursing note reviewed.  Constitutional:      Appearance: Normal appearance. She is not ill-appearing.  HENT:     Head: Atraumatic.     Right Ear: Tympanic membrane normal.     Left Ear: Tympanic membrane normal.     Nose:  Rhinorrhea present.     Mouth/Throat:     Mouth: Mucous membranes are moist.     Pharynx: Posterior  oropharyngeal erythema present. No oropharyngeal exudate.  Eyes:     Extraocular Movements: Extraocular movements intact.     Conjunctiva/sclera: Conjunctivae normal.  Cardiovascular:     Rate and Rhythm: Normal rate and regular rhythm.     Heart sounds: Normal heart sounds.  Pulmonary:     Effort: Pulmonary effort is normal. No respiratory distress.     Breath sounds: Wheezing and rales present.     Comments: Speaking in full sentences, breathing comfortably on room air at rest. Possible mild rales in the right lower lobe Musculoskeletal:        General: Normal range of motion.     Cervical back: Normal range of motion and neck supple.  Skin:    General: Skin is warm and dry.  Neurological:     Mental Status: She is alert and oriented to person, place, and time.  Psychiatric:        Mood and Affect: Mood normal.        Thought Content: Thought content normal.        Judgment: Judgment normal.     UC Treatments / Results  Labs (all labs ordered are listed, but only abnormal results are displayed) Labs Reviewed - No data to display  EKG   Radiology No results found.  Procedures Procedures (including critical care time)  Medications Ordered in UC Medications - No data to display  Initial Impression / Assessment and Plan / UC Course  I have reviewed the triage vital signs and the nursing notes.  Pertinent labs & imaging results that were available during my care of the patient were reviewed by me and considered in my medical decision making (see chart for details).     Suspect asthma exacerbation be causing her symptoms.  She declines COVID testing today which is reasonable based on the chronicity of her symptoms.  Oxygen saturation has remained stable on room air at 96%, mildly tachycardic still on recheck but likely secondary to multiple uses of her albuterol inhaler and anxiousness.  We will treat with prednisone, Augmentin given worsening course and possible rales  in the right lower lobe,, Phenergan DM, albuterol inhaler for as needed use.  Discussed continued over-the-counter supportive medications and home care.  Return for acutely worsening symptoms.  Final Clinical Impressions(s) / UC Diagnoses   Final diagnoses:  Lower respiratory infection  Wheezing  SOB (shortness of breath)  Moderate persistent asthma with acute exacerbation   Discharge Instructions   None    ED Prescriptions     Medication Sig Dispense Auth. Provider   predniSONE (DELTASONE) 20 MG tablet Take 2 tablets (40 mg total) by mouth daily with breakfast. 10 tablet Particia Nearing, PA-C   amoxicillin-clavulanate (AUGMENTIN) 875-125 MG tablet Take 1 tablet by mouth every 12 (twelve) hours. 14 tablet Particia Nearing, [place]   promethazine-dextromethorphan (PROMETHAZINE-DM) 6.25-15 MG/5ML syrup Take 5 mLs by mouth 4 (four) times daily as needed for cough. 100 mL Particia Nearing, PA-C   albuterol (VENTOLIN HFA) 108 (90 Base) MCG/ACT inhaler Inhale 1-2 puffs into the lungs every 6 (six) hours as needed for wheezing or shortness of breath. 18 g Particia Nearing, [place]      PDMP not reviewed this encounter.   Particia Nearing, [place] 10/03/20 1552

## 2020-10-03 NOTE — ED Triage Notes (Signed)
Pt states she has had cough for past week then developed sob , used inhaler with out relief , pt tachypnea upon assessment

## 2020-11-25 ENCOUNTER — Encounter: Payer: Self-pay | Admitting: Adult Health

## 2020-11-25 ENCOUNTER — Other Ambulatory Visit: Payer: Self-pay

## 2020-11-25 ENCOUNTER — Ambulatory Visit: Payer: Medicaid Other | Admitting: Adult Health

## 2020-11-25 ENCOUNTER — Other Ambulatory Visit (HOSPITAL_COMMUNITY)
Admission: RE | Admit: 2020-11-25 | Discharge: 2020-11-25 | Disposition: A | Discharge status: Home / Self Care | Payer: Medicaid Other | Source: Ambulatory Visit | Attending: Adult Health | Admitting: Adult Health

## 2020-11-25 VITALS — BP 125/89 | HR 87 | Ht 66.0 in | Wt 322.0 lb

## 2020-11-25 DIAGNOSIS — R102 Pelvic and perineal pain: Secondary | ICD-10-CM | POA: Insufficient documentation

## 2020-11-25 DIAGNOSIS — Z113 Encounter for screening for infections with a predominantly sexual mode of transmission: Secondary | ICD-10-CM | POA: Diagnosis not present

## 2020-11-25 DIAGNOSIS — R87612 Low grade squamous intraepithelial lesion on cytologic smear of cervix (LGSIL): Secondary | ICD-10-CM | POA: Diagnosis not present

## 2020-11-25 DIAGNOSIS — N9489 Other specified conditions associated with female genital organs and menstrual cycle: Secondary | ICD-10-CM | POA: Diagnosis not present

## 2020-11-25 DIAGNOSIS — Z3202 Encounter for pregnancy test, result negative: Secondary | ICD-10-CM | POA: Insufficient documentation

## 2020-11-25 DIAGNOSIS — Z975 Presence of (intrauterine) contraceptive device: Secondary | ICD-10-CM

## 2020-11-25 DIAGNOSIS — K5900 Constipation, unspecified: Secondary | ICD-10-CM

## 2020-11-25 LAB — POCT URINE PREGNANCY: Preg Test, Ur: NEGATIVE

## 2020-11-25 NOTE — Addendum Note (Signed)
Addended by: Colen Darling on: 11/25/2020 11:48 AM   Modules accepted: Orders

## 2020-11-25 NOTE — Progress Notes (Signed)
  Subjective:     Patient ID: Sara Carpenter, female   DOB: October 19, 1990, 30 y.o.   MRN: [id number]  HPI Sara Carpenter is a 30 year old white female, single, [id number], in complaining of pelvic pain, cramping and irregular bleeding, and pain with sex x 1 and constipation, has Mirena IUD. It was placed post C-section 08/05/19. Has had headaches recently and advil helps.  Lab Results  Component Value Date   DIAGPAP - Low grade squamous intraepithelial lesion (LSIL) (A) 05/04/2019   HPVHIGH Positive (A) 05/04/2019  Had normal colpo May 2021, needs pap .  Review of Systems Patient denies any hearing loss, fatigue, blurred vision, shortness of breath, chest pain, problems with  urination, or intercourse. No joint pain or Carpenter swings.  See HPI for positives.   Reviewed past medical,surgical, social and family history. Reviewed medications and allergies.  Objective:   Physical Exam BP 125/89 (BP Location: Right Arm, Patient Position: Sitting, Cuff Size: Large)   Pulse 87   Ht 5\' 6"  (1.676 m)   Wt (!) 322 lb (146.1 kg)   LMP 11/18/2020 (Approximate)   Breastfeeding No   BMI 51.97 kg/m  UPT is negative. Skin warm and dry.Pelvic: external genitalia is normal in appearance no lesions, vagina: white discharge without odor,urethra has no lesions or masses noted, cervix:smooth and bulbous,+IUD strings at os, pap with HR HPV genotyping and GC/CHL sent, uterus: normal size, shape and contour, mildly tender, no masses felt, adnexa: no masses or tenderness noted. Bladder is non tender and no masses felt.    Fall risk is low  Upstream - 11/25/20 1102       Pregnancy Intention Screening   Does the patient want to become pregnant in the next year? No    Does the patient's partner want to become pregnant in the next year? No    Would the patient like to discuss contraceptive options today? No      Contraception Wrap Up   Current Method IUD or IUS    End Method IUD or IUS            Examination  chaperoned by 13/11/22 LPN  Assessment:     1. Pregnancy examination or test, negative result   2. Screening examination for STD (sexually transmitted disease) GC/CHL on pap  3. Pelvic pain Sara Carpenter scheduled for 11/17 at 3:30 pm at [facility] to assess uterus and ovaries   4. Uterine cramping  5. IUD (intrauterine device) in place   6. Low grade squamous intraepithelial lesion on cytologic smear of cervix (LGSIL) Pap sent   7. Constipation, unspecified constipation type Try senokot S OTC, 1-2 daily    Plan:     Will talk when [facility] results back    Has appt in January for physical

## 2020-12-01 ENCOUNTER — Other Ambulatory Visit: Payer: Self-pay

## 2020-12-01 ENCOUNTER — Ambulatory Visit (HOSPITAL_COMMUNITY)
Admission: RE | Admit: 2020-12-01 | Discharge: 2020-12-01 | Disposition: A | Discharge status: Home / Self Care | Payer: Medicaid Other | Source: Ambulatory Visit | Attending: Adult Health | Admitting: Adult Health

## 2020-12-01 DIAGNOSIS — Z975 Presence of (intrauterine) contraceptive device: Secondary | ICD-10-CM | POA: Insufficient documentation

## 2020-12-01 DIAGNOSIS — N9489 Other specified conditions associated with female genital organs and menstrual cycle: Secondary | ICD-10-CM | POA: Insufficient documentation

## 2020-12-01 DIAGNOSIS — R102 Pelvic and perineal pain: Secondary | ICD-10-CM | POA: Insufficient documentation

## 2020-12-02 LAB — CYTOLOGY - PAP
Chlamydia: NEGATIVE
Comment: NEGATIVE
Comment: NEGATIVE
Comment: NORMAL
Diagnosis: NEGATIVE
High risk HPV: NEGATIVE
Neisseria Gonorrhea: NEGATIVE

## 2021-01-23 ENCOUNTER — Other Ambulatory Visit: Payer: Medicaid Other | Admitting: Adult Health

## 2021-04-11 ENCOUNTER — Other Ambulatory Visit: Payer: Self-pay

## 2021-04-11 ENCOUNTER — Ambulatory Visit: Payer: BC Managed Care – PPO | Admitting: Women's Health

## 2021-04-11 ENCOUNTER — Encounter: Payer: Self-pay | Admitting: Women's Health

## 2021-04-11 VITALS — BP 153/94 | HR 83 | Ht 66.0 in | Wt 340.0 lb

## 2021-04-11 DIAGNOSIS — R102 Pelvic and perineal pain: Secondary | ICD-10-CM

## 2021-04-11 DIAGNOSIS — L309 Dermatitis, unspecified: Secondary | ICD-10-CM

## 2021-04-11 DIAGNOSIS — Z30432 Encounter for removal of intrauterine contraceptive device: Secondary | ICD-10-CM

## 2021-04-11 DIAGNOSIS — R030 Elevated blood-pressure reading, without diagnosis of hypertension: Secondary | ICD-10-CM

## 2021-04-11 NOTE — Progress Notes (Signed)
? ?  IUD REMOVAL  ?Patient name: Sara Carpenter MRN [id number]  Date of birth: [birth year] ?Subjective Findings:   ?Sara Carpenter is a 31 y.o. [phone] Caucasian female being seen today for removal of a Liletta  IUD. Her IUD was placed 08/05/19 during c/s.  She desires removal because of pelvic pain, bloating. Had pelvic u/s 12/01/20 for same, Lt ovary difficult to visualize, small amt free fluid, otherwise wnl. Signed copy of informed consent in chart.  ?No LMP recorded. (Menstrual status: IUD). ?Last pap11/11/22. 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 Was on labetalol during pregnancy and norvasc pp. BP 125/89 on 11/25/20 visit. Has had a lot of stress lately.  ?Dry patch on bilateral elbows, hasn't tried anything ? ? ?  05/04/2019  ? 11:30 AM  ?Depression screen PHQ 2/9  ?Decreased Interest 0  ?Down, Depressed, Hopeless 0  ?PHQ - 2 Score 0  ?Altered sleeping 0  ?Tired, decreased energy 0  ?Change in appetite 0  ?Feeling bad or failure about yourself  0  ?Trouble concentrating 0  ?Moving slowly or fidgety/restless 0  ?Suicidal thoughts 0  ?PHQ-9 Score 0  ?Difficult doing work/chores Not difficult at all  ? ?  ? ?  05/04/2019  ? 11:30 AM  ?GAD 7 : Generalized Anxiety Score  ?Nervous, Anxious, on Edge 1  ?Control/stop worrying 1  ?Worry too much - different things 1  ?Trouble relaxing 1  ?Restless 1  ?Easily annoyed or irritable 1  ?Afraid - awful might happen 0  ?Total GAD 7 Score 6  ?Anxiety Difficulty Somewhat difficult  ? ? ? ?Pertinent History Reviewed:   ?Reviewed past medical,surgical, social, obstetrical and family history.  ?Reviewed problem list, medications and allergies. ?Objective Findings & Procedure:   ? ?Vitals:  ? 04/11/21 0840 04/11/21 0905  ?BP: (!) 154/92 (!) 153/94  ?Pulse: 87 83  ?Weight: (!) 340 lb (154.2 kg)   ?Height: 5\' 6"  (1.676 m)   ?Body mass  index is 54.88 kg/m?. ? ?No results found for this or any previous visit (from the past 24 hour(s)).  ? ?Elbows: dry white flaky patches bilateral elbows c/w eczema vs psoriasis ?Time out was performed. ? ?A graves speculum was placed in the vagina.  The cervix was visualized, and the strings were visible. They were grasped and the Liletta  IUD was easily removed intact without complications. The patient tolerated the procedure well.  ? ?Chaperone:   ?Assessment & Plan:   ?1) Liletta  IUD removal ?Follow-up prn problems, plans abstinence ? ?2) Pelvic pain, bloating> will see how pain does now that IUD removed, ibuprofen, heating pad/warm baths/showers, f/u 4wks ? ?3) Eczema vs psoriasis on elbows> try hydrocortisone cream BID ? ?4) Elevated bp> no meds in past other than labetalol during 2021 pregnancy and norvasc pp for few weeks. Not currently on meds, has been under a lot of stress lately. Doesn't have PCP. Will recheck at visit in 4wks ? ?No orders of the defined types were placed in this encounter. ? ? ?Follow-up: Return in about 4 weeks (around 05/09/2021) for gyn visit w/ CNM/NP. ? ?05/11/2021 CNM, WHNP-BC ?04/11/2021 ?9:06 AM  ?

## 2021-05-09 ENCOUNTER — Ambulatory Visit: Payer: Medicaid Other | Admitting: Women's Health

## 2021-05-23 ENCOUNTER — Ambulatory Visit: Payer: Medicaid Other | Admitting: Women's Health

## 2022-02-18 IMAGING — US US PELVIS COMPLETE WITH TRANSVAGINAL
1 series · 13 of 25 positions shown · non-contrast
Comparison: None.
COMPARISON: None.

Addendum:
CLINICAL DATA: Pelvic pain

EXAM:
TRANSABDOMINAL AND TRANSVAGINAL ULTRASOUND OF PELVIS
DOPPLER ULTRASOUND OF OVARIES
TECHNIQUE: Both transabdominal and transvaginal ultrasound examinations of the
pelvis were performed. Transabdominal technique was performed for
global imaging of the pelvis including uterus, ovaries, adnexal
regions, and pelvic cul-de-sac.
It was necessary to proceed with endovaginal exam following the
transabdominal exam to visualize the ovaries. Color and duplex
Doppler ultrasound was utilized to evaluate blood flow to the
ovaries.

[Series 1: us pelvic complete with transvaginal · 13 of 85 slices shown]
[im 1/85]
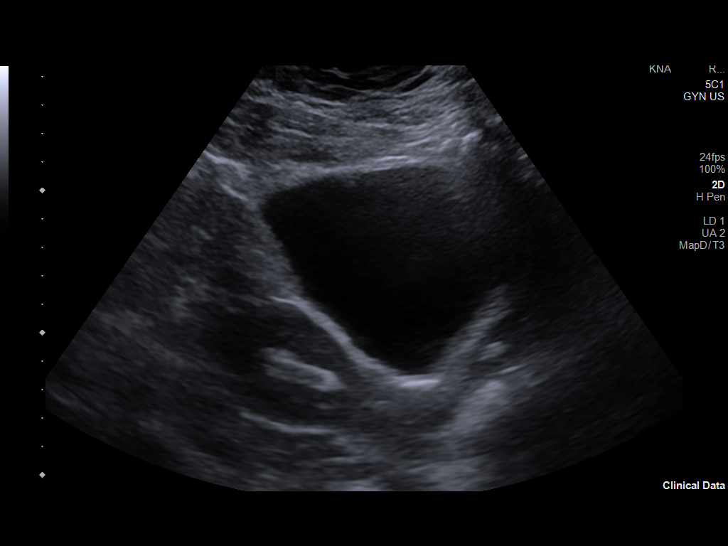
[im 8/85]
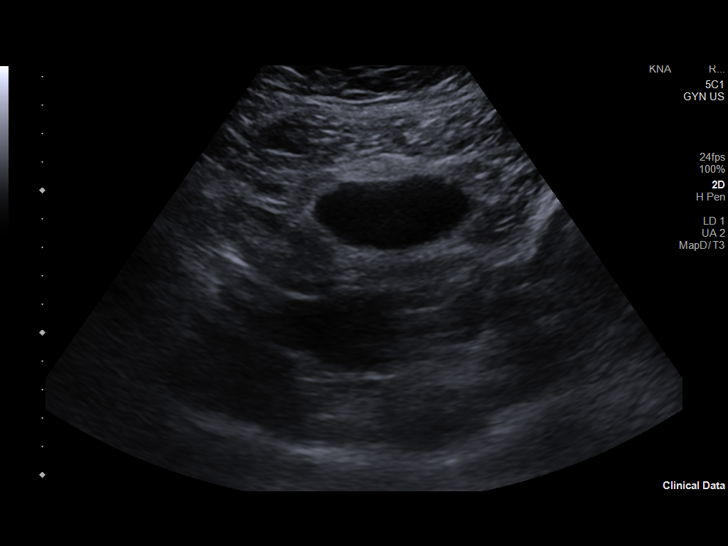
[im 15/85]
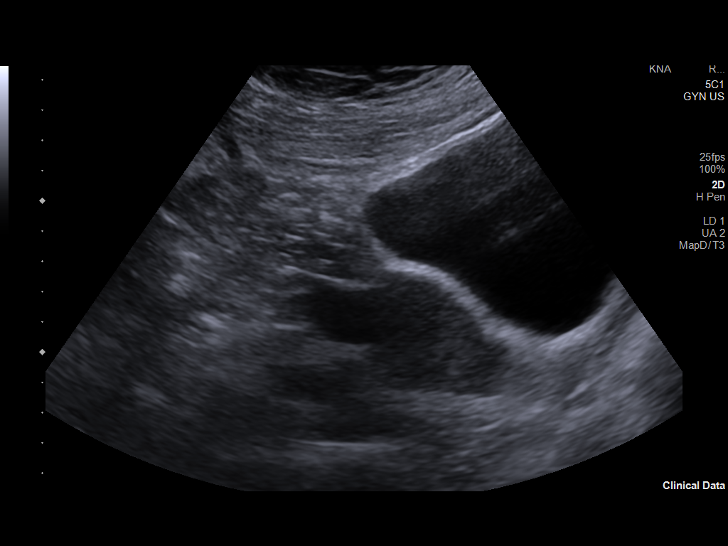
[im 22/85]
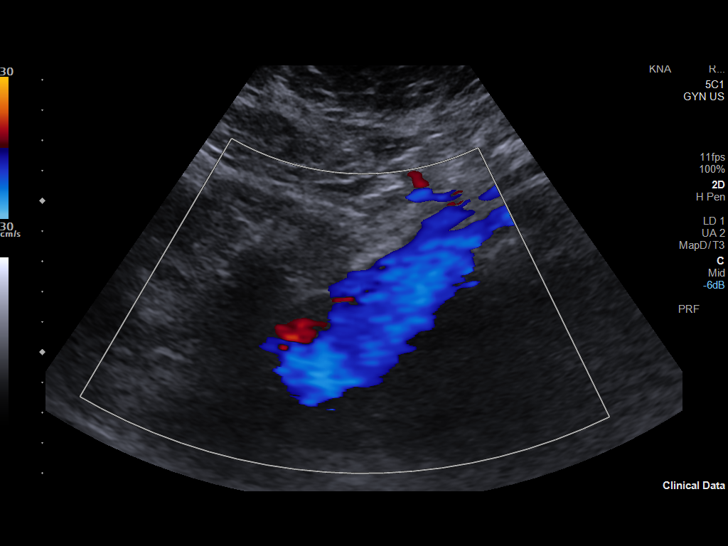
[im 29/85]
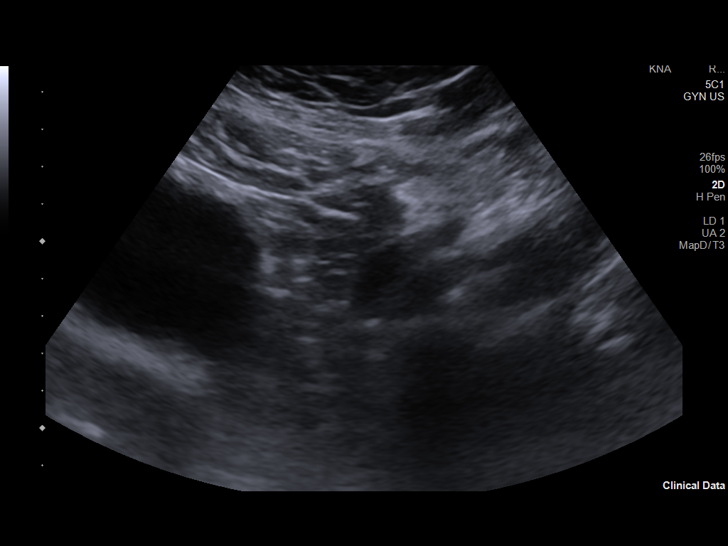
[im 36/85]
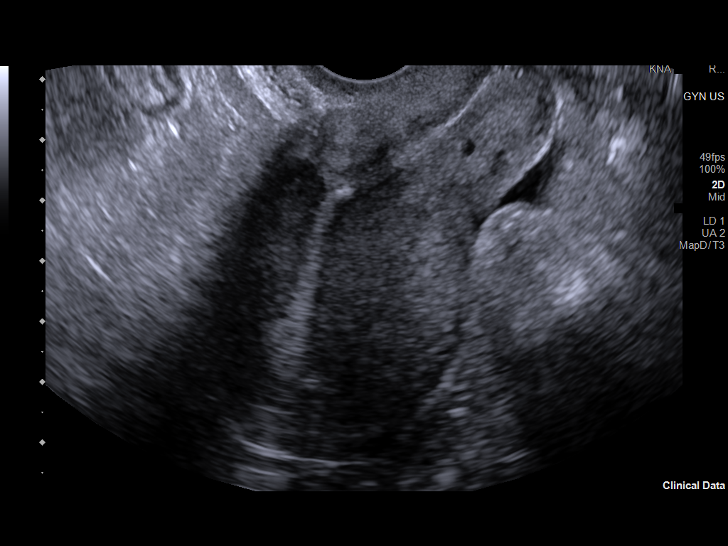
[im 43/85]
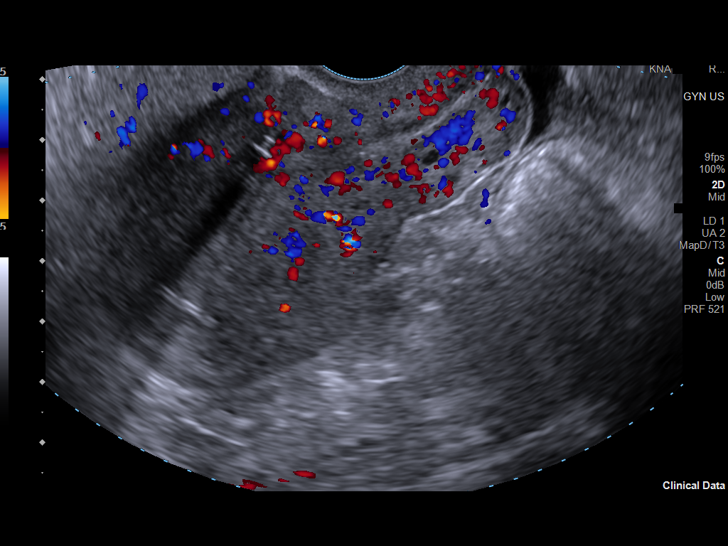
[im 50/85]
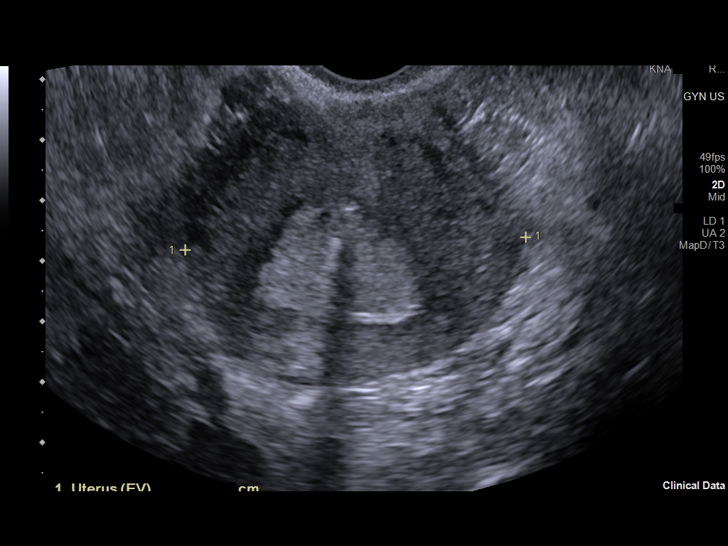
[im 57/85]
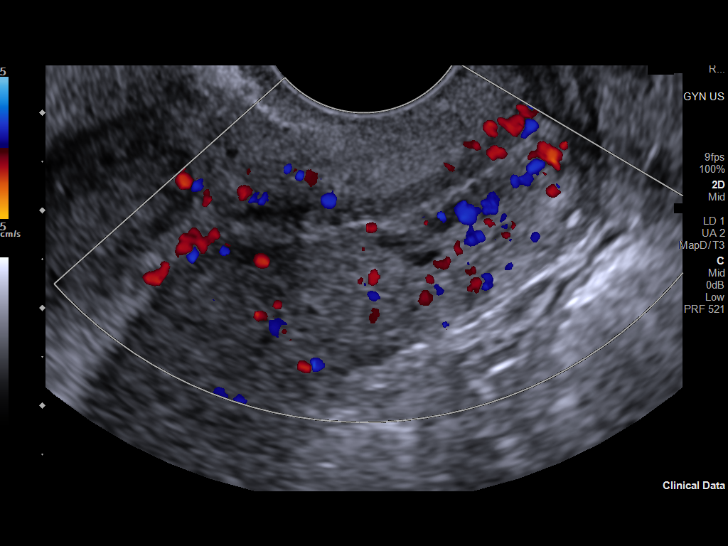
[im 64/85]
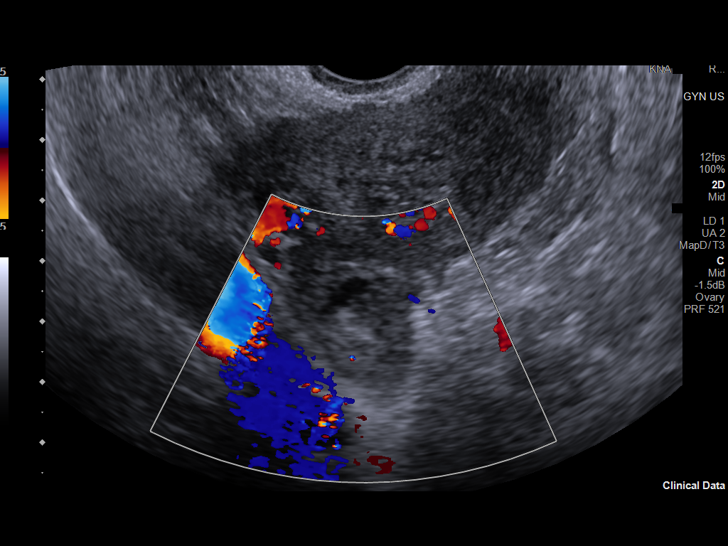
[im 71/85]
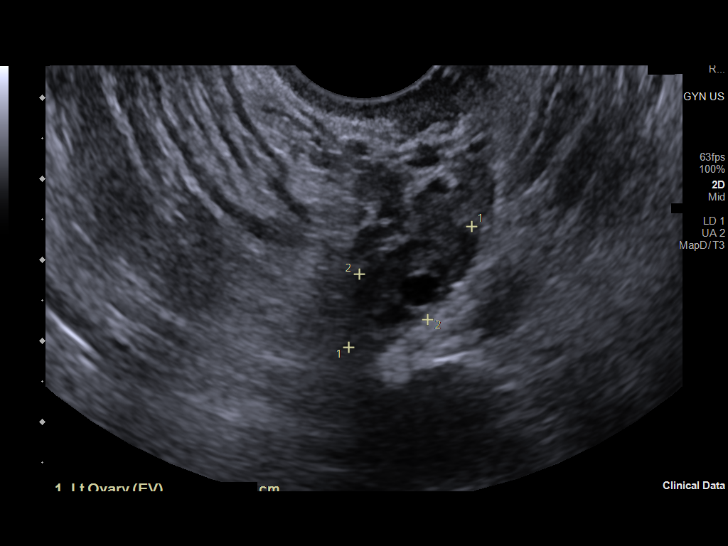
[im 78/85]
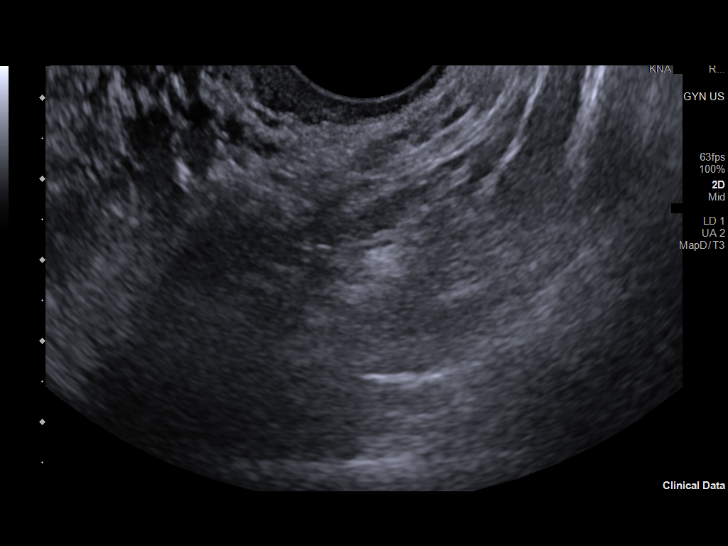
[im 85/85]
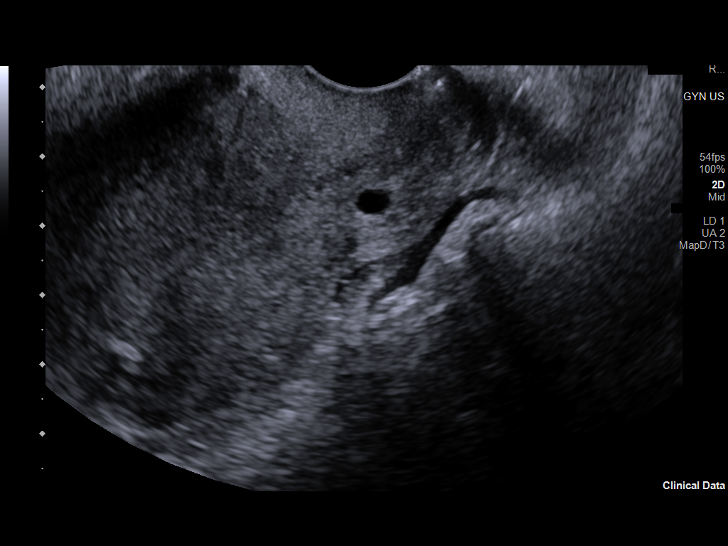

[13 of 25 positions shown; findings below may reference images not displayed]

FINDINGS: Uterus

Measurements: 9 x 4.1 x 6.5 cm = volume: 126.1 mL. No fibroids or
other mass visualized.

Endometrium

Thickness: 9.7. Linear hyperechoic focus seen in the endometrium
suggests IUD. Cervix is closed.

Right ovary

Measurements: 2.3 by 2.1 x 1.7 cm = volume: 4.3 mL. Normal
appearance/no adnexal mass.

Left ovary is difficult to visualize.

Measurements: 2.1 x 1 x 1.2 = volume: 1.4 mL.

Pulsed Doppler evaluation of both ovaries demonstrates demonstrable
vascular flow

Other findings

Small amount of free fluid is seen.
IMPRESSION: Small linear hyperechoic focus seen in the endometrium suggests IUD.

Left ovary is difficult to visualize optimally. Small amount of free
fluid in the pelvis may suggest recent rupture of ovarian cyst or
follicle.

ADDENDUM:
This addendum is in order to correct error in regard to the
technique in the original report. Color Doppler examination was
performed in the current study. Pulsed Doppler evaluation was not
performed in the current study.

*** End of Addendum ***
FINDINGS: Uterus

Measurements: 9 x 4.1 x 6.5 cm = volume: 126.1 mL. No fibroids or
other mass visualized.

Endometrium

Thickness: 9.7. Linear hyperechoic focus seen in the endometrium
suggests IUD. Cervix is closed.

Right ovary

Measurements: 2.3 by 2.1 x 1.7 cm = volume: 4.3 mL. Normal
appearance/no adnexal mass.

Left ovary is difficult to visualize.

Measurements: 2.1 x 1 x 1.2 = volume: 1.4 mL.

Pulsed Doppler evaluation of both ovaries demonstrates demonstrable
vascular flow

Other findings

Small amount of free fluid is seen.
IMPRESSION: Small linear hyperechoic focus seen in the endometrium suggests IUD.

Left ovary is difficult to visualize optimally. Small amount of free
fluid in the pelvis may suggest recent rupture of ovarian cyst or
follicle.

## 2022-05-09 ENCOUNTER — Encounter: Payer: Self-pay | Admitting: Obstetrics and Gynecology

## 2022-05-09 ENCOUNTER — Ambulatory Visit (INDEPENDENT_AMBULATORY_CARE_PROVIDER_SITE_OTHER): Payer: BC Managed Care – PPO | Admitting: Obstetrics and Gynecology

## 2022-05-09 VITALS — BP 140/95 | HR 107 | Ht 66.0 in | Wt 365.0 lb

## 2022-05-09 DIAGNOSIS — Z309 Encounter for contraceptive management, unspecified: Secondary | ICD-10-CM | POA: Diagnosis not present

## 2022-05-09 DIAGNOSIS — N912 Amenorrhea, unspecified: Secondary | ICD-10-CM

## 2022-05-09 NOTE — Progress Notes (Signed)
Sara Carpenter presents to discuss contraception. Previously used IUD by did not like the side effects Currently sexual active without contraception. H/O PCOS, irregular cycles LMP 12/23.  H/O GHTN, no meds afterwards.  H/O c section  Pap smear UTD  PE AF  BP as recorded Lungs clear Heart RRR Abd soft + BS obese  A/P Amenorrhea        Desire for contraception        HTN  Pt unable to give a urine sample for UPT today. Discussed contraception options in sitting of obesity and HTN and PCOS Will avoid combination OCP's at this time Discussed POP's with pt. Pt agreeable Will check BHCG. If negative, progesterone withdraw and then start POP's. U/R/B and back up method reviewed with pt Pt as well advised to see PCP for wt loss and HTN eval/control F/U in 3 months to gage response and BP recheck

## 2022-05-10 ENCOUNTER — Other Ambulatory Visit: Payer: Self-pay | Admitting: *Deleted

## 2022-05-10 LAB — BETA HCG QUANT (REF LAB): hCG Quant: <1 m[IU]/mL

## 2022-05-10 MED ORDER — PROGESTERONE 200 MG PO CAPS: 200.0000 mg | ORAL_CAPSULE | Freq: Every day | ORAL | 0 refills | Status: AC

## 2022-05-10 MED ORDER — SLYND 4 MG PO TABS: 4.0000 mg | ORAL_TABLET | Freq: Every day | ORAL | 11 refills | Status: AC

## 2022-08-06 ENCOUNTER — Ambulatory Visit: Payer: BC Managed Care – PPO | Admitting: Adult Health

## 2023-01-12 ENCOUNTER — Emergency Department (HOSPITAL_BASED_OUTPATIENT_CLINIC_OR_DEPARTMENT_OTHER)
Admission: EM | Admit: 2023-01-12 | Discharge: 2023-01-12 | Disposition: A | Discharge status: Home / Self Care | Payer: BC Managed Care – PPO | Attending: Emergency Medicine | Admitting: Emergency Medicine

## 2023-01-12 ENCOUNTER — Other Ambulatory Visit: Payer: Self-pay

## 2023-01-12 ENCOUNTER — Encounter (HOSPITAL_BASED_OUTPATIENT_CLINIC_OR_DEPARTMENT_OTHER): Payer: Self-pay

## 2023-01-12 DIAGNOSIS — R42 Dizziness and giddiness: Secondary | ICD-10-CM | POA: Diagnosis not present

## 2023-01-12 DIAGNOSIS — N939 Abnormal uterine and vaginal bleeding, unspecified: Secondary | ICD-10-CM | POA: Diagnosis not present

## 2023-01-12 DIAGNOSIS — L409 Psoriasis, unspecified: Secondary | ICD-10-CM | POA: Insufficient documentation

## 2023-01-12 DIAGNOSIS — R531 Weakness: Secondary | ICD-10-CM | POA: Diagnosis not present

## 2023-01-12 LAB — CBC
HCT: 39.9 % (ref 36.0–46.0)
Hemoglobin: 13.4 g/dL (ref 12.0–15.0)
MCH: 29.9 pg (ref 26.0–34.0)
MCHC: 33.6 g/dL (ref 30.0–36.0)
MCV: 89.1 fL (ref 80.0–100.0)
Platelets: 446 K/uL — ABNORMAL HIGH (ref 150–400)
RBC: 4.48 MIL/uL (ref 3.87–5.11)
RDW: 12.8 % (ref 11.5–15.5)
WBC: 13.2 K/uL — ABNORMAL HIGH (ref 4.0–10.5)
nRBC: 0 % (ref 0.0–0.2)

## 2023-01-12 LAB — HCG, SERUM, QUALITATIVE: Preg, Serum: NEGATIVE

## 2023-01-12 MED ORDER — TRIAMCINOLONE ACETONIDE 0.1 % EX CREA: 1.0000 | TOPICAL_CREAM | Freq: Two times a day (BID) | CUTANEOUS | 0 refills | Status: AC

## 2023-01-12 NOTE — ED Provider Notes (Addendum)
[facility] Provider Note   CSN: [id number] Arrival date & time: 01/12/23  1532     History  Chief Complaint  Patient presents with   Vaginal Bleeding    Sara Carpenter is a 32 y.o. female.  Patient with history of polycystic ovarian syndrome.  Followed by [facility] in [place] in the past.  Patient with 3 days of vaginal bleeding had not a period for a long period of time.  Now having heavy vaginal bleeding.  No abdominal pain associated with it.  Patient also has a history of psoriasis.  And has a heavy plaque on her elbow and some on her leg.  Patient states that she has had some lightheadedness maybe some dizziness and feeling weak.  No syncope.       Home Medications Prior to Admission medications   Medication Sig Start Date End Date Taking? Authorizing Provider  Drospirenone (SLYND) 4 MG TABS Take 1 tablet (4 mg total) by mouth daily. 05/10/22 04/11/23  Hermina Staggers, MD  progesterone (PROMETRIUM) 200 MG capsule Take 1 capsule (200 mg total) by mouth daily for 10 days. 05/10/22 05/20/22  Hermina Staggers, MD      Allergies    Carbamazepine    Review of Systems   Review of Systems  Constitutional:  Negative for chills and fever.  HENT:  Negative for ear pain and sore throat.   Eyes:  Negative for pain and visual disturbance.  Respiratory:  Negative for cough and shortness of breath.   Cardiovascular:  Negative for chest pain and palpitations.  Gastrointestinal:  Negative for abdominal pain and vomiting.  Genitourinary:  Positive for vaginal bleeding. Negative for dysuria and hematuria.  Musculoskeletal:  Negative for arthralgias and back pain.  Skin:  Positive for rash. Negative for color change.  Neurological:  Negative for seizures and syncope.  All other systems reviewed and are negative.   Physical Exam Updated Vital Signs BP (!) 168/93   Pulse 92   Temp 98.6 F (37 C)   Resp 20   Ht 1.676 m  (5\' 6" )   Wt (!) 165 kg   LMP 01/09/2023 (Exact Date)   SpO2 99%   BMI 58.71 kg/m  Physical Exam Vitals and nursing note reviewed.  Constitutional:      General: She is not in acute distress.    Appearance: Normal appearance. She is well-developed.  HENT:     Head: Normocephalic and atraumatic.  Eyes:     Conjunctiva/sclera: Conjunctivae normal.  Cardiovascular:     Rate and Rhythm: Normal rate and regular rhythm.     Heart sounds: No murmur heard. Pulmonary:     Effort: Pulmonary effort is normal. No respiratory distress.     Breath sounds: Normal breath sounds.  Abdominal:     General: There is no distension.     Palpations: Abdomen is soft.     Tenderness: There is no abdominal tenderness. There is no guarding.  Musculoskeletal:        General: No swelling.     Cervical back: Neck supple.  Skin:    General: Skin is warm and dry.     Capillary Refill: Capillary refill takes less than 2 seconds.     Findings: Rash present.     Comments: Left elbow with large plaque psoriasis.  Right lower extremity with some scattered psoriasis outbreak.  Neurological:     Mental Status: She is alert.  Psychiatric:  Mood and Affect: Mood normal.     ED Results / Procedures / Treatments   Labs (all labs ordered are listed, but only abnormal results are displayed) Labs Reviewed  CBC - Abnormal; Notable for the following components:      Result Value   WBC 13.2 (*)    Platelets 446 (*)    All other components within normal limits  HCG, SERUM, QUALITATIVE    EKG None  Radiology No results found.  Procedures Procedures    Medications Ordered in ED Medications - No data to display  ED Course/ Medical Decision Making/ A&P                                 Medical Decision Making Amount and/or Complexity of Data Reviewed Labs: ordered.   CBC white count 13.2 hemoglobin 13.4 platelets 446.  Pregnancy test negative.   No significant blood loss only been 3 days  worth of bleeding.  Will have patient contact [facility] on Monday.  Patient also has some evidence of psoriasis.  Will provide a cream for that.    Final Clinical Impression(s) / ED Diagnoses Final diagnoses:  Vaginal bleeding  Psoriasis    Rx / DC Orders ED Discharge Orders     None         Vanetta Mulders, MD 01/12/23 2110    Vanetta Mulders, MD 01/12/23 2110

## 2023-01-12 NOTE — Discharge Instructions (Signed)
Use the Kenalog cream for the psoriasis.  Contact [facility].  Follow-up for any new or worse symptoms.

## 2023-01-12 NOTE — ED Triage Notes (Signed)
The patient is having heavy vaginal bleeding for three days. She is feeling dizzy and weak.

## 2023-03-21 DIAGNOSIS — L819 Disorder of pigmentation, unspecified: Secondary | ICD-10-CM | POA: Diagnosis not present

## 2023-03-21 DIAGNOSIS — L400 Psoriasis vulgaris: Secondary | ICD-10-CM | POA: Diagnosis not present

## 2023-03-21 DIAGNOSIS — Z79899 Other long term (current) drug therapy: Secondary | ICD-10-CM | POA: Diagnosis not present

## 2023-05-01 DIAGNOSIS — Z79899 Other long term (current) drug therapy: Secondary | ICD-10-CM | POA: Diagnosis not present

## 2023-05-09 DIAGNOSIS — L400 Psoriasis vulgaris: Secondary | ICD-10-CM | POA: Diagnosis not present

## 2023-05-09 DIAGNOSIS — Z79899 Other long term (current) drug therapy: Secondary | ICD-10-CM | POA: Diagnosis not present

## 2023-05-22 ENCOUNTER — Ambulatory Visit: Admitting: Women's Health

## 2023-06-05 ENCOUNTER — Ambulatory Visit: Admitting: Women's Health

## 2023-10-26 ENCOUNTER — Emergency Department (HOSPITAL_COMMUNITY)
Admission: EM | Admit: 2023-10-26 | Discharge: 2023-10-26 | Disposition: A | Discharge status: Home / Self Care | Attending: Emergency Medicine | Admitting: Emergency Medicine

## 2023-10-26 ENCOUNTER — Other Ambulatory Visit: Payer: Self-pay

## 2023-10-26 ENCOUNTER — Encounter (HOSPITAL_COMMUNITY): Payer: Self-pay

## 2023-10-26 DIAGNOSIS — J209 Acute bronchitis, unspecified: Secondary | ICD-10-CM | POA: Insufficient documentation

## 2023-10-26 DIAGNOSIS — J40 Bronchitis, not specified as acute or chronic: Secondary | ICD-10-CM

## 2023-10-26 DIAGNOSIS — R059 Cough, unspecified: Secondary | ICD-10-CM | POA: Diagnosis present

## 2023-10-26 DIAGNOSIS — J449 Chronic obstructive pulmonary disease, unspecified: Secondary | ICD-10-CM | POA: Insufficient documentation

## 2023-10-26 DIAGNOSIS — H66002 Acute suppurative otitis media without spontaneous rupture of ear drum, left ear: Secondary | ICD-10-CM | POA: Insufficient documentation

## 2023-10-26 LAB — RESP PANEL BY RT-PCR (RSV, FLU A&B, COVID)  RVPGX2
Influenza A by PCR: NEGATIVE
Influenza B by PCR: NEGATIVE
Resp Syncytial Virus by PCR: NEGATIVE
SARS Coronavirus 2 by RT PCR: NEGATIVE

## 2023-10-26 MED ORDER — ALBUTEROL SULFATE HFA 108 (90 BASE) MCG/ACT IN AERS
INHALATION_SPRAY | RESPIRATORY_TRACT | Status: AC
  Administered 2023-10-26: 2 via RESPIRATORY_TRACT
  Filled 2023-10-26: qty 6.7

## 2023-10-26 MED ORDER — ALBUTEROL SULFATE HFA 108 (90 BASE) MCG/ACT IN AERS: 2.0000 | INHALATION_SPRAY | Freq: Four times a day (QID) | RESPIRATORY_TRACT | Status: DC | PRN

## 2023-10-26 MED ORDER — IPRATROPIUM-ALBUTEROL 0.5-2.5 (3) MG/3ML IN SOLN
3.0000 mL | Freq: Once | RESPIRATORY_TRACT | Status: AC
  Administered 2023-10-26: 3 mL via RESPIRATORY_TRACT
  Filled 2023-10-26: qty 3

## 2023-10-26 MED ORDER — AMOXICILLIN 875 MG PO TABS
875.0000 mg | ORAL_TABLET | Freq: Two times a day (BID) | ORAL | 0 refills | Status: AC
Start: 2023-10-26 — End: 2023-11-02

## 2023-10-26 MED ORDER — BENZONATATE 100 MG PO CAPS: 100.0000 mg | ORAL_CAPSULE | Freq: Three times a day (TID) | ORAL | 0 refills | Status: AC

## 2023-10-26 MED ORDER — ALBUTEROL SULFATE HFA 108 (90 BASE) MCG/ACT IN AERS: 2.0000 | INHALATION_SPRAY | RESPIRATORY_TRACT | 0 refills | Status: AC | PRN

## 2023-10-26 NOTE — ED Provider Notes (Signed)
 [place] EMERGENCY DEPARTMENT AT [facility] Provider Note   CSN: [id number] Arrival date & time: 10/26/23  9055     Patient presents with: Cough   Sara Carpenter is a 33 y.o. female.  She has history of obesity, bipolar disorder, PCOS.  Presents to ER for evaluation of cough, congestion, body aches, sore throat and wheezing.  She states her chest is burning when she coughs no chest pain otherwise, no back pain, no nausea or vomiting.  She states her symptoms started 2 days ago.  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  She states she feels like she has the past when she has been diagnosed with bronchitis.  She states when this happens she has wheezing and this usually is helped with an albuterol  inhaler.  She has no formal diagnosis of asthma COPD.  She does not smoke cigarettes but does smoke marijuana daily.  She denies any known fevers, denies nausea or vomiting.    Cough      Prior to Admission medications   Medication Sig Start Date End Date Taking? Authorizing Provider  albuterol  (VENTOLIN  HFA) 108 (90 Base) MCG/ACT inhaler Inhale 2 puffs into the lungs every 4 (four) hours as needed for wheezing or shortness of breath. 10/26/23  Yes Ansleigh Safer A, PA-C  amoxicillin  (AMOXIL ) 875 MG tablet Take 1 tablet (875 mg total) by mouth 2 (two) times daily for 7 days. 10/26/23 11/02/23 Yes Sharlee Rufino A, PA-C  benzonatate (TESSALON) 100 MG capsule Take 1 capsule (100 mg total) by mouth every 8 (eight) hours. 10/26/23  Yes Verdie Barrows A, PA-C  progesterone  (PROMETRIUM ) 200 MG capsule Take 1 capsule (200 mg total) by mouth daily for 10 days. 05/10/22 05/20/22  Ervin, Michael L, MD  triamcinolone  cream (KENALOG ) 0.1 % Apply 1 Application topically 2 (two) times daily. 01/12/23   Zackowski, Scott,  MD    Allergies: Carbamazepine     Review of Systems  Respiratory:  Positive for cough.     Updated Vital Signs BP 115/65 (BP Location: Right Arm)   Pulse 88   Temp 99.4 F (37.4 C) (Oral)   Resp 20   Ht 5' 6 (1.676 m)   Wt (!) 158.8 kg   SpO2 98%   BMI 56.49 kg/m   Physical Exam Vitals and nursing note reviewed.  Constitutional:      General: She is not in acute distress.    Appearance: She is well-developed.  HENT:     Head: Normocephalic and atraumatic.     Right Ear: Tympanic membrane is injected.     Left Ear: A middle ear effusion is present. Tympanic membrane is erythematous and bulging.     Mouth/Throat:     Lips: Pink.     Mouth: Mucous membranes are moist.     Pharynx: Uvula midline. Posterior oropharyngeal erythema present. No pharyngeal swelling, oropharyngeal exudate or uvula swelling.     Tonsils: No tonsillar exudate or tonsillar abscesses. 0 on the right. 0 on the left.  Eyes:     Extraocular Movements: Extraocular movements intact.     Conjunctiva/sclera: Conjunctivae normal.     Pupils: Pupils are equal, round, and reactive to light.  Cardiovascular:     Rate and Rhythm: Normal rate and regular rhythm.     Heart sounds: No  murmur heard. Pulmonary:     Effort: Pulmonary effort is normal. No respiratory distress.     Breath sounds: No stridor. No rhonchi or rales.     Comments: Diffuse scattered wheezing  Abdominal:     Palpations: Abdomen is soft.     Tenderness: There is no abdominal tenderness.  Musculoskeletal:        General: No swelling.     Cervical back: Neck supple.  Skin:    General: Skin is warm and dry.     Capillary Refill: Capillary refill takes less than 2 seconds.  Neurological:     General: No focal deficit present.     Mental Status: She is alert and oriented to person, place, and time.  Psychiatric:        Mood and Affect: Mood normal.     (all labs ordered are listed, but only abnormal results are displayed) Labs  Reviewed  RESP PANEL BY RT-PCR (RSV, FLU A&B, COVID)  RVPGX2    EKG: None  Radiology: No results found.   Procedures   Medications Ordered in the ED  albuterol  (VENTOLIN  HFA) 108 (90 Base) MCG/ACT inhaler 2 puff (2 puffs Inhalation Given 10/26/23 1200)  ipratropium-albuterol  (DUONEB) 0.5-2.5 (3) MG/3ML nebulizer solution 3 mL (3 mLs Nebulization Given 10/26/23 1151)                                    Medical Decision Making This patient presents to the ED for concern of cough, congestion, left ear pain, this involves an extensive number of treatment options, and is a complaint that carries with it a high risk of complications and morbidity.  The differential diagnosis includes viral illness, otitis media, otitis externa, pneumonia, bronchitis, asthma exacerbation, other   Co morbidities that complicate the patient evaluation  Bolar disorder, COPD   Additional history obtained:  Additional history obtained from EMR External records from outside source obtained and reviewed including previous notes and labs   Lab Tests:  I Ordered, and personally interpreted labs.  The pertinent results include: Negative COVID flu and RSV     Problem List / ED Course / Critical interventions / Medication management  Cough, congestion, wheezing with left ear pain-ongoing for 2 days, negative for COVID flu and RSV.  Does have left acute otitis media we will treat with amoxicillin .  She had improvement of her wheezing though not full resolution after breathing treatment will send home with albuterol  inhaler.  Feels more likely viral bronchitis.  Unsure if she has some underlying asthma though she has never been diagnosed.  I encouraged her to stop smoking as this is likely exacerbating her symptoms.  I considered possible steroids, but she does have reported history of bipolar disorder , do not feel the risk of potentially triggering some mania with the benefits of trying steroids for  bronchitis is not supported by evidence.  Patient is at home with albuterol  inhaler, antibiotics for left ear infection and given strict return precautions and follow-up. Did have sore throat, considered strep test but patient has all negative [facility] criteria, do not feel this is indicated at this time. I ordered medication including albuterol  for wheezing Reevaluation of the patient after these medicines showed that the patient improved I have reviewed the patients home medicines and have made adjustments as needed    Amount and/or Complexity of Data Reviewed Labs: ordered. Decision-making details documented in ED Course.  Risk Prescription drug management.        Final diagnoses:  Bronchitis  Non-recurrent acute suppurative otitis media of left ear without spontaneous rupture of tympanic membrane    ED Discharge Orders          Ordered    amoxicillin  (AMOXIL ) 875 MG tablet  2 times daily        10/26/23 1138    albuterol  (VENTOLIN  HFA) 108 (90 Base) MCG/ACT inhaler  Every 4 hours PRN        10/26/23 1145    benzonatate (TESSALON) 100 MG capsule  Every 8 hours        10/26/23 [address] A, PA-C 10/26/23 1222    Charlyn Sora, MD 10/29/23 2150

## 2023-10-26 NOTE — ED Triage Notes (Signed)
 Pt to er, pt states that a couple of days ago she started having a sore throat and a burning in her chest, states that since then it has gotten worse, pt has cough, body aches, denies fever.

## 2023-10-26 NOTE — Discharge Instructions (Addendum)
 To take care of you today.  You were seen for cough and wheezing for the past couple of days.  You were negative for COVID flu and RSV.  Your symptoms are likely due to a virus but you also have an ear infection so we will treat with antibiotics as well.  Since you have had success with the albuterol  before we will treat you with an inhaler as needed at home, cough medication.  Drink plenty of fluids and rest.  Follow-up closely with your PCP.  Recommend stopping smoking. [facility] Primary Care Doctor List    Rollene Pesa, MD. Specialty: [facility] Medicine Contact information: [address]  [place] [address]  [phone]   Glendia Fielding, MD. Specialty: [facility] Medicine Contact information: [address]  [place] [address]  [phone]   Benita Outhouse, MD Specialty: Internal Medicine Contact information: [address] [place] [address]  [phone]   Darlyn Hurst, MD. Specialty: Internal Medicine Contact information: [address]  [place] [address]  [phone]    [facility] (Dr. Luke) Specialty: Family Medicine Contact information: [address]  [place] [address]  [phone]   Garnette Lolling, MD. Specialty: [facility] Medicine Contact information: [address]  [address]  [place] [address]  [phone]   Gaither Langton, MD. Specialty: Internal Medicine Contact information: [address]  [address]  [place] [address]  [phone]    [facility]  [address] [place], [place] [address] [phone]  Services [facility] offers a variety of basic health services.  Services include but are not limited to: Blood pressure checks  Heart rate checks  Blood sugar checks  Urine analysis  Rapid strep tests  Pregnancy tests.  Health education and referrals  People needing more complex services will be directed to a  physician online. Using these virtual visits, doctors can evaluate and prescribe medicine and treatments. There will be no medication on-site, though [facility] will help patients fill their prescriptions at little to no cost.   For More information please go to: [email or web address]

## 2023-10-26 NOTE — ED Notes (Addendum)
 Pt/family received d/c paperwork at this time. After going over the paperwork any questions, comments, or concerns were answered to the best of this nurse's knowledge. The pt/family verbally acknowledged the teachings/instructions.    Respiratory reviewed on how to use the inhaler with the spacer. During d/c, this nurse asked if they had any questions about the inhaler/spacer. Pt declined.

## 2023-10-31 ENCOUNTER — Emergency Department (HOSPITAL_BASED_OUTPATIENT_CLINIC_OR_DEPARTMENT_OTHER)
Admission: EM | Admit: 2023-10-31 | Discharge: 2023-10-31 | Disposition: A | Discharge status: Home / Self Care | Attending: Emergency Medicine | Admitting: Emergency Medicine

## 2023-10-31 ENCOUNTER — Encounter (HOSPITAL_BASED_OUTPATIENT_CLINIC_OR_DEPARTMENT_OTHER): Payer: Self-pay

## 2023-10-31 ENCOUNTER — Emergency Department (HOSPITAL_BASED_OUTPATIENT_CLINIC_OR_DEPARTMENT_OTHER)

## 2023-10-31 ENCOUNTER — Other Ambulatory Visit: Payer: Self-pay

## 2023-10-31 DIAGNOSIS — Z87891 Personal history of nicotine dependence: Secondary | ICD-10-CM | POA: Diagnosis not present

## 2023-10-31 DIAGNOSIS — R059 Cough, unspecified: Secondary | ICD-10-CM | POA: Insufficient documentation

## 2023-10-31 DIAGNOSIS — J069 Acute upper respiratory infection, unspecified: Secondary | ICD-10-CM | POA: Insufficient documentation

## 2023-10-31 DIAGNOSIS — R0602 Shortness of breath: Secondary | ICD-10-CM | POA: Diagnosis not present

## 2023-10-31 DIAGNOSIS — J4541 Moderate persistent asthma with (acute) exacerbation: Secondary | ICD-10-CM | POA: Diagnosis not present

## 2023-10-31 DIAGNOSIS — R0989 Other specified symptoms and signs involving the circulatory and respiratory systems: Secondary | ICD-10-CM | POA: Diagnosis not present

## 2023-10-31 LAB — CBC
HCT: 41.2 % (ref 36.0–46.0)
Hemoglobin: 13.8 g/dL (ref 12.0–15.0)
MCH: 29.7 pg (ref 26.0–34.0)
MCHC: 33.5 g/dL (ref 30.0–36.0)
MCV: 88.8 fL (ref 80.0–100.0)
Platelets: 486 K/uL — ABNORMAL HIGH (ref 150–400)
RBC: 4.64 MIL/uL (ref 3.87–5.11)
RDW: 13 % (ref 11.5–15.5)
WBC: 15.4 K/uL — ABNORMAL HIGH (ref 4.0–10.5)
nRBC: 0 % (ref 0.0–0.2)

## 2023-10-31 LAB — BASIC METABOLIC PANEL WITH GFR
Anion gap: 14 (ref 5–15)
BUN: 13 mg/dL (ref 6–20)
CO2: 23 mmol/L (ref 22–32)
Calcium: 9.6 mg/dL (ref 8.9–10.3)
Chloride: 103 mmol/L (ref 98–111)
Creatinine, Ser: 0.92 mg/dL (ref 0.44–1.00)
GFR, Estimated: >60 mL/min (ref >60)
Glucose, Bld: 92 mg/dL (ref 70–99)
Potassium: 4.2 mmol/L (ref 3.5–5.1)
Sodium: 140 mmol/L (ref 135–145)

## 2023-10-31 LAB — RESP PANEL BY RT-PCR (RSV, FLU A&B, COVID)  RVPGX2
Influenza A by PCR: NEGATIVE
Influenza B by PCR: NEGATIVE
Resp Syncytial Virus by PCR: NEGATIVE
SARS Coronavirus 2 by RT PCR: NEGATIVE

## 2023-10-31 LAB — PREGNANCY, URINE: Preg Test, Ur: NEGATIVE

## 2023-10-31 MED ORDER — PREDNISONE 50 MG PO TABS
60.0000 mg | ORAL_TABLET | Freq: Once | ORAL | Status: AC
  Administered 2023-10-31: 60 mg via ORAL
  Filled 2023-10-31: qty 1

## 2023-10-31 MED ORDER — PREDNISONE 10 MG PO TABS: 40.0000 mg | ORAL_TABLET | Freq: Every day | ORAL | 0 refills | Status: AC

## 2023-10-31 MED ORDER — IPRATROPIUM-ALBUTEROL 0.5-2.5 (3) MG/3ML IN SOLN
3.0000 mL | Freq: Once | RESPIRATORY_TRACT | Status: AC
  Administered 2023-10-31: 3 mL via RESPIRATORY_TRACT
  Filled 2023-10-31: qty 3

## 2023-10-31 NOTE — Discharge Instructions (Signed)
 Chest x-ray here negative for pneumonia respiratory panel negative.  Continue antibiotic she was taking for the ear infection but it does appear that that is cleared up.  Continue to use albuterol  inhaler 2 puffs every 6 hours.  And first dose of steroids given here tonight start tomorrow with the 40 mg of prednisone  daily for the next 5 days.  Return for any new or worse symptoms.  Work note provided.

## 2023-10-31 NOTE — ED Notes (Signed)
   10/31/23 1556  Respiratory Assessment  $ RT Protocol Assessment  Yes  Assessment Type Pre-treatment  Respiratory Pattern Regular;Labored;Dyspnea at rest;Symmetrical  Chest Assessment Chest expansion symmetrical  Cough Non-productive  Bilateral Breath Sounds Expiratory wheezes  Oxygen  Therapy/Pulse Ox  SpO2 100 %  O2 Device Room Air   Seen in lobby before triage, using inhaler every ~45mins with no relief.

## 2023-10-31 NOTE — ED Notes (Signed)
Dr. Zackowski at bedside  

## 2023-10-31 NOTE — ED Provider Notes (Addendum)
 [place] EMERGENCY DEPARTMENT AT [facility] Provider Note   CSN: [id number] Arrival date & time: 10/31/23  1548     Patient presents with: Shortness of Breath   Sara Carpenter is a 33 y.o. female.   Patient seen at any pain emergency department on October 11 for similar symptoms.  Patient's been sick with cough and congestion for about a week now.  Patient had negative respiratory panel there in any pattern.  She has been using an albuterol  inhaler.  Has a history of asthma.  Has been feeling as if she is having a lot of wheezing.  She is also on antibiotic for left ear infection.  Patient's temp here 98.5 pulse 103 respirations 22 blood pressure 113/72 oxygen  sats 98% on room air.  Patient was given albuterol  nebulizer here and feels much better after that.  Past medical significant for polycystic ovarian syndrome obesity bipolar disorder.  History of migraine headaches.  Former smoker quit in 2012.       Prior to Admission medications   Medication Sig Start Date End Date Taking? Authorizing Provider  albuterol  (VENTOLIN  HFA) 108 (90 Base) MCG/ACT inhaler Inhale 2 puffs into the lungs every 4 (four) hours as needed for wheezing or shortness of breath. 10/26/23   Suellen Cantor A, PA-C  amoxicillin  (AMOXIL ) 875 MG tablet Take 1 tablet (875 mg total) by mouth 2 (two) times daily for 7 days. 10/26/23 11/02/23  Suellen Cantor A, PA-C  benzonatate (TESSALON) 100 MG capsule Take 1 capsule (100 mg total) by mouth every 8 (eight) hours. 10/26/23   Suellen Cantor A, PA-C  progesterone  (PROMETRIUM ) 200 MG capsule Take 1 capsule (200 mg total) by mouth daily for 10 days. 05/10/22 05/20/22  Ervin, Michael L, MD  triamcinolone  cream (KENALOG ) 0.1 % Apply 1 Application topically 2 (two) times daily. 01/12/23   Elyanna Wallick, MD    Allergies: Carbamazepine     Review of Systems  Constitutional:  Negative for chills and fever.  HENT:  Negative for ear pain and sore throat.    Eyes:  Negative for pain and visual disturbance.  Respiratory:  Positive for cough, shortness of breath and wheezing.   Cardiovascular:  Negative for chest pain and palpitations.  Gastrointestinal:  Negative for abdominal pain and vomiting.  Genitourinary:  Negative for dysuria and hematuria.  Musculoskeletal:  Negative for arthralgias and back pain.  Skin:  Negative for color change and rash.  Neurological:  Negative for seizures and syncope.  All other systems reviewed and are negative.   Updated Vital Signs BP 113/72 (BP Location: Right Arm)   Pulse (!) 103   Temp 98.5 F (36.9 C) (Oral)   Resp (!) 22   Ht 1.676 m (5' 6)   Wt (!) 158.8 kg   LMP 09/25/2023 (Approximate)   SpO2 98%   BMI 56.49 kg/m   Physical Exam Vitals and nursing note reviewed.  Constitutional:      General: She is not in acute distress.    Appearance: Normal appearance. She is well-developed. She is obese. She is not ill-appearing.  HENT:     Head: Normocephalic and atraumatic.     Right Ear: Tympanic membrane and ear canal normal.     Left Ear: Tympanic membrane and ear canal normal.  Eyes:     Conjunctiva/sclera: Conjunctivae normal.     Pupils: Pupils are equal, round, and reactive to light.  Cardiovascular:     Rate and Rhythm: Normal rate and regular rhythm.  Heart sounds: No murmur heard. Pulmonary:     Effort: Pulmonary effort is normal. No respiratory distress.     Breath sounds: No stridor. Wheezing present. No rhonchi or rales.  Abdominal:     Palpations: Abdomen is soft.     Tenderness: There is no abdominal tenderness.  Musculoskeletal:        General: No swelling.     Cervical back: Neck supple.  Skin:    General: Skin is warm and dry.     Capillary Refill: Capillary refill takes less than 2 seconds.  Neurological:     General: No focal deficit present.     Mental Status: She is alert and oriented to person, place, and time.  Psychiatric:        Mood and Affect: Mood  normal.     (all labs ordered are listed, but only abnormal results are displayed) Labs Reviewed  CBC - Abnormal; Notable for the following components:      Result Value   WBC 15.4 (*)    Platelets 486 (*)    All other components within normal limits  RESP PANEL BY RT-PCR (RSV, FLU A&B, COVID)  RVPGX2  BASIC METABOLIC PANEL WITH GFR  PREGNANCY, URINE    EKG: EKG Interpretation Date/Time:  Thursday October 31 2023 16:16:50 EDT Ventricular Rate:  89 PR Interval:  116 QRS Duration:  90 QT Interval:  329 QTC Calculation: 401 R Axis:   64  Text Interpretation: Sinus rhythm Borderline short PR interval Borderline repolarization abnormality Baseline wander in lead(s) V1 V4 V5 No significant change since last tracing Confirmed by Zari Cly [phone]) on 10/31/2023 6:34:31 PM  Radiology: [facility] Chest 2 View Result Date: 10/31/2023 EXAM: 2 VIEW(S) XRAY OF THE CHEST 10/31/2023 04:36:17 PM COMPARISON: 01/09/2017 CLINICAL HISTORY: shortness of breath. Table formatting from the original note was not included.; Pt states that she has been sick with cough and congestion for about a week now. FINDINGS: LUNGS AND PLEURA: No focal pulmonary opacity. No pulmonary edema. No pleural effusion. No pneumothorax. HEART AND MEDIASTINUM: No acute abnormality of the cardiac and mediastinal silhouettes. BONES AND SOFT TISSUES: No acute osseous abnormality. IMPRESSION: 1. No acute cardiopulmonary disease. Electronically signed by: Lynwood Seip MD 10/31/2023 04:41 PM EDT RP Workstation: [id number]     Procedures   Medications Ordered in the ED  ipratropium-albuterol  (DUONEB) 0.5-2.5 (3) MG/3ML nebulizer solution 3 mL (3 mLs Nebulization Given 10/31/23 1737)  predniSONE  (DELTASONE ) tablet 60 mg (60 mg Oral Given 10/31/23 1849)                                    Medical Decision Making Amount and/or Complexity of Data Reviewed Labs: ordered. Radiology: ordered.  Risk Prescription drug  management.   Patient metabolic panel is normal renal function is normal.  CBC white count 15.4 hemoglobin 13.8 platelets 486.  Pregnancy test negative.  Respiratory panel negative.  2 view chest x-ray no acute process.  Patient still with some wheezing.  But she feels much better.  Will give a dose of steroids here and give her a 5-day course of steroids.  Chest x-ray negative for pneumonia.  Bilateral ears are normal no evidence of otitis media or externa.  She will continue her antibiotics and do the prednisone  and continue to use her albuterol  inhaler 2 puffs every 6 hours.  And follow-up with her primary care doctor.    Final diagnoses:  Moderate persistent asthma with exacerbation  Upper respiratory tract infection, unspecified type    ED Discharge Orders     None          Geraldene Hamilton, MD 10/31/23 1851    Geraldene Hamilton, MD 10/31/23 [phone]

## 2023-10-31 NOTE — ED Triage Notes (Signed)
 Pt states that she has been sick with cough and congestion for about a week now. States that she was tested at her PCP and was negative. States that she is having to use her rescue inhaler. States that she is also on an antibiotic for an ear infection.

## 2023-10-31 NOTE — ED Notes (Signed)
 Pt transferred from WR to ED RM 6. Assuming pt care at this time.
